# Patient Record
Sex: Male | Born: 1935
Health system: Southern US, Community
[De-identification: ages and names within clinical notes are randomized; demographics above are authoritative.]

## PROBLEM LIST (undated history)

## (undated) DIAGNOSIS — IMO0001 Reserved for inherently not codable concepts without codable children: Secondary | ICD-10-CM

## (undated) DIAGNOSIS — K219 Gastro-esophageal reflux disease without esophagitis: Secondary | ICD-10-CM

## (undated) DIAGNOSIS — K648 Other hemorrhoids: Secondary | ICD-10-CM

## (undated) DIAGNOSIS — R351 Nocturia: Secondary | ICD-10-CM

## (undated) DIAGNOSIS — H6120 Impacted cerumen, unspecified ear: Secondary | ICD-10-CM

## (undated) DIAGNOSIS — K120 Recurrent oral aphthae: Secondary | ICD-10-CM

## (undated) DIAGNOSIS — Z87898 Personal history of other specified conditions: Secondary | ICD-10-CM

## (undated) DIAGNOSIS — K130 Diseases of lips: Secondary | ICD-10-CM

## (undated) DIAGNOSIS — G4730 Sleep apnea, unspecified: Secondary | ICD-10-CM

## (undated) DIAGNOSIS — Z8619 Personal history of other infectious and parasitic diseases: Secondary | ICD-10-CM

## (undated) DIAGNOSIS — Z8719 Personal history of other diseases of the digestive system: Secondary | ICD-10-CM

## (undated) DIAGNOSIS — C801 Malignant (primary) neoplasm, unspecified: Secondary | ICD-10-CM

## (undated) DIAGNOSIS — M810 Age-related osteoporosis without current pathological fracture: Secondary | ICD-10-CM

## (undated) DIAGNOSIS — K5730 Diverticulosis of large intestine without perforation or abscess without bleeding: Secondary | ICD-10-CM

## (undated) DIAGNOSIS — Z86010 Personal history of colonic polyps: Secondary | ICD-10-CM

## (undated) DIAGNOSIS — E785 Hyperlipidemia, unspecified: Secondary | ICD-10-CM

## (undated) HISTORY — DX: Personal history of other specified conditions: Z87.898

## (undated) HISTORY — DX: Gastro-esophageal reflux disease without esophagitis: K21.9

## (undated) HISTORY — PX: SKIN CANCER EXCISION: SHX779

## (undated) HISTORY — DX: Sleep apnea, unspecified: G47.30

## (undated) HISTORY — DX: Personal history of colonic polyps: Z86.010

## (undated) HISTORY — DX: Other hemorrhoids: K64.8

## (undated) HISTORY — DX: Diseases of lips: K13.0

## (undated) HISTORY — PX: EYE SURGERY: SHX253

## (undated) HISTORY — PX: CATARACT EXTRACTION, BILATERAL: SHX1313

## (undated) HISTORY — DX: Hyperlipidemia, unspecified: E78.5

## (undated) HISTORY — DX: Age-related osteoporosis without current pathological fracture: M81.0

## (undated) HISTORY — DX: Personal history of other diseases of the digestive system: Z87.19

## (undated) HISTORY — DX: Diverticulosis of large intestine without perforation or abscess without bleeding: K57.30

## (undated) HISTORY — DX: Impacted cerumen, unspecified ear: H61.20

## (undated) HISTORY — DX: Recurrent oral aphthae: K12.0

## (undated) HISTORY — PX: COLONOSCOPY: SHX174

---

## 1992-05-25 ENCOUNTER — Encounter: Payer: Self-pay | Admitting: Gastroenterology

## 1999-03-26 HISTORY — PX: HEMORRHOID SURGERY: SHX153

## 2000-02-15 ENCOUNTER — Encounter: Payer: Self-pay | Admitting: General Surgery

## 2000-02-19 ENCOUNTER — Ambulatory Visit (HOSPITAL_COMMUNITY): Admission: RE | Admit: 2000-02-19 | Discharge: 2000-02-19 | Payer: Self-pay | Admitting: General Surgery

## 2000-02-19 ENCOUNTER — Encounter (INDEPENDENT_AMBULATORY_CARE_PROVIDER_SITE_OTHER): Payer: Self-pay

## 2002-04-12 ENCOUNTER — Encounter: Payer: Self-pay | Admitting: Otolaryngology

## 2002-04-12 ENCOUNTER — Ambulatory Visit (HOSPITAL_COMMUNITY): Admission: RE | Admit: 2002-04-12 | Discharge: 2002-04-12 | Payer: Self-pay | Admitting: Otolaryngology

## 2003-11-18 ENCOUNTER — Ambulatory Visit (HOSPITAL_BASED_OUTPATIENT_CLINIC_OR_DEPARTMENT_OTHER): Admission: RE | Admit: 2003-11-18 | Discharge: 2003-11-18 | Payer: Self-pay | Admitting: Family Medicine

## 2005-02-28 ENCOUNTER — Other Ambulatory Visit: Admission: RE | Admit: 2005-02-28 | Discharge: 2005-02-28 | Payer: Self-pay | Admitting: Dermatology

## 2006-08-25 ENCOUNTER — Ambulatory Visit: Payer: Self-pay | Admitting: Gastroenterology

## 2006-08-25 LAB — CONVERTED CEMR LAB
Ferritin: 130.3 ng/mL (ref 22.0–322.0)
Folate: >20 ng/mL
IgA: 99 mg/dL (ref 68–378)
Iron: 74 ug/dL (ref 42–165)
Saturation Ratios: 27.7 % (ref 20.0–50.0)
Tissue Transglutaminase Ab, IgA: <3 units (ref <5)
Transferrin: 190.7 mg/dL — ABNORMAL LOW (ref >=212.0)
Vitamin B-12: 592 pg/mL (ref 211–911)

## 2006-08-28 ENCOUNTER — Encounter: Payer: Self-pay | Admitting: Gastroenterology

## 2006-08-28 ENCOUNTER — Ambulatory Visit: Payer: Self-pay | Admitting: Gastroenterology

## 2006-08-28 DIAGNOSIS — K648 Other hemorrhoids: Secondary | ICD-10-CM | POA: Insufficient documentation

## 2006-08-28 DIAGNOSIS — K5730 Diverticulosis of large intestine without perforation or abscess without bleeding: Secondary | ICD-10-CM

## 2006-08-28 HISTORY — DX: Other hemorrhoids: K64.8

## 2006-08-28 HISTORY — DX: Diverticulosis of large intestine without perforation or abscess without bleeding: K57.30

## 2007-08-21 DIAGNOSIS — M810 Age-related osteoporosis without current pathological fracture: Secondary | ICD-10-CM

## 2007-08-21 DIAGNOSIS — Z8719 Personal history of other diseases of the digestive system: Secondary | ICD-10-CM

## 2007-08-21 DIAGNOSIS — G4730 Sleep apnea, unspecified: Secondary | ICD-10-CM

## 2007-08-21 DIAGNOSIS — Z87898 Personal history of other specified conditions: Secondary | ICD-10-CM

## 2007-08-21 DIAGNOSIS — N400 Enlarged prostate without lower urinary tract symptoms: Secondary | ICD-10-CM

## 2007-08-21 HISTORY — DX: Personal history of other specified conditions: Z87.898

## 2007-08-21 HISTORY — DX: Sleep apnea, unspecified: G47.30

## 2007-08-21 HISTORY — DX: Age-related osteoporosis without current pathological fracture: M81.0

## 2007-08-21 HISTORY — DX: Personal history of other diseases of the digestive system: Z87.19

## 2008-05-02 ENCOUNTER — Encounter: Admission: RE | Admit: 2008-05-02 | Discharge: 2008-05-02 | Payer: Self-pay | Admitting: Family Medicine

## 2008-05-04 ENCOUNTER — Encounter: Payer: Self-pay | Admitting: Family Medicine

## 2008-12-02 ENCOUNTER — Ambulatory Visit: Payer: Self-pay | Admitting: Family Medicine

## 2008-12-02 DIAGNOSIS — Z860100 Personal history of colon polyps, unspecified: Secondary | ICD-10-CM

## 2008-12-02 DIAGNOSIS — K219 Gastro-esophageal reflux disease without esophagitis: Secondary | ICD-10-CM

## 2008-12-02 HISTORY — DX: Personal history of colonic polyps: Z86.010

## 2008-12-02 HISTORY — DX: Gastro-esophageal reflux disease without esophagitis: K21.9

## 2008-12-02 HISTORY — DX: Personal history of colon polyps, unspecified: Z86.0100

## 2009-02-06 ENCOUNTER — Telehealth: Payer: Self-pay | Admitting: Family Medicine

## 2009-02-06 ENCOUNTER — Encounter (INDEPENDENT_AMBULATORY_CARE_PROVIDER_SITE_OTHER): Payer: Self-pay | Admitting: *Deleted

## 2009-04-11 ENCOUNTER — Ambulatory Visit: Payer: Self-pay | Admitting: Family Medicine

## 2009-04-11 DIAGNOSIS — K130 Diseases of lips: Secondary | ICD-10-CM | POA: Insufficient documentation

## 2009-04-11 DIAGNOSIS — H6120 Impacted cerumen, unspecified ear: Secondary | ICD-10-CM

## 2009-04-11 DIAGNOSIS — E785 Hyperlipidemia, unspecified: Secondary | ICD-10-CM | POA: Insufficient documentation

## 2009-04-11 HISTORY — DX: Hyperlipidemia, unspecified: E78.5

## 2009-04-11 HISTORY — DX: Diseases of lips: K13.0

## 2009-04-11 HISTORY — DX: Impacted cerumen, unspecified ear: H61.20

## 2009-04-12 LAB — CONVERTED CEMR LAB
ALT: 17 units/L (ref 0–53)
AST: 21 units/L (ref 0–37)
Albumin: 4 g/dL (ref 3.5–5.2)
Alkaline Phosphatase: 40 units/L (ref 39–117)
BUN: 18 mg/dL (ref 6–23)
Bilirubin, Direct: 0.2 mg/dL (ref 0.0–0.3)
CO2: 28 meq/L (ref 19–32)
Calcium: 9 mg/dL (ref 8.4–10.5)
Chloride: 109 meq/L (ref 96–112)
Cholesterol: 110 mg/dL (ref 0–200)
Creatinine, Ser: 1.1 mg/dL (ref 0.4–1.5)
GFR calc non Af Amer: 68.71 mL/min (ref >60)
Glucose, Bld: 78 mg/dL (ref 70–99)
HDL: 31.7 mg/dL — ABNORMAL LOW (ref >39.00)
LDL Cholesterol: 73 mg/dL (ref 0–99)
Potassium: 4 meq/L (ref 3.5–5.1)
Sodium: 142 meq/L (ref 135–145)
Total Bilirubin: 1.1 mg/dL (ref 0.3–1.2)
Total CHOL/HDL Ratio: 3
Total Protein: 6.5 g/dL (ref 6.0–8.3)
Triglycerides: 26 mg/dL (ref 0.0–149.0)
VLDL: 5.2 mg/dL (ref 0.0–40.0)

## 2009-07-24 ENCOUNTER — Telehealth: Payer: Self-pay | Admitting: Family Medicine

## 2009-08-28 ENCOUNTER — Encounter: Payer: Self-pay | Admitting: Family Medicine

## 2009-12-15 ENCOUNTER — Encounter: Payer: Self-pay | Admitting: Family Medicine

## 2009-12-19 ENCOUNTER — Telehealth: Payer: Self-pay | Admitting: Family Medicine

## 2009-12-19 ENCOUNTER — Ambulatory Visit: Payer: Self-pay | Admitting: Family Medicine

## 2009-12-19 DIAGNOSIS — K120 Recurrent oral aphthae: Secondary | ICD-10-CM

## 2009-12-19 HISTORY — DX: Recurrent oral aphthae: K12.0

## 2010-04-26 NOTE — Progress Notes (Signed)
Summary: Pt req script for Omeprazole 20mg  to [facility]  Phone Note Refill Request Call back at Home Phone [phone] Message from:  Patient on December 19, 2009 8:57 AM  Refills Requested: Medication #1:  OMEPRAZOLE 20 MG CPDR once daily   Dosage confirmed as above?Dosage Confirmed   Supply Requested: 3 months Pls call in to [facility] mail order.    Method Requested: Telephone to [facility] Initial call taken by: Lucy Antigua,  December 19, 2009 8:57 AM  Follow-up for Phone Call        Rx sent, pt informed Follow-up by: Sid Falcon LPN,  December 19, 2009 11:12 AM    Prescriptions: OMEPRAZOLE 20 MG CPDR (OMEPRAZOLE) once daily  #90 x 3   Entered by:   Sid Falcon LPN   Authorized by:   Evelena Peat MD   Signed by:   Sid Falcon LPN on 28/41/3244   Method used:   Faxed to ...       [facility] (mail-order)             , [place]         Ph: [phone]       Fax: [phone]   RxID:   [id number]

## 2010-04-26 NOTE — Assessment & Plan Note (Signed)
Summary: soreness in mouth/tender and roughness on lips and gums/cjr   Vital Signs:  Patient profile:   77 year old male Weight:      140 pounds Temp:     98.3 degrees F oral BP sitting:   120 / 60  (left arm) Cuff size:   regular  Vitals Entered By: Sid Falcon LPN (December 19, 2009 11:42 AM)  History of Present Illness: Patient seen with a small ulcers inner lower lip noted about 2 weeks ago. Had similar sores in the past. No history of tobacco use. No pain with swallowing. Symptoms are mild-to-moderate. Using antiseptic mouthwash without much improvement.  History of GERD controlled with omeprazole. Medication just refilled. History of BPH followed by urologist. No recent dysphagia.   Appetite and weight stable.  Patient has history of osteoporosis. Reportedly had repeat DEXA several months ago. We do not have any records of this time. Remains on alendronate, calcium, and vitamin D.  No hx of fall or fracture. Very steady on feet and low risk for fall.  Allergies (verified): No Known Drug Allergies  Past History:  Past Medical History: Last updated: 12/02/2008 Current Problems:  COLITIS, HX OF (ICD-V12.79) INTERNAL HEMORRHOIDS (ICD-455.0) DIVERTICULOSIS, COLON (ICD-562.10) OSTEOPOROSIS (ICD-733.00) BENIGN PROSTATIC HYPERTROPHY, HX OF (ICD-V13.8) SLEEP APNEA (ICD-780.57) Colonic polyps, hx of GERD Squamous Cell Skin Cancer left thigh.  Social History: Last updated: 12/02/2008 Retired Married Never Smoked Alcohol use-no Regular exercise-yes PMH reviewed for relevance, SH/Risk Factors reviewed for relevance  Review of Systems  The patient denies anorexia, fever, weight loss, chest pain, abdominal pain, melena, hematochezia, severe indigestion/heartburn, and muscle weakness.    Physical Exam  General:  Well-developed,well-nourished,in no acute distress; alert,appropriate and cooperative throughout examination Ears:  External ear exam shows no significant  lesions or deformities.  Otoscopic examination reveals clear canals, tympanic membranes are intact bilaterally without bulging, retraction, inflammation or discharge. Hearing is grossly normal bilaterally. Mouth:  small aphthous ulcer inner lower lip on the right side , oropharynx otherwise clear. Neck:  No deformities, masses, or tenderness noted. Lungs:  Normal respiratory effort, chest expands symmetrically. Lungs are clear to auscultation, no crackles or wheezes. Heart:  normal rate and regular rhythm.   Extremities:  No clubbing, cyanosis, edema, or deformity noted with normal full range of motion of all joints.   Cervical Nodes:  No lymphadenopathy noted   Impression & Recommendations:  Problem # 1:  APHTHOUS ULCERS (ICD-528.2) Assessment New salt water gargles.  Should resolve soon.  No concerning lesions such as leuko or erythroplakia.  Problem # 2:  GERD (ICD-530.81)  His updated medication list for this problem includes:    Omeprazole 20 Mg Cpdr (Omeprazole) ..... Once daily  Problem # 3:  OSTEOPOROSIS (ICD-733.00)  His updated medication list for this problem includes:    Alendronate Sodium 70 Mg Tabs (Alendronate sodium) ..... One tab weekly  Problem # 4:  BENIGN PROSTATIC HYPERTROPHY, HX OF (ICD-V13.8)  Problem # 5:  ROUTINE GENERAL MEDICAL EXAM@HEALTH  CARE FACL (ICD-V70.0) flu vaccine given.  Complete Medication List: 1)  Omeprazole 20 Mg Cpdr (Omeprazole) .... Once daily 2)  Alendronate Sodium 70 Mg Tabs (Alendronate sodium) .... One tab weekly 3)  Doxazosin Mesylate 8 Mg Tabs (Doxazosin mesylate) .... Once daily 4)  Avodart 0.5 Mg Caps (Dutasteride) .... Once daily 5)  Pro-biotic Blend Caps (Probiotic product) .... Once daily 6)  Lotrisone 1-0.05 % Crea (Clotrimazole-betamethasone) .... Apply to affected rash two times a day as needed  Other Orders: Flu  Vaccine 38yrs + MEDICARE PATIENTS (U2725) Administration Flu vaccine - MCR (D6644)  Patient  Instructions: 1)  You have aphthous ulcers which are thought to be caused by viruses. 2)  Use salt water gargles 3)  Please schedule a follow-up appointment in 6 months .     Flu Vaccine Consent Questions     Do you have a history of severe allergic reactions to this vaccine? no    Any prior history of allergic reactions to egg and/or gelatin? no    Do you have a sensitivity to the preservative Thimersol? no    Do you have a past history of Guillan-Barre Syndrome? no    Do you currently have an acute febrile illness? no    Have you ever had a severe reaction to latex? no    Vaccine information given and explained to patient? yes    Are you currently pregnant? no    Lot Number:[id number]   Exp Date:09/22/2010   Site Given  Left Deltoid IMdflu

## 2010-04-26 NOTE — Assessment & Plan Note (Signed)
Summary: James Hardy WILL COME IN FASTING // RS   Vital Signs:  Patient profile:   77 year old male Height:      64 inches Weight:      137 pounds Temp:     98.5 degrees F oral Pulse rate:   80 / minute Pulse rhythm:   regular Resp:     12 per minute BP sitting:   120 / 68  (left arm) Cuff size:   regular  Vitals Entered By: Sid Falcon LPN (April 11, 2009 9:22 AM) CC:  James Hardy fasting for labs   History of Present Illness: Patient here for evaluation of several issues.  History of GERD which is actually improved recently with some dietary changes. Had some mild weight loss due to his efforts. Has recently reduced omeprazole and is doing well.  History osteoporosis. On alendronate 70 mg weekly. Takes regularly calcium and vitamin D. No recent falls or fracture.  History of BPH. On Avodart and doxazosin and these have helped. Sees urologist yearly. Has decided against any further PSA.  History of mild hyperlipidemia. No history of CAD. Denies any dyspnea, dizziness, or chest pain with exercise.  Patient has rash left corner of mouth with small fissure which is sore has been present for a couple of months. No dentures.  Preventative issues addressed. Flu vaccine in October. Tetanus booster 2005. Prior Pneumovax within the past 5 years. Colonoscopy approximately one year ago  Allergies (verified): No Known Drug Allergies  Past History:  Past Medical History: Last updated: 12/02/2008 Current Problems:  COLITIS, HX OF (ICD-V12.79) INTERNAL HEMORRHOIDS (ICD-455.0) DIVERTICULOSIS, COLON (ICD-562.10) OSTEOPOROSIS (ICD-733.00) BENIGN PROSTATIC HYPERTROPHY, HX OF (ICD-V13.8) SLEEP APNEA (ICD-780.57) Colonic polyps, hx of GERD Squamous Cell Skin Cancer left thigh.  Past Surgical History: Last updated: 08/21/2007 Hemorrhoidectomy (2001)  Family History: Last updated: 12/02/2008 Family history heart disease  Social History: Last updated: 12/02/2008 Retired Married Never  Smoked Alcohol use-no Regular exercise-yes  Risk Factors: Exercise: yes (12/02/2008)  Risk Factors: Smoking Status: never (12/02/2008)  Review of Systems       The patient complains of decreased hearing.  The patient denies anorexia, fever, weight gain, vision loss, hoarseness, chest pain, syncope, dyspnea on exertion, peripheral edema, prolonged cough, headaches, hemoptysis, abdominal pain, melena, hematochezia, severe indigestion/heartburn, hematuria, incontinence, genital sores, muscle weakness, suspicious skin lesions, depression, enlarged lymph nodes, and testicular masses.    Physical Exam  General:  Well-developed,well-nourished,in no acute distress; alert,appropriate and cooperative throughout examination Eyes:  No corneal or conjunctival inflammation noted. EOMI. Perrla. Funduscopic exam benign, without hemorrhages, exudates or papilledema. Vision grossly normal. Ears:  bilateral cerumen impaction Nose:  External nasal examination shows no deformity or inflammation. Nasal mucosa are pink and moist without lesions or exudates. Mouth:  Oral mucosa and oropharynx without lesions or exudates.  Teeth in good repair.  Small fissure L corner of mouth. Neck:  No deformities, masses, or tenderness noted. Lungs:  Normal respiratory effort, chest expands symmetrically. Lungs are clear to auscultation, no crackles or wheezes. Heart:  Normal rate and regular rhythm. S1 and S2 normal without gallop, murmur, click, rub or other extra sounds. Abdomen:  Bowel sounds positive,abdomen soft and non-tender without masses, organomegaly or hernias noted. Genitalia:  per her urologist Prostate:  per urologist Msk:  No deformity or scoliosis noted of thoracic or lumbar spine.   Extremities:  No clubbing, cyanosis, edema, or deformity noted with normal full range of motion of all joints.   Neurologic:  No cranial nerve deficits  noted. Station and gait are normal. Plantar reflexes are down-going  bilaterally. DTRs are symmetrical throughout. Sensory, motor and coordinative functions appear intact. Skin:  Intact without suspicious lesions or rashes Cervical Nodes:  No lymphadenopathy noted Psych:  Oriented X3, normally interactive, good eye contact, not anxious appearing, and not depressed appearing.     Impression & Recommendations:  Problem # 1:  GERD (ICD-530.81) Assessment Improved Will try to start leaving off Omeprazole. His updated medication list for this problem includes:    Omeprazole 20 Mg Cpdr (Omeprazole) ..... Once daily  Problem # 2:  OSTEOPOROSIS (ICD-733.00)  His updated medication list for this problem includes:    Alendronate Sodium 70 Mg Tabs (Alendronate sodium) ..... One tab weekly  Problem # 3:  BENIGN PROSTATIC HYPERTROPHY, HX OF (ICD-V13.8) Cont Avodart.   Problem # 4:  ANGULAR CHEILITIS (ICD-528.5) Lotrisone cream to use two times a day.  Problem # 5:  CERUMEN IMPACTION (ICD-380.4) both ears irrigated.  Large plug removed L ear.  Some residual R ear.  Problem # 6:  HYPERLIPIDEMIA (ICD-272.4)  Orders: TLB-Lipid Panel (80061-LIPID) TLB-Hepatic/Liver Function Pnl (80076-HEPATIC) Venipuncture (16109)  Complete Medication List: 1)  Omeprazole 20 Mg Cpdr (Omeprazole) .... Once daily 2)  Alendronate Sodium 70 Mg Tabs (Alendronate sodium) .... One tab weekly 3)  Doxazosin Mesylate 8 Mg Tabs (Doxazosin mesylate) .... Once daily 4)  Avodart 0.5 Mg Caps (Dutasteride) .... Once daily 5)  Pro-biotic Blend Caps (Probiotic product) .... Once daily 6)  Lotrisone 1-0.05 % Crea (Clotrimazole-betamethasone) .... Apply to affected rash two times a day as needed  Other Orders: TLB-BMP (Basic Metabolic Panel-BMET) (80048-METABOL) Prescriptions: LOTRISONE 1-0.05 % CREA (CLOTRIMAZOLE-BETAMETHASONE) apply to affected rash two times a day as needed  #15 gm x 1   Entered and Authorized by:   Evelena Peat MD   Signed by:   Evelena Peat MD on 04/11/2009    Method used:   Electronically to        [name] [id number]* (retail)       [address]       [place], [place]  [address]       Ph: [phone] or [phone]       Fax: [phone]   RxID:   [id number]    Immunization History:  Influenza Immunization History:    Influenza:  historical (12/23/2008)

## 2010-04-26 NOTE — Progress Notes (Signed)
Summary: Pt req refill of Alendronate Sodium 90day supply to [facility]  Phone Note Call from Patient Call back at Home Phone [phone]   Caller: Patient Summary of Call: Pt called to get refill of Alendronate Sodium 90 day supply to [facility]  [phone]        Initial call taken by: Lucy Antigua,  Jul 24, 2009 9:34 AM  Follow-up for Phone Call        Rx sent to [facility], pt informed on home phone Follow-up by: Sid Falcon LPN,  Jul 24, 9560 4:13 PM    Prescriptions: ALENDRONATE SODIUM 70 MG TABS (ALENDRONATE SODIUM) one tab weekly  #12 x 3   Entered by:   Sid Falcon LPN   Authorized by:   Evelena Peat MD   Signed by:   Sid Falcon LPN on 13/10/6576   Method used:   Electronically to        [facility]* (mail-order)             ,          Ph: [phone]       Fax: [phone]   RxID:   [id number]

## 2010-04-26 NOTE — Medication Information (Signed)
Summary: Order for Medical Supplies  Order for Medical Supplies   Imported By: Maryln Gottron 12/19/2009 14:31:48  _____________________________________________________________________  External Attachment:    Type:   Image     Comment:   External Document

## 2010-08-07 NOTE — Assessment & Plan Note (Signed)
[facility]                         GASTROENTEROLOGY OFFICE NOTE   BARTH, TRELLA                        MRN:          [id number]  DATE:08/25/2006                            DOB:          18-May-1928    REFERRING PHYSICIAN:  Evelena Peat, M.D.   REASON FOR REFERRAL:  Anemia and intermittent diarrhea.   HISTORY OF PRESENT ILLNESS:  Mr. Bunn is a very nice 77 year old, white  male who I have evaluated in the past. Unfortunately his chart is not  available at the time of this dictation. He previously underwent  colonoscopy in 2000. He states he may have had some colon polyps. He has  had intermittent diarrhea over the years and these symptoms persist. He  has no clear triggers for his diarrhea. He denies lactose intolerance or  any other specific food intolerances. He has chronic GERD that is well  controlled on Nexium. He is status post hemorrhoidectomy by Dr. Kendrick Ranch in November 2001. There is no family history of colon cancer,  colon polyps or inflammatory bowel disease. He recently saw Dr.  Caryl Never and blood work was performed on May 29 that showed a  hemoglobin of 12.1 with an MCV of 98. The patient states that recent  stool Hemoccult tests were negative although I do not have a written  record of that report. He notes no change in bowel habits, melena,  hematochezia, change in stool caliber, abdominal pain or weight loss.   PAST MEDICAL HISTORY:  Osteoporosis, BPH, sleep apnea, status post  hemorrhoidectomy.   CURRENT MEDICATIONS:  Listed on the chart updated and reviewed.   MEDICATION ALLERGIES:  None known.   SOCIAL HISTORY:  Per the handwritten form.   REVIEW OF SYSTEMS:  Per the handwritten form.   PHYSICAL EXAMINATION:  GENERAL:  Well-developed, well-nourished, white  male in no acute distress.  VITAL SIGNS:  Height 5 feet 4.5 inches, weight 139 pounds, blood  pressure is 120/64, pulse 56 and regular.  HEENT:  Anicteric  sclera, oropharynx clear.  CHEST:  Clear to auscultation bilaterally.  CARDIAC:  Regular rate and rhythm without murmurs appreciated.  ABDOMEN:  Soft, nontender, nondistended. Normal active bowel sounds. No  palpable organomegaly, masses or hernias.  RECTAL:  Deferred to time of colonoscopy.  EXTREMITIES:  Without clubbing, cyanosis or edema.  NEUROLOGIC:  Alert and oriented x3. Grossly nonfocal.   ASSESSMENT/PLAN:  Normocytic anemia and chronic intermittent diarrhea.  Will obtain an iron, TIBC, ferritin, B12 and folate today. Obtain a  tissue transglutaminase and IgA today. Review the [facility]  Gastroenterology records when his chart arrives. Consider a trial of  antibiotics for possible bacterial overgrowth. Need to exclude  colorectal neoplasms, inflammatory bowel disease, and microscopic  colitis. The risks, benefits, and alternatives to colonoscopy with  possible biopsy and possible polypectomy discussed with the patient and  he consents to proceed. This will be scheduled electively.     Venita Lick. Russella Dar, MD, [facility]  Electronically Signed    MTS/MedQ  DD: 08/25/2006  DT: 08/25/2006  Job #: [id number]   cc:   Evelena Peat,  M.D. 

## 2010-08-10 NOTE — Op Note (Signed)
[facility]  Patient:    James Hardy, James Hardy                        MRN: [id number] Proc. Date: 02/19/00 Adm. Date:  [id number] Attending:  Carson Myrtle                           Operative Report  PREOPERATIVE DIAGNOSIS:  Internal and external hemorrhoids.  POSTOPERATIVE DIAGNOSIS:  Internal and external hemorrhoids.  PROCEDURE:  Complex hemorrhoidectomy.  SURGEON:  Timothy E. Earlene Plater, M.D.  ANESTHESIA:  General.  INDICATIONS:  The patient has had hemorrhoids for years and had gotten worse over time.  In spite of conservative management, he now has prolapse, soilage, bleeding, and discomfort.  He wishes to proceed with surgery and has been carefully discussed.  DESCRIPTION OF PROCEDURE:  The patient was brought to the operating room and spinal anesthesia provided.  Placed in lithotomy.  Lifted to lithotomy position.  Perianal area inspected, prepped and draped in the usual fashion. Hemorrhoids were prominent in the left lateral, right posterior, and posterior position.  Right anterior position was not effected.  The anus was injected around and about with 0.25% Marcaine with epinephrine and mixed 9:1 with Wydase and massaged in well.  The anus was again carefully inspected and each of the hemorrhoidal complexes were separately identified, and then excised as an ellipse with careful undermining of the edges to remove the superficial varicosities and to remove only the slightest necessary amount of anoderm. Each wound was closed with a running 2-0 chromic suture.  The posterior hemorrhoid was in a difficult location, but I was able to remove it as a skinny ellipse, undermining of the edges, and then careful closure. This was accomplished.  There was no bleeding or complication.  He tolerated it well.  A Gelfoam gauze with dry sterile dressing applied.  He tolerated it well and was removed to the recovery room in good condition. Written and verbal  instructions were given to him and his wife, and he will be seen and followed as an outpatient. DD:  02/19/00 TD:  02/19/00 Job: [id number]

## 2010-08-10 NOTE — Procedures (Signed)
NAME:  James Hardy, James Hardy               ACCOUNT NO.:  [id number]   MEDICAL RECORD NO.:  [id number]          PATIENT TYPE:  OUT   LOCATION:  SLEEP CENTER                 FACILITY:  [facility]   PHYSICIAN:  Clinton D. Maple Hudson, M.D. DATE OF BIRTH:  [birth year]   DATE OF ADMISSION:  11/18/2003  DATE OF DISCHARGE:  11/18/2003                              NOCTURNAL POLYSOMNOGRAM   REFERRING PHYSICIAN:  Evelena Peat, M.D.   INDICATION FOR STUDY:  Hypersomnia with sleep apnea.  Previously diagnosed  with obstructive sleep apnea and using CPAP, now needing re-evaluation.  Epworth sleepiness score 18/24.  BMI 22.4, weight 140 pounds.   MEDICATIONS:  1.  Fosamax 150 mg.  2.  Cardura 4 mg.  3.  Tylenol.   SLEEP ARCHITECTURE:  Total sleep time 276 minutes with sleep efficiency 78%.  Stage 1 was 13%, stage 2 55%, stages 3 and 4 16%.  REM was 16% of total  sleep time.  Latency to sleep onset two minutes.  Latency to REM 238  minutes.  Awake after sleep onset 18 minutes.  Arousal index 36.   RESPIRATORY DATA:  Split study protocol.  RDI 58.8 per hour before CPAP  consistent with severe obstructive sleep apnea/hypopnea syndrome.  This  included 76 obstructive apneas, one central apnea, and 44 hypopneas before  CPAP.  He slept only supine.  REM RDI was 2.7 per hour.  CPAP was titrated  to 8 CWP, RDI 1.3 per hour using a small comfort gel mask with heated  humidifier.   OXYGEN DATA:  Occasionally loud snoring.  Mild to moderate oxygen  desaturation up to 85% with events before CPAP.  After CPAP control, oxygen  saturation held 97-98%.   CARDIAC DATA:  Normal cardiac rhythm.   MOVEMENT/PARASOMNIA:  Bathroom x 3.  The patient states he usually goes  every hour.  Occasional limb jerks with insignificant effect on sleep.   IMPRESSION/RECOMMENDATION:  Severe obstructive sleep apnea/hypopnea  syndrome, respiratory disturbance index 58.8 per hour.  Good control with  continuous positive airway pressure with  at 8 centimeters of  water pressure, respiratory disturbance index 1.3 per hour, using a small  comfort gel mask with heated humidifier.  Mild oxygen desaturation corrected  by continuous positive airway pressure.  Normal cardiac rhythm.                                   ______________________________                                Rennis Chris. Maple Hudson, M.D.                                Diplomate, American Board of Sleep Medicine    CDY/MEDQ  D:  11/27/2003 09:53:31  T:  11/28/2003 13:02:12  Job:  [id number]

## 2010-12-07 ENCOUNTER — Other Ambulatory Visit: Payer: Self-pay | Admitting: Family Medicine

## 2011-06-11 ENCOUNTER — Ambulatory Visit (INDEPENDENT_AMBULATORY_CARE_PROVIDER_SITE_OTHER): Payer: Medicare Other | Admitting: Family Medicine

## 2011-06-11 ENCOUNTER — Encounter: Payer: Self-pay | Admitting: Family Medicine

## 2011-06-11 VITALS — BP 130/70 | HR 75 | Temp 97.8°F

## 2011-06-11 DIAGNOSIS — R490 Dysphonia: Secondary | ICD-10-CM

## 2011-06-11 DIAGNOSIS — H6120 Impacted cerumen, unspecified ear: Secondary | ICD-10-CM | POA: Diagnosis not present

## 2011-06-11 DIAGNOSIS — J329 Chronic sinusitis, unspecified: Secondary | ICD-10-CM

## 2011-06-11 DIAGNOSIS — J040 Acute laryngitis: Secondary | ICD-10-CM

## 2011-06-11 MED ORDER — AMOXICILLIN 875 MG PO TABS: 875.0000 mg | ORAL_TABLET | Freq: Two times a day (BID) | ORAL | Status: AC

## 2011-06-11 NOTE — Patient Instructions (Signed)
CAll me in 2 weeks if hoarseness not resolving following antibiotics.

## 2011-06-11 NOTE — Progress Notes (Signed)
  Subjective:    [name], male    DOB: Mar 09, 1929, 78 y.o.   MRN: [id number]  HPI  Patient seen with hoarseness for almost 4 weeks duration. Initially had cold-like symptoms but those symptoms have mostly cleared.   He does have some persistent yellowish nasal discharge bilaterally. No cough. Mild sore throat. He has a long history of GERD and is aware of occasional reflux but he takes Prilosec 20 mg daily regularly. Nonsmoker. No appetite or weight changes reported. No difficulty swallowing. No headaches.  Bilateral ear fullness. No vertigo. No ear drainage. Denies ear pain.   Review of Systems  Constitutional: Negative for fever and chills.  HENT: Positive for sore throat and voice change. Negative for ear pain, trouble swallowing and ear discharge.   Respiratory: Negative for cough, shortness of breath and wheezing.        Objective:   Physical Exam  Constitutional: He appears well-developed and well-nourished.  HENT:       Bilateral cerumen impactions. Oropharynx is clear  Neck: Neck supple. No thyromegaly present.  Cardiovascular: Normal rate and regular rhythm.   Pulmonary/Chest: Effort normal and breath sounds normal. No respiratory distress. He has no wheezes. He has no rales.  Musculoskeletal: He exhibits no edema.  Lymphadenopathy:    He has no cervical adenopathy.          Assessment & Plan:  #1 hoarseness. Differential is post viral, sinusitis, GERD related, versus other. He has persistent yellowish nasal discharge. Treat with amoxicillin 875 mg twice daily for 10 days. ENT referral if not fully resolving in 2 weeks. Continue Prilosec and elevate head of bed 6-8 inches #2 bilateral cerumen impaction. Irrigation of both ears

## 2011-06-14 ENCOUNTER — Encounter: Payer: Self-pay | Admitting: Family Medicine

## 2011-06-14 ENCOUNTER — Ambulatory Visit (INDEPENDENT_AMBULATORY_CARE_PROVIDER_SITE_OTHER): Payer: Medicare Other | Admitting: Family Medicine

## 2011-06-14 VITALS — BP 130/72 | Temp 97.6°F | Wt 142.0 lb

## 2011-06-14 DIAGNOSIS — T148XXA Other injury of unspecified body region, initial encounter: Secondary | ICD-10-CM

## 2011-06-14 DIAGNOSIS — W57XXXA Bitten or stung by nonvenomous insect and other nonvenomous arthropods, initial encounter: Secondary | ICD-10-CM

## 2011-06-14 NOTE — Progress Notes (Signed)
  Subjective:    [name], male    DOB: 1928-07-05, 78 y.o.   MRN: [id number]  HPI  Patient seen with small "bump" scrotal area.  He first noticed yesterday. He tried to pull at this but had some bleeding. He's not had any fever, chills, skin rash, headache. He's had several weeks of some hoarseness and is currently on antibiotic. He thinks his hoarseness may be slightly improved since starting amoxicillin. No active reflux symptoms   Review of Systems  Constitutional: Negative for fever and chills.  Musculoskeletal: Negative for arthralgias.  Skin: Negative for rash.  Neurological: Negative for headaches.  Hematological: Negative for adenopathy.       Objective:   Physical Exam  Constitutional: He appears well-developed and well-nourished.  Cardiovascular: Normal rate and regular rhythm.   Pulmonary/Chest: Effort normal and breath sounds normal. No respiratory distress. He has no wheezes. He has no rales.  Skin:       A magnification, patient has small deer tick embedded scrotum. After prepping with alcohol this was carefully removed intact. Minimal bleeding.          Assessment & Plan:  Tick bite scrotal area. Removed without difficulty. Reviewed signs and symptoms of lyme disease. Antibiotic applied to skin. Follow up promptly for any rash, fever, arthralgias, or any other concerning symptoms

## 2011-06-14 NOTE — Patient Instructions (Signed)
Wood Tick Bite Ticks are insects that attach themselves to the skin. Most tick bites are harmless, but sometimes ticks carry diseases that can make a person quite ill. The chance of getting ill depends on:  The kind of tick that bites you.   Time of year.   How long the tick is attached.   Geographic location.  Wood ticks are also called dog ticks. They are generally black. They can have white markings. They live in shrubs and grassy areas. They are larger than deer ticks. Wood ticks are about the size of a watermelon seed. They have a hard body. The most common places for ticks to attach themselves are the scalp, neck, armpits, waist, and groin. Wood ticks may stay attached for up to 2 weeks. TICKS MUST BE REMOVED AS SOON AS POSSIBLE TO HELP PREVENT DISEASES CAUSED BY TICK BITES.  TO REMOVE A TICK: 1. If available, put on latex gloves before trying to remove a tick.  2. Grasp the tick as close to the skin as possible, with curved forceps, fine tweezers or a special tick removal tool.  3. Pull gently with steady pressure until the tick lets go. Do not twist the tick or jerk it suddenly. This may break off the tick's head or mouth parts.  4. Do not crush the tick's body. This could force disease-carrying fluids from the tick into your body.  5. After the tick is removed, wash the bite area and your hands with soap and water or other disinfectant.  6. Apply a small amount of antiseptic cream or ointment to the bite site.  7. Wash and disinfect any instruments that were used.  8. Save the tick in a jar or plastic bag for later identification. Preserve the tick with a bit of alcohol or put it in the freezer.  9. Do not apply a hot match, petroleum jelly, or fingernail polish to the tick. This does not work and may increase the chances of disease from the tick bite.  YOU MAY NEED TO SEE YOUR CAREGIVER FOR A TETANUS SHOT NOW IF:  You have no idea when you had the last one.   You have never had a  tetanus shot before.  If you need a tetanus shot, and you decide not to get one, there is a rare chance of getting tetanus. Sickness from tetanus can be serious. If you get a tetanus shot, your arm may swell, get red and warm to the touch at the shot site. This is common and not a problem. PREVENTION  Wear protective clothing. Long sleeves and pants are best.   Wear white clothes to see ticks more easily   Tuck your pant legs into your socks.   If walking on trail, stay in the middle of the trail to avoid brushing against bushes.   Put insect repellent on all exposed skin and along boot tops, pant legs and sleeve cuffs   Check clothing, hair and skin repeatedly and before coming inside.   Brush off any ticks that are not attached.  SEEK MEDICAL CARE IF:   You cannot remove a tick or part of the tick that is left in the skin.   Unexplained fever.   Redness and swelling in the area of the tick bite.   Tender, swollen lymph glands.   Diarrhea.   Weight loss.   Cough.   Fatigue.   Muscle, joint or bone pain.   Belly pain.   Headache.   Rash.    SEEK IMMEDIATE MEDICAL CARE IF:   You develop an oral temperature above 102 F (38.9 C).   You are having trouble walking or moving your legs.   Numbness in the legs.   Shortness of breath.   Confusion.   Repeated vomiting.  Document Released: 03/08/2000 Document Revised: 02/28/2011 Document Reviewed: 02/15/2008 Exit[employer] Patient Information 2012 [employer]. 

## 2011-06-20 ENCOUNTER — Ambulatory Visit (INDEPENDENT_AMBULATORY_CARE_PROVIDER_SITE_OTHER): Payer: BC Managed Care – PPO | Admitting: Otolaryngology

## 2011-07-03 ENCOUNTER — Telehealth: Payer: Self-pay | Admitting: *Deleted

## 2011-07-03 ENCOUNTER — Ambulatory Visit (INDEPENDENT_AMBULATORY_CARE_PROVIDER_SITE_OTHER): Payer: Medicare Other | Admitting: Family

## 2011-07-03 ENCOUNTER — Encounter: Payer: Self-pay | Admitting: Family

## 2011-07-03 VITALS — BP 120/60 | Temp 98.7°F | Wt 138.0 lb

## 2011-07-03 DIAGNOSIS — A084 Viral intestinal infection, unspecified: Secondary | ICD-10-CM

## 2011-07-03 DIAGNOSIS — R112 Nausea with vomiting, unspecified: Secondary | ICD-10-CM

## 2011-07-03 DIAGNOSIS — A09 Infectious gastroenteritis and colitis, unspecified: Secondary | ICD-10-CM

## 2011-07-03 DIAGNOSIS — R197 Diarrhea, unspecified: Secondary | ICD-10-CM

## 2011-07-03 LAB — CBC WITH DIFFERENTIAL/PLATELET
Basophils Absolute: 0 10*3/uL (ref 0.0–0.1)
HCT: 34 % — ABNORMAL LOW (ref 39.0–52.0)
Hemoglobin: 11.2 g/dL — ABNORMAL LOW (ref 13.0–17.0)
Lymphs Abs: 1.1 10*3/uL (ref 0.7–4.0)
MCHC: 33 g/dL (ref 30.0–36.0)
MCV: 101.1 fl — ABNORMAL HIGH (ref 78.0–100.0)
Monocytes Absolute: 0.8 10*3/uL (ref 0.1–1.0)
Monocytes Relative: 16.8 % — ABNORMAL HIGH (ref 3.0–12.0)
Neutro Abs: 2.8 10*3/uL (ref 1.4–7.7)
RDW: 14.8 % — ABNORMAL HIGH (ref 11.5–14.6)

## 2011-07-03 LAB — BASIC METABOLIC PANEL
CO2: 24 mEq/L (ref 19–32)
Chloride: 106 mEq/L (ref 96–112)
GFR: 83.97 mL/min (ref >60.00)
Glucose, Bld: 90 mg/dL (ref 70–99)
Potassium: 4.6 mEq/L (ref 3.5–5.1)
Sodium: 138 mEq/L (ref 135–145)

## 2011-07-03 NOTE — Telephone Encounter (Signed)
Per Dr Burchette, please schedule with any available provider 

## 2011-07-03 NOTE — Patient Instructions (Signed)
Viral Gastroenteritis Viral gastroenteritis is also known as stomach flu. This condition affects the stomach and intestinal tract. It can cause sudden diarrhea and vomiting. The illness typically lasts 3 to 8 days. Most people develop an immune response that eventually gets rid of the virus. While this natural response develops, the virus can make you quite ill. CAUSES  Many different viruses can cause gastroenteritis, such as rotavirus or noroviruses. You can catch one of these viruses by consuming contaminated food or water. You may also catch a virus by sharing utensils or other personal items with an infected person or by touching a contaminated surface. SYMPTOMS  The most common symptoms are diarrhea and vomiting. These problems can cause a severe loss of body fluids (dehydration) and a body salt (electrolyte) imbalance. Other symptoms may include:  Fever.   Headache.   Fatigue.   Abdominal pain.  DIAGNOSIS  Your caregiver can usually diagnose viral gastroenteritis based on your symptoms and a physical exam. A stool sample may also be taken to test for the presence of viruses or other infections. TREATMENT  This illness typically goes away on its own. Treatments are aimed at rehydration. The most serious cases of viral gastroenteritis involve vomiting so severely that you are not able to keep fluids down. In these cases, fluids must be given through an intravenous line (IV). HOME CARE INSTRUCTIONS   Drink enough fluids to keep your urine clear or pale yellow. Drink small amounts of fluids frequently and increase the amounts as tolerated.   Ask your caregiver for specific rehydration instructions.   Avoid:   Foods high in sugar.   Alcohol.   Carbonated drinks.   Tobacco.   Juice.   Caffeine drinks.   Extremely hot or cold fluids.   Fatty, greasy foods.   Too much intake of anything at one time.   Dairy products until 24 to 48 hours after diarrhea stops.   You may  consume probiotics. Probiotics are active cultures of beneficial bacteria. They may lessen the amount and number of diarrheal stools in adults. Probiotics can be found in yogurt with active cultures and in supplements.   Wash your hands well to avoid spreading the virus.   Only take over-the-counter or prescription medicines for pain, discomfort, or fever as directed by your caregiver. Do not give aspirin to children. Antidiarrheal medicines are not recommended.   Ask your caregiver if you should continue to take your regular prescribed and over-the-counter medicines.   Keep all follow-up appointments as directed by your caregiver.  SEEK IMMEDIATE MEDICAL CARE IF:   You are unable to keep fluids down.   You do not urinate at least once every 6 to 8 hours.   You develop shortness of breath.   You notice blood in your stool or vomit. This may look like coffee grounds.   You have abdominal pain that increases or is concentrated in one small area (localized).   You have persistent vomiting or diarrhea.   You have a fever.   The patient is a child younger than 3 months, and he or she has a fever.   The patient is a child older than 3 months, and he or she has a fever and persistent symptoms.   The patient is a child older than 3 months, and he or she has a fever and symptoms suddenly get worse.   The patient is a baby, and he or she has no tears when crying.  MAKE SURE YOU:     Understand these instructions.   Will watch your condition.   Will get help right away if you are not doing well or get worse.  Document Released: 03/11/2005 Document Revised: 02/28/2011 Document Reviewed: 12/26/2010 [employer] Patient Information 2012 [employer]. 

## 2011-07-03 NOTE — Progress Notes (Signed)
Subjective:    [name], male    DOB: [birth year], 78 y.o.   MRN: [id number]  HPI 78 year old white male, nonsmoker, patient of Dr. Caryl Never is in with complaints of vomiting, diarrhea, bloating and it's been going on for 6 days. Today he is better. Reports a lot more indigestion and burping since this has been going on. He currently takes Prilosec 20 mg a day. His wife has been ill with similar symptoms. Patient denies lightheadedness, dizziness, chest pain, palpitations, shortness of breath or edema.  Review of Systems  Constitutional: Negative.   HENT: Negative.   Respiratory: Negative.   Cardiovascular: Negative.   Gastrointestinal: Positive for nausea, vomiting and diarrhea. Negative for abdominal distention and rectal pain.  Genitourinary: Negative.   Musculoskeletal: Negative.   Skin: Negative.   Hematological: Negative.   Psychiatric/Behavioral: Negative.    Past Medical History  Diagnosis Date  . HYPERLIPIDEMIA 04/11/2009    Qualifier: Diagnosis of  By: Rita Ohara    . CERUMEN IMPACTION 04/11/2009    Qualifier: Diagnosis of  By: Rita Ohara    . INTERNAL HEMORRHOIDS 08/28/2006    Qualifier: Diagnosis of  By: Thereasa Solo    . APHTHOUS ULCERS 12/19/2009    Qualifier: Diagnosis of  By: Caryl Never MD, Bruce    . ANGULAR CHEILITIS 04/11/2009    Qualifier: Diagnosis of  By: Rita Ohara    . GERD 12/02/2008    Qualifier: Diagnosis of  By: Caryl Never MD, Bruce    . DIVERTICULOSIS, COLON 08/28/2006    Qualifier: Diagnosis of  By: Thereasa Solo    . OSTEOPOROSIS 08/21/2007    Qualifier: Diagnosis of  By: Thereasa Solo    . SLEEP APNEA 08/21/2007    Qualifier: Diagnosis of  By: Thereasa Solo    . COLONIC POLYPS, HX OF 12/02/2008    Qualifier: Diagnosis of  By: Caryl Never MD, Bruce    . COLITIS, HX OF 08/21/2007    Qualifier: Diagnosis of  By: Thereasa Solo    . BENIGN PROSTATIC HYPERTROPHY, HX OF 08/21/2007    Qualifier:  Diagnosis of  By: Thereasa Solo      History   Social History  . Marital Status: Married    Spouse Name: N/A    Number of Children: N/A  . Years of Education: N/A   Occupational History  . Not on file.   Social History Main Topics  . Smoking status: Never Smoker   . Smokeless tobacco: Not on file  . Alcohol Use: Not on file  . Drug Use: Not on file  . Sexually Active: Not on file   Other Topics Concern  . Not on file   Social History Narrative  . No narrative on file    Past Surgical History  Procedure Date  . Hemorrhoid surgery 2001    Family History  Problem Relation Age of Onset  . Heart disease Other     No Known Allergies  Current Outpatient Prescriptions on File Prior to Visit  Medication Sig Dispense Refill  . alendronate (FOSAMAX) 70 MG tablet Take 70 mg by mouth every 7 (seven) days. Take with a full glass of water on an empty stomach.      . clotrimazole-betamethasone (LOTRISONE) cream Apply topically 2 (two) times daily as needed.      . doxazosin (CARDURA) 8 MG tablet Take 8 mg by mouth at bedtime.      . dutasteride (AVODART) 0.5 MG capsule Take 0.5 mg by mouth daily.      Marland Kitchen  omeprazole (PRILOSEC) 20 MG capsule TAKE 1 CAPSULE ONCE DAILY  90 capsule  3  . Probiotic Product (PRO-BIOTIC BLEND) CAPS Take by mouth daily.        BP 120/60  Temp(Src) 98.7 F (37.1 C) (Oral)  Wt 138 lb (62.596 kg)chart    Objective:   Physical Exam  Constitutional: He is oriented to person, place, and time. He appears well-developed and well-nourished.  Neck: Normal range of motion. Neck supple.  Cardiovascular: Normal rate, regular rhythm and normal heart sounds.   Pulmonary/Chest: Effort normal and breath sounds normal.  Abdominal: Soft. Bowel sounds are normal. There is no tenderness. There is no rebound and no guarding.  Neurological: He is alert and oriented to person, place, and time.  Skin: Skin is warm and dry.  Psychiatric: He has a normal mood and  affect.          Assessment & Plan:   Assessment: Viral gastroenteritis, nausea, vomiting, GERD  Plan: Labs and to include BMP and CBC when the patient and the results. Bland diet, advance as tolerated. Call the office if symptoms worsen or persist. Increase omeprazole temporarily to twice a day x1 week. Recheck as scheduled and when necessary.

## 2011-07-03 NOTE — Telephone Encounter (Signed)
Pt coming in to see James Hardy

## 2011-07-03 NOTE — Telephone Encounter (Signed)
Pt has had a GI Virus for one week, and is continuing with diarrhea today and has already had 6-8 BMs this am.  He also has an extreme amount of bloating and feels lethargic.  Asking for advice. No fever.

## 2011-07-19 ENCOUNTER — Ambulatory Visit (INDEPENDENT_AMBULATORY_CARE_PROVIDER_SITE_OTHER): Payer: Medicare Other | Admitting: Family Medicine

## 2011-07-19 VITALS — BP 120/60 | Temp 97.9°F | Wt 142.0 lb

## 2011-07-19 DIAGNOSIS — W57XXXA Bitten or stung by nonvenomous insect and other nonvenomous arthropods, initial encounter: Secondary | ICD-10-CM

## 2011-07-19 DIAGNOSIS — IMO0001 Reserved for inherently not codable concepts without codable children: Secondary | ICD-10-CM

## 2011-07-19 DIAGNOSIS — H10029 Other mucopurulent conjunctivitis, unspecified eye: Secondary | ICD-10-CM

## 2011-07-19 DIAGNOSIS — T148XXA Other injury of unspecified body region, initial encounter: Secondary | ICD-10-CM

## 2011-07-19 DIAGNOSIS — M791 Myalgia, unspecified site: Secondary | ICD-10-CM

## 2011-07-19 DIAGNOSIS — T148 Other injury of unspecified body region: Secondary | ICD-10-CM | POA: Diagnosis not present

## 2011-07-19 DIAGNOSIS — H109 Unspecified conjunctivitis: Secondary | ICD-10-CM

## 2011-07-19 MED ORDER — DOXYCYCLINE HYCLATE 100 MG PO TABS: 100.0000 mg | ORAL_TABLET | Freq: Two times a day (BID) | ORAL | Status: AC

## 2011-07-19 MED ORDER — POLYMYXIN B-TRIMETHOPRIM 10000-0.1 UNIT/ML-% OP SOLN: 2.0000 [drp] | OPHTHALMIC | Status: AC

## 2011-07-19 NOTE — Patient Instructions (Signed)
Deer Tick Bite Deer ticks are brown arachnids (spider family) that vary in size from as small as the head of a pin to 1/4 inch (1/2 cm) diameter. They thrive in wooded areas. Deer are the preferred host of adult deer ticks. Small rodents are the host of young ticks (nymphs). When a person walks in a field or wooded area, young and adult ticks in the surrounding grass and vegetation can attach themselves to the skin. They can suck blood for hours to days if unnoticed. Ticks are found all over the [place] Some ticks carry a specific bacteria (Borrelia burgdorferi) that causes an infection called Lyme disease. The bacteria is typically passed into a person during the blood sucking process. This happens after the tick has been attached for at least a number of hours. While ticks can be found all over the [place] those carrying the bacteria that causes Lyme disease are most common in [place]. Only a small proportion of ticks in these areas carry the Lyme disease bacteria and cause human infections. Ticks usually attach to warm spots on the body, such as the:  Head.   Back.   Neck.   Armpits.   Groin.  SYMPTOMS  Most of the time, a deer tick bite will not be felt. You may or may not see the attached tick. You may notice mild irritation or redness around the bite site. If the deer tick passes the Lyme disease bacteria to a person, a round, red rash may be noticed 2 to 3 days after the bite. The rash may be clear in the middle, like a bull's-eye or target. If not treated, other symptoms may develop several days to weeks after the onset of the rash. These symptoms may include:  New rash lesions.   Fatigue and weakness.   General ill feeling and achiness.   Chills.   Headache and neck pain.   Swollen lymph glands.   Sore muscles and joints.  5 to 15% of untreated people with Lyme disease may develop more severe illnesses after several weeks to months. This may include inflammation  of the brain lining (meningitis), nerve palsies, an abnormal heartbeat, or severe muscle and joint pain and inflammation (myositis or arthritis). DIAGNOSIS   Physical exam and medical history.   Viewing the tick if it was saved for confirmation.   Blood tests (to check or confirm the presence of Lyme disease).  TREATMENT  Most ticks do not carry disease. If found, an attached tick should be removed using tweezers. Tweezers should be placed under the body of the tick so it is removed by its attachment parts (pincers). If there are signs or symptoms of being sick, or Lyme disease is confirmed, medicines (antibiotics) that kill germs are usually prescribed. In more severe cases, antibiotics may be given through an intravenous (IV) access. HOME CARE INSTRUCTIONS   Always remove ticks with tweezers. Do not use petroleum jelly or other methods to kill or remove the tick. Slide the tweezers under the body and pull out as much as you can. If you are not sure what it is, save it in a jar and show your caregiver.   Once you remove the tick, the skin will heal on its own. Wash your hands and the affected area with water and soap. You may place a bandage on the affected area.   Take medicine as directed. You may be advised to take a full course of antibiotics.   Follow up  be advised to take a full course of antibiotics.   Follow up with your caregiver as recommended.  FINDING OUT THE RESULTS OF YOUR TEST  Not all test results are available during your visit. If your test results are not back during the visit, make an appointment with your caregiver to find out the results. Do not assume everything is normal if you have not heard from your caregiver or the medical facility. It is important for you to follow up on all of your test results.  PROGNOSIS   If Lyme disease is confirmed, early treatment with antibiotics is very effective. Following preventive guidelines is important since it is possible to get the disease more than once.  PREVENTION    Wear long sleeves and long pants in  wooded or grassy areas. Tuck your pants into your socks.   Use an insect repellent while hiking.   Check yourself, your children, and your pets regularly for ticks after playing outside.   Clear piles of leaves or brush from your yard. Ticks might live there.  SEEK MEDICAL CARE IF:    You or your child has an oral temperature above 102 F (38.9 C).   You develop a severe headache following the bite.   You feel generally ill.   You notice a rash.   You are having trouble removing the tick.   The bite area has red skin or yellow drainage.  SEEK IMMEDIATE MEDICAL CARE IF:    Your face is weak and droopy or you have other neurological symptoms.   You have severe joint pain or weakness.  MAKE SURE YOU:    Understand these instructions.   Will watch your condition.   Will get help right away if you are not doing well or get worse.  FOR MORE INFORMATION  Centers for Disease Control and Prevention: www.cdc.gov  American Academy of Family Physicians: www.aafp.org  Document Released: 06/05/2009 Document Revised: 02/28/2011 Document Reviewed: 06/05/2009  [employer] Patient Information 2012 [employer].

## 2011-07-19 NOTE — Progress Notes (Signed)
Subjective:    [name], male    DOB: [birth year], 78 y.o.   MRN: [id number]  HPI  Patient seen with nonspecific symptoms of malaise and myalgias. He had multiple tick bites over the past month,  presumably dear ticks. We had pulled one of these off a few weeks ago. He had questionable low-grade fever past 2 days but has had some nasal congestion and possible cold-like symptoms. Had intermittent mild headaches. No petechial rash and no rash consistent with erythema chronicum migrans. He had some localized rash around the site of tick bite probably due to the allergic response. He is also having some crusted drainage bilaterally eyes. Occasional dry cough. Thick yellow nasal discharge. Patient is concerned about possible tick-related illness.  Past Medical History  Diagnosis Date  . HYPERLIPIDEMIA 04/11/2009    Qualifier: Diagnosis of  By: Rita Ohara    . CERUMEN IMPACTION 04/11/2009    Qualifier: Diagnosis of  By: Rita Ohara    . INTERNAL HEMORRHOIDS 08/28/2006    Qualifier: Diagnosis of  By: Thereasa Solo    . APHTHOUS ULCERS 12/19/2009    Qualifier: Diagnosis of  By: Caryl Never MD, Verle Brillhart    . ANGULAR CHEILITIS 04/11/2009    Qualifier: Diagnosis of  By: Rita Ohara    . GERD 12/02/2008    Qualifier: Diagnosis of  By: Caryl Never MD, Makana Feigel    . DIVERTICULOSIS, COLON 08/28/2006    Qualifier: Diagnosis of  By: Thereasa Solo    . OSTEOPOROSIS 08/21/2007    Qualifier: Diagnosis of  By: Thereasa Solo    . SLEEP APNEA 08/21/2007    Qualifier: Diagnosis of  By: Thereasa Solo    . COLONIC POLYPS, HX OF 12/02/2008    Qualifier: Diagnosis of  By: Caryl Never MD, Nene Aranas    . COLITIS, HX OF 08/21/2007    Qualifier: Diagnosis of  By: Thereasa Solo    . BENIGN PROSTATIC HYPERTROPHY, HX OF 08/21/2007    Qualifier: Diagnosis of  By: Thereasa Solo     Past Surgical History  Procedure Date  . Hemorrhoid surgery 2001    reports that he has  never smoked. He does not have any smokeless tobacco history on file. His alcohol and drug histories not on file. family history includes Heart disease in his other. No Known Allergies   Review of Systems  Constitutional: Positive for fever and fatigue. Negative for chills and appetite change.  HENT: Positive for congestion. Negative for sore throat.   Respiratory: Positive for cough.   Gastrointestinal: Negative for abdominal pain.  Genitourinary: Negative for dysuria.  Skin: Negative for rash.  Neurological: Positive for headaches.       Objective:   Physical Exam  Constitutional: He appears well-developed and well-nourished.  HENT:  Right Ear: External ear normal.  Mouth/Throat: Oropharynx is clear and moist.        thick yellow mucus posterior pharynx otherwise clear  Eyes: Pupils are equal, round, and reactive to light.       Mild conjunctiva erythema bilaterally. He has slight yellow to green crusted drainage both lids  Neck: Neck supple.  Cardiovascular: Normal rate and regular rhythm.   Pulmonary/Chest: Effort normal and breath sounds normal. No respiratory distress. He has no wheezes. He has no rales.  Lymphadenopathy:    He has no cervical adenopathy.  Skin:       Couple small erythematous papules trunk from recent tick bites but no pustules.  Assessment & Plan:  #1 nonspecific symptoms of malaise and myalgias probably related to viral illness. #2 multiple recent tick bites from deer tick. Although no specific indicators such as erythema chronicum migrans he is aware of deficiencies of testing and also the fact that not all patients get this type of rash. Since not clear that his symptoms are related to viral illness we'll go ahead and cover with doxycycline 100 milligrams twice daily for 14 days #3 bilateral bacterial conjunctivitis. Polytrim ophthalmic drops 2 drops each four times daily

## 2011-07-22 ENCOUNTER — Encounter: Payer: Self-pay | Admitting: Family Medicine

## 2011-07-22 DIAGNOSIS — D235 Other benign neoplasm of skin of trunk: Secondary | ICD-10-CM | POA: Diagnosis not present

## 2011-07-22 DIAGNOSIS — Z85828 Personal history of other malignant neoplasm of skin: Secondary | ICD-10-CM | POA: Diagnosis not present

## 2011-07-22 DIAGNOSIS — L570 Actinic keratosis: Secondary | ICD-10-CM | POA: Diagnosis not present

## 2011-09-19 ENCOUNTER — Emergency Department (INDEPENDENT_AMBULATORY_CARE_PROVIDER_SITE_OTHER)
Admission: EM | Admit: 2011-09-19 | Discharge: 2011-09-19 | Disposition: A | Discharge status: Home / Self Care | Payer: Medicare Other | Source: Home / Self Care | Attending: Emergency Medicine | Admitting: Emergency Medicine

## 2011-09-19 ENCOUNTER — Encounter (HOSPITAL_COMMUNITY): Payer: Self-pay | Admitting: Emergency Medicine

## 2011-09-19 DIAGNOSIS — T148XXA Other injury of unspecified body region, initial encounter: Secondary | ICD-10-CM

## 2011-09-19 DIAGNOSIS — IMO0002 Reserved for concepts with insufficient information to code with codable children: Secondary | ICD-10-CM

## 2011-09-19 NOTE — Discharge Instructions (Signed)

## 2011-09-19 NOTE — ED Notes (Signed)
Dr Lorenz Coaster applied a dressing/pressure dressing after suturing wound

## 2011-09-19 NOTE — ED Notes (Signed)
Unsure of last tetanus

## 2011-09-19 NOTE — ED Provider Notes (Signed)
Chief Complaint  Patient presents with  . Laceration    History of Present Illness:  James Hardy is an 78 year old male who around 5:15 PM today lacerated his left lower leg with a chain saw. This was a superficial laceration and it bled freely. Bleeding is now controlled. His last tetanus vaccine was 4-5 years ago. He denies any numbness, tingling, or weakness. He is able to move his toes well and denies any pain lower down his leg.  Review of Systems:  Other than noted above, the patient denies any of the following symptoms: Systemic:  No fever or chills. Musculoskeletal:  No joint pain or decreased range of motion. Neuro:  No numbness, tingling, or weakness.  PMFSH:  Past medical history, family history, social history, meds, and allergies were reviewed.  Physical Exam:   Vital signs:  BP 150/51  Pulse 62  Temp 98.5 F (36.9 C) (Oral)  Resp 16  SpO2 100% Ext:  There was a 3.5 cm laceration to the left lower leg across the pretibial surface. This was fairly shallow. The edges were little bit ragged. There were no obvious foreign bodies.  All joints had a full ROM without pain.  Pulses were full.  Good capillary refill in all digits.  No edema. Neurological:  Alert and oriented.  No muscle weakness.  Sensation was intact to light touch.     Procedure: Verbal informed consent was obtained.  The patient was informed of the risks and benefits of the procedure and understands and accepts.  Identity of the patient was verified verbally and by wristband.   The laceration area described above was prepped with Betadine and irrigated copiously with normal saline  and anesthetized with 10 mL of 2% Xylocaine with epinephrine.  The wound was then closed as follows:  The edges were approximated with 6 4-0 nylon sutures.  There were no immediate complications, and the patient tolerated the procedure well. The laceration was then cleansed, Bacitracin ointment was applied and a clean, dry pressure  dressing was put on.   Assessment:  The encounter diagnosis was Laceration.  Plan:   1.  The following meds were prescribed:   New Prescriptions   No medications on file   2.  The patient was instructed in wound care and pain control, and handouts were given. 3.  The patient was told to return in 10 days for suture removal or wound recheck or sooner if any sign of infection.     Reuben Likes, MD 09/19/11 [id number]

## 2011-09-19 NOTE — ED Notes (Signed)
Injury to left lower leg with chainsaw.  Bleeding controlled.  One laceration, irregular borders to wound

## 2011-09-26 ENCOUNTER — Emergency Department (INDEPENDENT_AMBULATORY_CARE_PROVIDER_SITE_OTHER)
Admission: EM | Admit: 2011-09-26 | Discharge: 2011-09-26 | Disposition: A | Discharge status: Home / Self Care | Payer: Medicare Other | Source: Home / Self Care | Attending: Emergency Medicine | Admitting: Emergency Medicine

## 2011-09-26 ENCOUNTER — Encounter (HOSPITAL_COMMUNITY): Payer: Self-pay | Admitting: *Deleted

## 2011-09-26 DIAGNOSIS — T148XXA Other injury of unspecified body region, initial encounter: Secondary | ICD-10-CM

## 2011-09-26 DIAGNOSIS — IMO0002 Reserved for concepts with insufficient information to code with codable children: Secondary | ICD-10-CM

## 2011-09-26 MED ORDER — CEPHALEXIN 500 MG PO CAPS: 500.0000 mg | ORAL_CAPSULE | Freq: Three times a day (TID) | ORAL | Status: AC

## 2011-09-26 MED ORDER — MUPIROCIN 2 % EX OINT: TOPICAL_OINTMENT | Freq: Three times a day (TID) | CUTANEOUS | Status: AC

## 2011-09-26 NOTE — ED Provider Notes (Signed)
Chief Complaint  Patient presents with  . Wound Check    History of Present Illness:  The patient is an 78 year old male who returns for a recheck on a laceration to his left leg. He was here week ago. He did this with a chainsaw. It was sutured up with 6 sutures. Has been healing well up until yesterday when it was a little bit more sore and looked red. He denies any pus drainage or fever.  Review of Systems:  Other than noted above, the patient denies any of the following symptoms: Systemic:  No fever or chills. Musculoskeletal:  No joint pain or decreased range of motion. Neuro:  No numbness, tingling, or weakness.  PMFSH:  Past medical history, family history, social history, meds, and allergies were reviewed.  Physical Exam:   Vital signs:  BP 177/76  Pulse 56  Temp 98 F (36.7 C) (Oral)  Resp 16  SpO2 100% Ext:  There is a little erythema of the wound edges. No purulent drainage. There is mild tenderness to palpation.  All joints had a full ROM without pain.  Pulses were full.  Good capillary refill in all digits.  No edema. Neurological:  Alert and oriented.  No muscle weakness.  Sensation was intact to light touch.     Assessment:  The encounter diagnosis was Laceration. I think he has a little bit of mild localized infection. We'll go ahead and start off with cephalexin and mupirocin. I suggested that he come back in another week for suture removal.  Plan:   1.  The following meds were prescribed:   New Prescriptions   CEPHALEXIN (KEFLEX) 500 MG CAPSULE    Take 1 capsule (500 mg total) by mouth 3 (three) times daily.   MUPIROCIN OINTMENT (BACTROBAN) 2 %    Apply topically 3 (three) times daily.   2.  The patient was instructed in wound care and pain control, and handouts were given. 3.  The patient was told to return in 7 days for suture removal or wound recheck or sooner if any sign of infection.     Reuben Likes, MD 09/26/11 [phone]

## 2011-09-26 NOTE — ED Notes (Signed)
Pt with concern of left foot stiches look sort of red, pt think it might be infected. He came here last week and was told to come only if wound look infected. Pt states that he feels a little discomfort. He been cleaning it with oitnment like he was told. Denies fever or temperature.

## 2011-09-26 NOTE — ED Notes (Signed)
Pt  States  Last  Tetanus  Shot  Within  Last  5  Years

## 2011-09-26 NOTE — ED Notes (Signed)
Pt here  For  Wound  Check  Of  Laceration    Of  His  Left  Lower  Leg   -  Sutures  Are  In  Place       Slight  Redness  Is  Noted  He   Reports     That    It  Is  Slightly  Tender  To  The  Touch

## 2011-09-26 NOTE — Discharge Instructions (Signed)

## 2011-10-01 ENCOUNTER — Ambulatory Visit: Payer: BC Managed Care – PPO | Admitting: Family

## 2011-10-03 ENCOUNTER — Emergency Department (INDEPENDENT_AMBULATORY_CARE_PROVIDER_SITE_OTHER)
Admission: EM | Admit: 2011-10-03 | Discharge: 2011-10-03 | Disposition: A | Discharge status: Home / Self Care | Payer: Medicare Other | Source: Home / Self Care | Attending: Emergency Medicine | Admitting: Emergency Medicine

## 2011-10-03 ENCOUNTER — Encounter (HOSPITAL_COMMUNITY): Payer: Self-pay | Admitting: *Deleted

## 2011-10-03 DIAGNOSIS — Z4802 Encounter for removal of sutures: Secondary | ICD-10-CM

## 2011-10-03 DIAGNOSIS — IMO0002 Reserved for concepts with insufficient information to code with codable children: Secondary | ICD-10-CM

## 2011-10-03 NOTE — ED Notes (Signed)
Pt returns for suture removal of 09/18/2011 laceration left anterior lower leg.    Wound somewhat reddened and some edges not well approximated, but sutures intact

## 2011-10-03 NOTE — ED Provider Notes (Signed)
History     CSN: [id number]  Arrival date & time 10/03/11  1101   First MD Initiated Contact with Patient 10/03/11 1108      Chief Complaint  Patient presents with  . Suture / Staple Removal    (Consider location/radiation/quality/duration/timing/severity/associated sxs/prior treatment) HPI Comments: Sutures placed 6/26. Presented here on 76 for recheck was thought to have mild localized infection, sent home with Keflex and Bactroban. Pt currently has no c/o- states is getting better. States is taking keflex & using bactroban as written. No fevers, N/V, increased erythema, pain, purulent drainage.  Patient is a 78 y.o. male presenting with wound check. The history is provided by the patient. No language interpreter was used.  Wound Check  Previous treatment in the ED includes laceration repair and oral antibiotics. Treatments since wound repair include antibiotic ointment use and oral antibiotics. There has been no drainage from the wound. The redness has improved. The swelling has improved. The pain has improved. He has no difficulty moving the affected extremity or digit.    Past Medical History  Diagnosis Date  . HYPERLIPIDEMIA 04/11/2009    Qualifier: Diagnosis of  By: Rita Ohara    . CERUMEN IMPACTION 04/11/2009    Qualifier: Diagnosis of  By: Rita Ohara    . INTERNAL HEMORRHOIDS 08/28/2006    Qualifier: Diagnosis of  By: Thereasa Solo    . APHTHOUS ULCERS 12/19/2009    Qualifier: Diagnosis of  By: Caryl Never MD, Bruce    . ANGULAR CHEILITIS 04/11/2009    Qualifier: Diagnosis of  By: Rita Ohara    . GERD 12/02/2008    Qualifier: Diagnosis of  By: Caryl Never MD, Bruce    . DIVERTICULOSIS, COLON 08/28/2006    Qualifier: Diagnosis of  By: Thereasa Solo    . OSTEOPOROSIS 08/21/2007    Qualifier: Diagnosis of  By: Thereasa Solo    . SLEEP APNEA 08/21/2007    Qualifier: Diagnosis of  By: Thereasa Solo    . COLONIC POLYPS, HX OF 12/02/2008   Qualifier: Diagnosis of  By: Caryl Never MD, Bruce    . COLITIS, HX OF 08/21/2007    Qualifier: Diagnosis of  By: Thereasa Solo    . BENIGN PROSTATIC HYPERTROPHY, HX OF 08/21/2007    Qualifier: Diagnosis of  By: Thereasa Solo      Past Surgical History  Procedure Date  . Hemorrhoid surgery 2001    Family History  Problem Relation Age of Onset  . Heart disease Other   . Asthma Mother     History  Substance Use Topics  . Smoking status: Never Smoker   . Smokeless tobacco: Not on file  . Alcohol Use: No      Review of Systems  Allergies  Neosporin  Home Medications   Current Outpatient Rx  Name Route Sig Dispense Refill  . CEPHALEXIN 500 MG PO CAPS Oral Take 1 capsule (500 mg total) by mouth 3 (three) times daily. 30 capsule 0  . CLOTRIMAZOLE-BETAMETHASONE 1-0.05 % EX CREA Topical Apply topically 2 (two) times daily as needed.    Marland Kitchen DOXAZOSIN MESYLATE 8 MG PO TABS Oral Take 8 mg by mouth at bedtime.    . DUTASTERIDE 0.5 MG PO CAPS Oral Take 0.5 mg by mouth daily.    Marland Kitchen OMEPRAZOLE 20 MG PO CPDR  TAKE 1 CAPSULE ONCE DAILY 90 capsule 3  . PRO-BIOTIC BLEND PO CAPS Oral Take by mouth daily.    . ALENDRONATE SODIUM 70 MG PO TABS Oral Take 70  mg by mouth every 7 (seven) days. Take with a full glass of water on an empty stomach.    . MUPIROCIN 2 % EX OINT Topical Apply topically 3 (three) times daily. 22 g 0    BP 162/71  Pulse 61  Temp 98.5 F (36.9 C) (Oral)  Resp 18  SpO2 99%  Physical Exam  Nursing note and vitals reviewed. Constitutional: He is oriented to person, place, and time. He appears well-developed and well-nourished.  HENT:  Head: Normocephalic and atraumatic.  Eyes: Conjunctivae and EOM are normal.  Neck: Normal range of motion.  Cardiovascular: Normal rate.   Pulmonary/Chest: Effort normal. No respiratory distress.  Musculoskeletal: Normal range of motion.       Healing wound 6 sutures present. Mild surrounding erythema, minimal  tenderness. No expressible purulent drainage.  Neurological: He is alert and oriented to person, place, and time. Coordination normal.  Skin: Skin is warm and dry.  Psychiatric: He has a normal mood and affect. His behavior is normal. Judgment and thought content normal.    ED Course  SUTURE REMOVAL Date/Time: 10/03/2011 11:57 AM Performed by: Luiz Blare Authorized by: Luiz Blare Consent: Verbal consent obtained. Risks and benefits: risks, benefits and alternatives were discussed Consent given by: patient Patient understanding: patient states understanding of the procedure being performed Patient consent: the patient's understanding of the procedure matches consent given Required items: required blood products, implants, devices, and special equipment available Patient identity confirmed: verbally with patient Time out: Immediately prior to procedure a "time out" was called to verify the correct patient, procedure, equipment, support staff and site/side marked as required. Body area: lower extremity Location details: left lower leg Wound Appearance: red Sutures Removed: 6 Post-removal: Steri-Strips applied Patient tolerance: Patient tolerated the procedure well with no immediate complications. Comments: Placing several Steri-Strips at distal end of the wound, to facilitate healing, proximal part is well healed.   (including critical care time)  Labs Reviewed - No data to display No results found.   1. Laceration   2. Visit for suture removal       MDM  Previous records reviewed. Appears to be healing compared to previous picture, but think that the wound would benefit from some extra support distally. D/w pt sx/sn that should prompt return  ,   Luiz Blare, MD 10/03/11 1159

## 2011-12-02 DIAGNOSIS — L570 Actinic keratosis: Secondary | ICD-10-CM | POA: Diagnosis not present

## 2011-12-02 DIAGNOSIS — L738 Other specified follicular disorders: Secondary | ICD-10-CM | POA: Diagnosis not present

## 2011-12-09 ENCOUNTER — Telehealth: Payer: Self-pay | Admitting: Family Medicine

## 2011-12-09 MED ORDER — OMEPRAZOLE 20 MG PO CPDR: 20.0000 mg | DELAYED_RELEASE_CAPSULE | Freq: Every day | ORAL | Status: DC

## 2011-12-09 NOTE — Telephone Encounter (Signed)
Pt called req to get a refill of omeprazole (PRILOSEC) 20 MG capsule called in to [name] [place], [place]. Pt no longer uses [name].

## 2012-01-13 ENCOUNTER — Ambulatory Visit: Payer: Medicare Other | Admitting: Family Medicine

## 2012-03-24 DIAGNOSIS — H02409 Unspecified ptosis of unspecified eyelid: Secondary | ICD-10-CM | POA: Diagnosis not present

## 2012-03-24 DIAGNOSIS — H2510 Age-related nuclear cataract, unspecified eye: Secondary | ICD-10-CM | POA: Diagnosis not present

## 2012-03-24 DIAGNOSIS — H04129 Dry eye syndrome of unspecified lacrimal gland: Secondary | ICD-10-CM | POA: Diagnosis not present

## 2012-04-16 ENCOUNTER — Ambulatory Visit (INDEPENDENT_AMBULATORY_CARE_PROVIDER_SITE_OTHER): Payer: Medicare Other | Admitting: Otolaryngology

## 2012-04-16 DIAGNOSIS — H6120 Impacted cerumen, unspecified ear: Secondary | ICD-10-CM

## 2012-04-16 DIAGNOSIS — K1230 Oral mucositis (ulcerative), unspecified: Secondary | ICD-10-CM | POA: Diagnosis not present

## 2012-04-16 DIAGNOSIS — K121 Other forms of stomatitis: Secondary | ICD-10-CM

## 2012-04-22 ENCOUNTER — Encounter: Payer: Self-pay | Admitting: Family Medicine

## 2012-04-22 ENCOUNTER — Ambulatory Visit (INDEPENDENT_AMBULATORY_CARE_PROVIDER_SITE_OTHER): Payer: Medicare Other | Admitting: Family Medicine

## 2012-04-22 VITALS — BP 140/64 | Temp 98.3°F | Wt 141.0 lb

## 2012-04-22 DIAGNOSIS — R490 Dysphonia: Secondary | ICD-10-CM

## 2012-04-22 DIAGNOSIS — R42 Dizziness and giddiness: Secondary | ICD-10-CM | POA: Diagnosis not present

## 2012-04-22 DIAGNOSIS — K219 Gastro-esophageal reflux disease without esophagitis: Secondary | ICD-10-CM | POA: Diagnosis not present

## 2012-04-22 NOTE — Progress Notes (Signed)
Subjective:    [name], male    DOB: 03/21/1929, 79 y.o.   MRN: [id number]  HPI  Acute visit. Patient seen with one week history of hoarseness. He has a long history of chronic GERD. He drinks very little coffee but drinks about 2 glasses of tea per day. Also eating a lot of oranges recently. Sometimes has reflux symptoms at night. He takes Prilosec 20 mg regularly. No dysphagia. No appetite or weight changes. No history of smoking. Also has history of some postnasal drainage off and on past week. Wife notes that he is clearing his throat frequently. Does not elevate head of bed. Denies recent cough. No fever.  Recently had some intermittent vertigo past few weeks. Symptoms are very intermittent. Usually worse first thing in the morning and improves as the day goes on. No hearing changes. No speech changes. No swallowing difficulties. No focal weakness. No headaches. Symptoms are usually very brief and transient.  Past Medical History  Diagnosis Date  . HYPERLIPIDEMIA 04/11/2009    Qualifier: Diagnosis of  By: Rita Ohara    . CERUMEN IMPACTION 04/11/2009    Qualifier: Diagnosis of  By: Rita Ohara    . INTERNAL HEMORRHOIDS 08/28/2006    Qualifier: Diagnosis of  By: Thereasa Solo    . APHTHOUS ULCERS 12/19/2009    Qualifier: Diagnosis of  By: Caryl Never MD, Blakely Gluth    . ANGULAR CHEILITIS 04/11/2009    Qualifier: Diagnosis of  By: Rita Ohara    . GERD 12/02/2008    Qualifier: Diagnosis of  By: Caryl Never MD, Any Mcneice    . DIVERTICULOSIS, COLON 08/28/2006    Qualifier: Diagnosis of  By: Thereasa Solo    . OSTEOPOROSIS 08/21/2007    Qualifier: Diagnosis of  By: Thereasa Solo    . SLEEP APNEA 08/21/2007    Qualifier: Diagnosis of  By: Thereasa Solo    . COLONIC POLYPS, HX OF 12/02/2008    Qualifier: Diagnosis of  By: Caryl Never MD, Sigismund Cross    . COLITIS, HX OF 08/21/2007    Qualifier: Diagnosis of  By: Thereasa Solo    . BENIGN PROSTATIC  HYPERTROPHY, HX OF 08/21/2007    Qualifier: Diagnosis of  By: Thereasa Solo     Past Surgical History  Procedure Date  . Hemorrhoid surgery 2001    reports that he has never smoked. He does not have any smokeless tobacco history on file. He reports that he does not drink alcohol or use illicit drugs. family history includes Asthma in his mother and Heart disease in his other. Allergies  Allergen Reactions  . Neosporin (Neomycin-Bacitracin Zn-Polymyx) Rash      Review of Systems  Constitutional: Negative for fever, chills, appetite change and unexpected weight change.  HENT: Positive for voice change and postnasal drip. Negative for sore throat, trouble swallowing and sinus pressure.   Respiratory: Negative for cough.   Cardiovascular: Negative for chest pain.  Neurological: Negative for dizziness and headaches.  Hematological: Negative for adenopathy.       Objective:   Physical Exam  Constitutional: He is oriented to person, place, and time. He appears well-developed and well-nourished. No distress.  HENT:  Right Ear: External ear normal.  Left Ear: External ear normal.  Mouth/Throat: Oropharynx is clear and moist.  Neck: Neck supple. No thyromegaly present.  Cardiovascular: Normal rate and regular rhythm.   Pulmonary/Chest: Effort normal and breath sounds normal. No respiratory distress. He has no wheezes. He has no rales.  Lymphadenopathy:  He has no cervical adenopathy.  Neurological: He is alert and oriented to person, place, and time. No cranial nerve deficit.       No focal weakness. Gait normal. Finger to nose testing normal          Assessment & Plan:  #1 hoarseness. Only one week duration. Suspect related to postnasal drip and or GERD symptoms. Elevate head of bed 6-8 inches. Supplement Prilosec with Zantac or Pepcid. Reduce caffeine and citric acid consumption.  Touch base in 2 weeks if symptoms not improving #2 intermittent vertigo. Suspect benign  positional vertigo. Nonfocal neuro exam. Consider vestibular rehabilitation if symptoms persist. Followup immediately for any change in symptoms

## 2012-04-22 NOTE — Patient Instructions (Addendum)
Gastroesophageal Reflux Disease, Adult Gastroesophageal reflux disease (GERD) happens when acid from your stomach flows up into the esophagus. When acid comes in contact with the esophagus, the acid causes soreness (inflammation) in the esophagus. Over time, GERD may create small holes (ulcers) in the lining of the esophagus. CAUSES   Increased body weight. This puts pressure on the stomach, making acid rise from the stomach into the esophagus.  Smoking. This increases acid production in the stomach.  Drinking alcohol. This causes decreased pressure in the lower esophageal sphincter (valve or ring of muscle between the esophagus and stomach), allowing acid from the stomach into the esophagus.  Late evening meals and a full stomach. This increases pressure and acid production in the stomach.  A malformed lower esophageal sphincter. Sometimes, no cause is found. SYMPTOMS   Burning pain in the lower part of the mid-chest behind the breastbone and in the mid-stomach area. This may occur twice a week or more often.  Trouble swallowing.  Sore throat.  Dry cough.  Asthma-like symptoms including chest tightness, shortness of breath, or wheezing. DIAGNOSIS  Your caregiver may be able to diagnose GERD based on your symptoms. In some cases, X-rays and other tests may be done to check for complications or to check the condition of your stomach and esophagus. TREATMENT  Your caregiver may recommend over-the-counter or prescription medicines to help decrease acid production. Ask your caregiver before starting or adding any new medicines.  HOME CARE INSTRUCTIONS   Change the factors that you can control. Ask your caregiver for guidance concerning weight loss, quitting smoking, and alcohol consumption.  Avoid foods and drinks that make your symptoms worse, such as:  Caffeine or alcoholic drinks.  Chocolate.  Peppermint or mint flavorings.  Garlic and onions.  Spicy foods.  Citrus fruits,  such as oranges, lemons, or limes.  Tomato-based foods such as sauce, chili, salsa, and pizza.  Fried and fatty foods.  Avoid lying down for the 3 hours prior to your bedtime or prior to taking a nap.  Eat small, frequent meals instead of large meals.  Wear loose-fitting clothing. Do not wear anything tight around your waist that causes pressure on your stomach.  Raise the head of your bed 6 to 8 inches with wood blocks to help you sleep. Extra pillows will not help.  Only take over-the-counter or prescription medicines for pain, discomfort, or fever as directed by your caregiver.  Do not take aspirin, ibuprofen, or other nonsteroidal anti-inflammatory drugs (NSAIDs). SEEK IMMEDIATE MEDICAL CARE IF:   You have pain in your arms, neck, jaw, teeth, or back.  Your pain increases or changes in intensity or duration.  You develop nausea, vomiting, or sweating (diaphoresis).  You develop shortness of breath, or you faint.  Your vomit is green, yellow, black, or looks like coffee grounds or blood.  Your stool is red, bloody, or black. These symptoms could be signs of other problems, such as heart disease, gastric bleeding, or esophageal bleeding. MAKE SURE YOU:   Understand these instructions.  Will watch your condition.  Will get help right away if you are not doing well or get worse. Document Released: 12/19/2004 Document Revised: 06/03/2011 Document Reviewed: 09/28/2010 [facility] Patient Information 2013 [place], [place].  Try to reduce caffeine use (tea) Elevate head of bed 6-8 inches Consider supplement Prilosec with Zantac or Pepcid

## 2012-07-28 DIAGNOSIS — M722 Plantar fascial fibromatosis: Secondary | ICD-10-CM | POA: Diagnosis not present

## 2012-08-20 DIAGNOSIS — L570 Actinic keratosis: Secondary | ICD-10-CM | POA: Diagnosis not present

## 2012-08-20 DIAGNOSIS — D235 Other benign neoplasm of skin of trunk: Secondary | ICD-10-CM | POA: Diagnosis not present

## 2012-08-20 DIAGNOSIS — T1490XA Injury, unspecified, initial encounter: Secondary | ICD-10-CM | POA: Diagnosis not present

## 2012-12-10 DIAGNOSIS — L820 Inflamed seborrheic keratosis: Secondary | ICD-10-CM | POA: Diagnosis not present

## 2012-12-10 DIAGNOSIS — L570 Actinic keratosis: Secondary | ICD-10-CM | POA: Diagnosis not present

## 2012-12-10 DIAGNOSIS — D235 Other benign neoplasm of skin of trunk: Secondary | ICD-10-CM | POA: Diagnosis not present

## 2012-12-11 DIAGNOSIS — S61409A Unspecified open wound of unspecified hand, initial encounter: Secondary | ICD-10-CM | POA: Diagnosis not present

## 2012-12-20 ENCOUNTER — Other Ambulatory Visit: Payer: Self-pay | Admitting: Family Medicine

## 2013-01-29 ENCOUNTER — Encounter: Payer: Self-pay | Admitting: Family Medicine

## 2013-01-29 ENCOUNTER — Ambulatory Visit (INDEPENDENT_AMBULATORY_CARE_PROVIDER_SITE_OTHER): Payer: Medicare Other | Admitting: Family Medicine

## 2013-01-29 VITALS — BP 130/68 | HR 67 | Temp 97.9°F | Wt 143.0 lb

## 2013-01-29 DIAGNOSIS — R079 Chest pain, unspecified: Secondary | ICD-10-CM

## 2013-01-29 DIAGNOSIS — Z23 Encounter for immunization: Secondary | ICD-10-CM

## 2013-01-29 NOTE — Patient Instructions (Signed)
No heavy exertion until further evaluation. Start baby aspirin one daily.

## 2013-01-29 NOTE — Progress Notes (Addendum)
Subjective:    [name], male    DOB: 12/10/1928, 79 y.o.   MRN: [id number]  HPI Increasing shortness of breath recently with exertion. Duration couple of months.  Patient also relates occasional episodes of chest discomfort substernally with exertion only. He has some difficulty describing quality of symptoms. He does not have any radiation to neck or left upper extremity. No nausea or vomiting. Symptoms promptly improved with rest. He has never had any cardiac history. He has occasional dizziness early morning with first standing but no consistent orthostatic symptoms. Currently pain free. Symptoms been stable over the past couple of months.  Patient had brother with reported CAD symptoms but no family history of premature CAD. Patient nonsmoker. No history of diabetes. History of mild hyperlipidemia. Generally stays very active for his age.  Past Medical History  Diagnosis Date  . HYPERLIPIDEMIA 04/11/2009    Qualifier: Diagnosis of  By: Rita Ohara    . CERUMEN IMPACTION 04/11/2009    Qualifier: Diagnosis of  By: Rita Ohara    . INTERNAL HEMORRHOIDS 08/28/2006    Qualifier: Diagnosis of  By: Thereasa Solo    . APHTHOUS ULCERS 12/19/2009    Qualifier: Diagnosis of  By: Caryl Never MD, Clair Bardwell    . ANGULAR CHEILITIS 04/11/2009    Qualifier: Diagnosis of  By: Rita Ohara    . GERD 12/02/2008    Qualifier: Diagnosis of  By: Caryl Never MD, Julie Nay    . DIVERTICULOSIS, COLON 08/28/2006    Qualifier: Diagnosis of  By: Thereasa Solo    . OSTEOPOROSIS 08/21/2007    Qualifier: Diagnosis of  By: Thereasa Solo    . SLEEP APNEA 08/21/2007    Qualifier: Diagnosis of  By: Thereasa Solo    . COLONIC POLYPS, HX OF 12/02/2008    Qualifier: Diagnosis of  By: Caryl Never MD, Korban Shearer    . COLITIS, HX OF 08/21/2007    Qualifier: Diagnosis of  By: Thereasa Solo    . BENIGN PROSTATIC HYPERTROPHY, HX OF 08/21/2007    Qualifier: Diagnosis of  By:  Thereasa Solo     Past Surgical History  Procedure Laterality Date  . Hemorrhoid surgery  2001    reports that he has never smoked. He does not have any smokeless tobacco history on file. He reports that he does not drink alcohol or use illicit drugs. family history includes Asthma in his mother; Heart disease in his other. Allergies  Allergen Reactions  . Neosporin [Neomycin-Bacitracin Zn-Polymyx] Rash      Review of Systems  Constitutional: Negative for appetite change and unexpected weight change.  Respiratory: Positive for shortness of breath. Negative for cough and wheezing.   Cardiovascular: Positive for chest pain. Negative for palpitations and leg swelling.  Gastrointestinal: Negative for nausea, vomiting and abdominal pain.  Neurological: Positive for dizziness. Negative for syncope.       Objective:   Physical Exam  Constitutional: He appears well-developed and well-nourished.  Neck: Neck supple. No thyromegaly present.  Cardiovascular: Normal rate and regular rhythm.   Murmur heard. Patient has 2-3/6 systolic murmur heard best right upper sternal border.  Pulmonary/Chest: Effort normal and breath sounds normal. No respiratory distress. He has no wheezes. He has no rales.  Musculoskeletal: He exhibits no edema.  Neurological: He is alert.          Assessment & Plan:  Exertional dyspnea and chest pain. Symptoms are worrisome for exertional angina. He also has fairly prominent aortic murmur. Rule out aortic stenosis. Obtain  EKG. We'll set up nuclear stress test and echocardiogram. Low threshold for cardiology referral. Start baby aspirin. He has no contraindications. Blood pressure is currently well-controlled.  We discussed possible low dose beta blocker but his heart rate is currently low 60s.  EKG normal sinus rhythm with no acute changes.  Stress nuclear study normal.  Echo- normal LV function with moderate aortic stenosis.  Discussed with patient.   Will set up cardiology appointment.

## 2013-02-01 ENCOUNTER — Telehealth: Payer: Self-pay | Admitting: Family Medicine

## 2013-02-01 NOTE — Telephone Encounter (Signed)
recv call from [place] [facility]ology stating pt is sch for [facility] 02/15/13 @ 10:30 and Stress Test on 02/17/13 at 09:15.  The request for pt to be notified, pre-cert processed, and pt need instructions given.  If not, pt needs to be r/s

## 2013-02-09 ENCOUNTER — Telehealth: Payer: Self-pay | Admitting: Family Medicine

## 2013-02-09 DIAGNOSIS — G4730 Sleep apnea, unspecified: Secondary | ICD-10-CM

## 2013-02-09 NOTE — Telephone Encounter (Signed)
Pt needs new rxs for cpap mask and also for [facility] to check cpap machine. Please fax order to [phone]

## 2013-02-09 NOTE — Telephone Encounter (Signed)
OK to order 

## 2013-02-09 NOTE — Telephone Encounter (Signed)
Is it okay? 

## 2013-02-10 NOTE — Telephone Encounter (Signed)
Order is placed.

## 2013-02-15 ENCOUNTER — Ambulatory Visit (HOSPITAL_COMMUNITY): Payer: Medicare Other | Attending: Family Medicine | Admitting: Cardiology

## 2013-02-15 ENCOUNTER — Encounter: Payer: Self-pay | Admitting: Cardiovascular Disease

## 2013-02-15 DIAGNOSIS — E785 Hyperlipidemia, unspecified: Secondary | ICD-10-CM | POA: Diagnosis not present

## 2013-02-15 DIAGNOSIS — I079 Rheumatic tricuspid valve disease, unspecified: Secondary | ICD-10-CM | POA: Diagnosis not present

## 2013-02-15 DIAGNOSIS — R011 Cardiac murmur, unspecified: Secondary | ICD-10-CM | POA: Diagnosis not present

## 2013-02-15 DIAGNOSIS — I379 Nonrheumatic pulmonary valve disorder, unspecified: Secondary | ICD-10-CM | POA: Insufficient documentation

## 2013-02-15 DIAGNOSIS — R0609 Other forms of dyspnea: Secondary | ICD-10-CM | POA: Diagnosis not present

## 2013-02-15 DIAGNOSIS — R079 Chest pain, unspecified: Secondary | ICD-10-CM | POA: Insufficient documentation

## 2013-02-15 DIAGNOSIS — I359 Nonrheumatic aortic valve disorder, unspecified: Secondary | ICD-10-CM | POA: Diagnosis not present

## 2013-02-15 DIAGNOSIS — R0989 Other specified symptoms and signs involving the circulatory and respiratory systems: Secondary | ICD-10-CM | POA: Insufficient documentation

## 2013-02-15 NOTE — Progress Notes (Signed)
Echo performed. 

## 2013-02-17 ENCOUNTER — Ambulatory Visit (HOSPITAL_COMMUNITY): Payer: Medicare Other | Attending: Family Medicine | Admitting: Radiology

## 2013-02-17 ENCOUNTER — Encounter: Payer: Self-pay | Admitting: Cardiology

## 2013-02-17 VITALS — BP 131/69 | Ht 63.0 in | Wt 136.0 lb

## 2013-02-17 DIAGNOSIS — R0989 Other specified symptoms and signs involving the circulatory and respiratory systems: Secondary | ICD-10-CM | POA: Insufficient documentation

## 2013-02-17 DIAGNOSIS — Z8249 Family history of ischemic heart disease and other diseases of the circulatory system: Secondary | ICD-10-CM | POA: Insufficient documentation

## 2013-02-17 DIAGNOSIS — R0609 Other forms of dyspnea: Secondary | ICD-10-CM | POA: Diagnosis not present

## 2013-02-17 DIAGNOSIS — E785 Hyperlipidemia, unspecified: Secondary | ICD-10-CM | POA: Insufficient documentation

## 2013-02-17 DIAGNOSIS — R079 Chest pain, unspecified: Secondary | ICD-10-CM | POA: Diagnosis not present

## 2013-02-17 MED ORDER — TECHNETIUM TC 99M SESTAMIBI GENERIC - CARDIOLITE
11.0000 | Freq: Once | INTRAVENOUS | Status: AC | PRN
  Administered 2013-02-17: 11 via INTRAVENOUS

## 2013-02-17 MED ORDER — TECHNETIUM TC 99M SESTAMIBI GENERIC - CARDIOLITE
33.0000 | Freq: Once | INTRAVENOUS | Status: AC | PRN
  Administered 2013-02-17: 33 via INTRAVENOUS

## 2013-02-17 MED ORDER — REGADENOSON 0.4 MG/5ML IV SOLN
0.4000 mg | Freq: Once | INTRAVENOUS | Status: AC
  Administered 2013-02-17: 0.4 mg via INTRAVENOUS

## 2013-02-17 NOTE — Progress Notes (Signed)
[facility] SITE 3 NUCLEAR MED [address] [place], [place] [address] [phone]    Cardiology Nuclear Med Study  James Hardy is a 79 y.o. male     MRN : [id number]     DOB: 1928/09/22  Procedure Date: 02/17/2013  Nuclear Med Background Indication for Stress Test:  Evaluation for Ischemia History:  02/15/13 EF: 55-60%  AV thickening Cardiac Risk Factors: Family History - CAD and Lipids  Symptoms:  Chest Pain with Exertion and DOE   Nuclear Pre-Procedure Caffeine/Decaff Intake:  None > 12 hrs NPO After: 8:30pm   Lungs:  clear O2 Sat: 98/% on room air. IV 0.9% NS with Angio Cath:  22g  IV Site: R Antecubital x 1, tolerated well IV Started by:  Irean Hong, RN  Chest Size (in):  38 Cup Size: n/a  Height: 5\' 3"  (1.6 m)  Weight:  136 lb (61.689 kg)  BMI:  Body mass index is 24.1 kg/(m^2). Tech Comments:  N/A    Nuclear Med Study 1 or 2 day study: 1 day  Stress Test Type:  Lexiscan  Reading MD: Tobias Alexander, MD  Order Authorizing Provider:  Evelena Peat, MD  Resting Radionuclide: Technetium 55m Sestamibi  Resting Radionuclide Dose: 10.8 mCi   Stress Radionuclide:  Technetium 32m Sestamibi  Stress Radionuclide Dose: 32.8 mCi           Stress Protocol Rest HR: 59 Stress HR: 94  Rest BP: 131/69 Stress BP: 131/56  Exercise Time (min): n/a METS: n/a   Predicted Max HR: 138 bpm % Max HR: 68.12 bpm Rate Pressure Product: 46962   Dose of Adenosine (mg):  n/a Dose of Lexiscan: 0.4 mg  Dose of Atropine (mg): n/a Dose of Dobutamine: n/a mcg/kg/min (at max HR)  Stress Test Technologist: Milana Na, EMT-P  Nuclear Technologist:  Doyne Keel, CNMT     Rest Procedure:  Myocardial perfusion imaging was performed at rest 45 minutes following the intravenous administration of Technetium 21m Sestamibi. Rest ECG: SR, 1. AVB  Stress Procedure:  The patient received IV Lexiscan 0.4 mg over 15-seconds.  Technetium 22m Sestamibi injected at 30-seconds. This  patient had sob and chest tightness with the Lexiscan injection. Quantitative spect images were obtained after a 45 minute delay. Stress ECG: No significant change from baseline ECG  QPS Raw Data Images:  Normal; no motion artifact; normal heart/lung ratio. Diaphragmatic attenuation.  Stress Images:  Normal homogeneous uptake in all areas of the myocardium. Rest Images:  Normal homogeneous uptake in all areas of the myocardium. Subtraction (SDS):  No evidence of ischemia. Transient Ischemic Dilatation (Normal <1.22):  0.96 Lung/Heart Ratio (Normal <0.45):  0.31  Quantitative Gated Spect Images QGS EDV:  128 ml QGS ESV:  45 ml  Impression Exercise Capacity:  Fair exercise capacity. BP Response:  Normal blood pressure response. Clinical Symptoms:  There is dyspnea. ECG Impression:  No significant ST segment change suggestive of ischemia. Comparison with Prior Nuclear Study: No images to compare  Overall Impression:  Normal stress nuclear study.  LV Ejection Fraction: 65%.  LV Wall Motion:  NL LV Function; NL Wall Motion  Tobias Alexander, Rexene Edison 02/17/2013

## 2013-02-22 NOTE — Addendum Note (Signed)
Addended by: Kristian Covey on: 02/22/2013 08:27 AM   Modules accepted: Orders

## 2013-03-04 ENCOUNTER — Encounter: Payer: Self-pay | Admitting: Cardiology

## 2013-03-04 DIAGNOSIS — I359 Nonrheumatic aortic valve disorder, unspecified: Secondary | ICD-10-CM | POA: Diagnosis not present

## 2013-03-04 DIAGNOSIS — R0789 Other chest pain: Secondary | ICD-10-CM | POA: Diagnosis not present

## 2013-03-04 DIAGNOSIS — K219 Gastro-esophageal reflux disease without esophagitis: Secondary | ICD-10-CM | POA: Diagnosis not present

## 2013-03-04 DIAGNOSIS — E785 Hyperlipidemia, unspecified: Secondary | ICD-10-CM | POA: Diagnosis not present

## 2013-03-04 DIAGNOSIS — R0602 Shortness of breath: Secondary | ICD-10-CM | POA: Diagnosis not present

## 2013-03-04 DIAGNOSIS — G4733 Obstructive sleep apnea (adult) (pediatric): Secondary | ICD-10-CM | POA: Diagnosis not present

## 2013-03-15 ENCOUNTER — Other Ambulatory Visit: Payer: Self-pay | Admitting: Family Medicine

## 2013-03-23 ENCOUNTER — Encounter: Payer: Self-pay | Admitting: Cardiology

## 2013-03-23 DIAGNOSIS — R0602 Shortness of breath: Secondary | ICD-10-CM | POA: Diagnosis not present

## 2013-03-23 DIAGNOSIS — G4733 Obstructive sleep apnea (adult) (pediatric): Secondary | ICD-10-CM | POA: Diagnosis not present

## 2013-03-23 DIAGNOSIS — E785 Hyperlipidemia, unspecified: Secondary | ICD-10-CM | POA: Diagnosis not present

## 2013-03-23 DIAGNOSIS — I359 Nonrheumatic aortic valve disorder, unspecified: Secondary | ICD-10-CM | POA: Diagnosis not present

## 2013-03-23 DIAGNOSIS — R0789 Other chest pain: Secondary | ICD-10-CM | POA: Diagnosis not present

## 2013-03-23 DIAGNOSIS — K219 Gastro-esophageal reflux disease without esophagitis: Secondary | ICD-10-CM | POA: Diagnosis not present

## 2013-03-23 NOTE — Progress Notes (Signed)
Patient ID: QUAME SPRATLIN, male   DOB: Sep 06, 1928, 79 y.o.   MRN: [id number]   Joann, Kulpa  Date of visit:  03/23/2013 DOB:  October 08, 1928    Age:  79 yrs. Medical record number:  [id number] Account number:  [id number] Primary Care Provider: Evelena Peat ____________________________ CURRENT DIAGNOSES  1. Dyspnea  2. Chest Pain  3. Aortic Valve-Stenosis  4. CAD  5. Hyperlipidemia  6. Sleep Apnea ____________________________ ALLERGIES  Neosporin, Rash ____________________________ MEDICATIONS  1. omeprazole 20 mg capsule,delayed release, 1 p.o. daily  2. finasteride 5 mg tablet, 1 p.o. daily  3. doxazosin 8 mg tablet, 1 p.o. daily  4. aspirin 81 mg chewable tablet, 1 p.o. daily  5. metoprolol succinate ER 25 mg tablet,extended release 24 hr, 1 p.o. daily ____________________________ CHIEF COMPLAINTS  Followup of Aortic Valve-Stenosis  Followup of Dyspnea ____________________________ HISTORY OF PRESENT ILLNESS  Patient returns for cardiac followup. I reviewed his symptoms carefully with him and his family today. He complains of some dyspnea and mild tightness on walking up a hill but states that since starting the metoprolol that the tightness has improved. He is able to do floor exercises, yard work, and other activities around his house without limitation. He has a previous negative myocardial perfusion scan. I reviewed the previous echocardiogram and he appears to have only moderate aortic stenosis. His wife reports that he had one episode of dizziness when he was up on a stool changing a fluorescent light bulb but for the most part has had no syncope or other symptoms. He denies PND, orthopnea or claudication ____________________________ PAST HISTORY  Past Medical Illnesses:  hyperlipidemia, GERD, sleep apnea, BPH;  Cardiovascular Illnesses:  aortic stenosis, CAD;  Surgical Procedures:  hemmoroidectomy;  Cardiology Procedures-Invasive:  no history of prior cardiac procedures;   Cardiology Procedures-Noninvasive:  lexiscan cardiolite November 2014, echocardiogram November 2014;  LVEF of 60% documented via echocardiogram on 02/17/2013,   ____________________________ CARDIO-PULMONARY TEST DATES EKG Date:  03/04/2013;  Nuclear Study Date:  02/17/2013;  Echocardiography Date: 02/15/2013;   ____________________________ FAMILY HISTORY Brother -- Brother dead, Unknown Disease Brother -- Brother dead, Myocardial infarction Father -- Father dead, Chronic obstructive lung disease, Influenza/Pneumonia Mother -- Mother dead, Coronary Artery Disease, Asthma Sister -- Sister alive with problem, Alzheimer's disease Sister -- Sister alive and well Sister -- Sister alive and well ____________________________ SOCIAL HISTORY Alcohol Use:  no alcohol use;  Smoking:  never smoked;  Diet:  regular diet;  Lifestyle:  married;  Exercise:  walking 3 days per week and exercise strength for back;  Occupation:  retired Camera operator;  Residence:  lives with wife;   ____________________________ PHYSICAL EXAMINATION VITAL SIGNS  Blood Pressure:  104/60 Sitting, Left arm, regular cuff  , 100/60 Standing, Left arm and regular cuff   Pulse:  64/min. Weight:  140.00 lbs. Height:  63"BMI: 25  Constitutional:  pleasant white male in no acute distress Skin:  scattered ecchymosis present Head:  normocephalic, normal hair pattern, no masses or tenderness ENT:  ears, nose and throat reveal no gross abnormalities.  Dentition good. Neck:  supple, without massess. No JVD, thyromegaly or carotid bruits. Carotid upstroke normal. Chest:  normal symmetry, clear to auscultation Cardiac:  regular rhythm, normal S1 and S2, no S3 or S4, grade 1/6 systolic murmur at aortic area radiating to neck Peripheral Pulses:  the femoral,dorsalis pedis, and posterior tibial pulses are full and equal bilaterally with no bruits auscultated. Extremities & Back:  1+ edema, bilateral venous insufficiency changes  present Neurological:  no gross motor or sensory deficits noted, affect appropriate, oriented x3. ____________________________ MOST RECENT LIPID PANEL 1. Moderate aortic stenosis 2. Coronary artery disease as manifested by coronary calcification on CT scan 3. Hyperlipidemia 4. Sleep apnea  Recommendations:  At this point in time I would favor continued observation since his largely asymptomatic except with what I would consider stable anginal walking up hills. His aortic stenosis is only moderate. He was nonischemic on recent myocardial perfusion scan and at this point he is happy with his quality of life and would prefer to be managed medically. I will see him in followup in 3 months. ____________________________ IMPRESSIONS/PLAN   (Enter Doctor Dictated Impressions Here) ____________________________ TODAYS ORDERS  1. Return Visit: 3 months                       ____________________________ Cardiology Physician:  Darden Palmer MD [facility]

## 2013-03-23 NOTE — Progress Notes (Signed)
[name], male   DOB: 1928/11/21, 79 y.o.   MRN: [id number]   James Hardy, James Hardy  Date of visit:  03/04/2013 DOB:  04-11-1928    Age:  79 yrs. Medical record number:  [id number]     Account number:  [id number] Primary Care Provider: Evelena Peat ____________________________ CURRENT DIAGNOSES  1. Dyspnea  2. Chest Pain  3. Aortic Valve-Stenosis  4. GERD  5. Hyperlipidemia  6. Sleep Apnea ____________________________ ALLERGIES  Neosporin, Rash ____________________________ MEDICATIONS  1. omeprazole 20 mg capsule,delayed release, 1 p.o. daily  2. finasteride 5 mg tablet, 1 p.o. daily  3. doxazosin 8 mg tablet, 1 p.o. daily  4. aspirin 81 mg chewable tablet, 1 p.o. daily  5. metoprolol succinate ER 25 mg tablet,extended release 24 hr, 1 p.o. daily ____________________________ CHIEF COMPLAINTS  Began 3+ months ago  dyspnea and chest pain with exertion ____________________________ HISTORY OF PRESENT ILLNESS Patient seen for cardiac consultation. The patient has previously been in good health but was seen by his primary physician recently with a history of exertional shortness of breath and chest tightness. He is able to do floor exercises well without symptoms but as noted a several month history of exertional shortness of breath when walking up slight hills accompanied with midsternal chest tightness. He typically will be relieved when he rests. He had an echocardiogram that showed moderate aortic stenosis with a peak aortic valve gradient of 46 mm and a mean gradient in the 30s. He had a previous CT scan done several years ago that showed some coronary calcification. It predominantly involved the LAD. He had a Lexiscan myocardial perfusion study that showed no evidence of myocardial ischemia. I was asked to further assess him. He has not had any significant syncope but does have occasional orthostatic lightheadedness. He has a long-standing history of sleep apnea and wears CPAP. He  denies PND, orthopnea or edema. He is able to walk up stairs in his house without significant symptoms. ____________________________ PAST HISTORY  Past Medical Illnesses:  hyperlipidemia, GERD, sleep apnea, BPH;  Cardiovascular Illnesses:  aortic stenosis;  Surgical Procedures:  hemmoroidectomy;  Cardiology Procedures-Invasive:  no history of prior cardiac procedures;  Cardiology Procedures-Noninvasive:  lexiscan cardiolite November 2014, echocardiogram November 2014;  LVEF not documented,   ____________________________ Jacqulyn Bath TEST DATES EKG Date:  03/04/2013;  Nuclear Study Date:  02/17/2013;  Echocardiography Date: 02/15/2013;   ____________________________ FAMILY HISTORY Brother -- Brother dead, Unknown Disease Brother -- Brother dead, Myocardial infarction Father -- Father dead, Chronic obstructive lung disease, Influenza/Pneumonia Mother -- Mother dead, Coronary Artery Disease, Asthma Sister -- Sister alive with problem, Alzheimer's disease Sister -- Sister alive and well Sister -- Sister alive and well ____________________________ SOCIAL HISTORY Alcohol Use:  no alcohol use;  Smoking:  never smoked;  Diet:  regular diet;  Lifestyle:  married;  Exercise:  walking 3 days per week and exercise strength for back;  Occupation:  retired Camera operator;  Residence:  lives with wife;   ____________________________ REVIEW OF SYSTEMS General:  denies recent weight change, fatique or change in exercise tolerance.  Integumentary:no rashes or new skin lesions. Eyes: cataracts Ears, Nose, Throat, Mouth:  denies any hearing loss, epistaxis, hoarseness or difficulty speaking. Respiratory: dyspnea with exertion Cardiovascular:  please review HPI Abdominal: history of GERD and dyspepsia Genitourinary-Male: nocturia  Musculoskeletal:  chronic back pain Neurological:  denies headaches, stroke, or TIA Psychiatric:  denies depession or anxiety. Endocrine: denies any history of weight change, heat/cold  intolerance, polydipsia, or  polyuria Hematological/Immunologic:  denies any food allergies, bleeding disorders. ____________________________ PHYSICAL EXAMINATION VITAL SIGNS  Blood Pressure:  100/60 Sitting, Left arm, regular cuff  , 104/60 Standing, Left arm and regular cuff   Pulse:  64/min. Weight:  141.00 lbs. Height:  63"BMI: 25  Constitutional:  pleasant white male in no acute distress Skin:  scattered ecchymosis present Head:  normocephalic, normal hair pattern, no masses or tenderness Eyes:  EOMS Intact, PERRLA, C and S clear, Funduscopic exam not done. ENT:  ears, nose and throat reveal no gross abnormalities.  Dentition good. Neck:  supple, without massess. No JVD, thyromegaly or carotid bruits. Carotid upstroke normal. Chest:  normal symmetry, clear to auscultation Cardiac:  regular rhythm, normal S1 and S2, no S3 or S4, grade 1/6 systolic murmur at aortic area radiating to neck Abdomen:  abdomen soft,non-tender, no masses, no hepatospenomegaly, or aneurysm noted Peripheral Pulses:  the femoral,dorsalis pedis, and posterior tibial pulses are full and equal bilaterally with no bruits auscultated. Extremities & Back:  1+ edema, bilateral venous insufficiency changes present Neurological:  no gross motor or sensory deficits noted, affect appropriate, oriented x3. ____________________________ IMPRESSIONS/PLAN  1. Exertional dyspnea and chest tightness in the presence of aortic stenosis as well as a prior history of coronary disease but with a negative LexiScan myocardial perfusion study 2. Hyperlipidemia 3. History of reflux 4. Moderate to severe aortic stenosis  Recommendations:  The patient appears to have at least moderate aortic stenosis and I would like to review the actual echocardiogram to determine if it potentially could be more severe. He describes his symptoms as being present over the past couple of months. He appears to be active and appears younger than his stated  age. I would recommend that he start a low-dose of beta blocker and I will review the echocardiogram and see him back in 2 weeks. If he continues to have symptoms it may be best to proceed with catheterization to be sure he does not have more severe aortic stenosis in light of his symptoms. EKG shows an IV conduction delay ____________________________ TODAYS ORDERS  1. Return Visit: 2 weeks  2. 12 Lead EKG: Today                       ____________________________ Cardiology Physician:  Darden Palmer MD [facility]

## 2013-03-30 DIAGNOSIS — H2510 Age-related nuclear cataract, unspecified eye: Secondary | ICD-10-CM | POA: Diagnosis not present

## 2013-03-30 DIAGNOSIS — H04129 Dry eye syndrome of unspecified lacrimal gland: Secondary | ICD-10-CM | POA: Diagnosis not present

## 2013-03-30 DIAGNOSIS — H02409 Unspecified ptosis of unspecified eyelid: Secondary | ICD-10-CM | POA: Diagnosis not present

## 2013-04-02 DIAGNOSIS — N401 Enlarged prostate with lower urinary tract symptoms: Secondary | ICD-10-CM | POA: Diagnosis not present

## 2013-04-02 DIAGNOSIS — R339 Retention of urine, unspecified: Secondary | ICD-10-CM | POA: Diagnosis not present

## 2013-04-02 DIAGNOSIS — N139 Obstructive and reflux uropathy, unspecified: Secondary | ICD-10-CM | POA: Diagnosis not present

## 2013-05-18 ENCOUNTER — Ambulatory Visit: Payer: Medicare Other | Admitting: Family Medicine

## 2013-05-21 ENCOUNTER — Ambulatory Visit: Payer: Medicare Other | Admitting: Family Medicine

## 2013-05-25 ENCOUNTER — Encounter: Payer: Self-pay | Admitting: Family Medicine

## 2013-05-25 ENCOUNTER — Ambulatory Visit (INDEPENDENT_AMBULATORY_CARE_PROVIDER_SITE_OTHER): Payer: Medicare Other | Admitting: Family Medicine

## 2013-05-25 ENCOUNTER — Ambulatory Visit (INDEPENDENT_AMBULATORY_CARE_PROVIDER_SITE_OTHER)
Admission: RE | Admit: 2013-05-25 | Discharge: 2013-05-25 | Disposition: A | Discharge status: Home / Self Care | Payer: Medicare Other | Source: Ambulatory Visit | Attending: Family Medicine | Admitting: Family Medicine

## 2013-05-25 VITALS — BP 128/72 | HR 60 | Wt 144.0 lb

## 2013-05-25 DIAGNOSIS — IMO0002 Reserved for concepts with insufficient information to code with codable children: Secondary | ICD-10-CM

## 2013-05-25 DIAGNOSIS — M5416 Radiculopathy, lumbar region: Secondary | ICD-10-CM

## 2013-05-25 DIAGNOSIS — M5137 Other intervertebral disc degeneration, lumbosacral region: Secondary | ICD-10-CM | POA: Diagnosis not present

## 2013-05-25 MED ORDER — PREDNISONE 10 MG PO TABS: ORAL_TABLET | ORAL | Status: DC

## 2013-05-25 NOTE — Patient Instructions (Signed)

## 2013-05-25 NOTE — Progress Notes (Signed)
Subjective:    [name], male    DOB: [birth year], 80 y.o.   MRN: [id number]  Back Pain Associated symptoms include numbness. Pertinent negatives include no abdominal pain, chest pain, dysuria, fever or weakness.   Patient seen with left lumbar radiculopathy pain. Onset about 5-6 weeks ago. No specific injury but started after doing some lifting of wood. He is generally very active. He has pain which comes and goes and is described as sometimes sharp and worse severity 8/10. Pain is left lower lumbar region with some left radiculopathy symptoms down to the calf. Has occasional numbness in the left lateral thigh region. No weakness. No loss of bladder or bowel control. Ibuprofen with no relief. Worse with standing. Denies any recent fevers or chills. No appetite or weight changes.  Past Medical History  Diagnosis Date  . HYPERLIPIDEMIA 04/11/2009    Qualifier: Diagnosis of  By: Joyce Gross    . CERUMEN IMPACTION 04/11/2009    Qualifier: Diagnosis of  By: Joyce Gross    . INTERNAL HEMORRHOIDS 08/28/2006    Qualifier: Diagnosis of  By: Jerral Ralph    . APHTHOUS ULCERS 12/19/2009    Qualifier: Diagnosis of  By: [name] MD, Johm Pfannenstiel    . ANGULAR CHEILITIS 04/11/2009    Qualifier: Diagnosis of  By: Joyce Gross    . GERD 12/02/2008    Qualifier: Diagnosis of  By: [name] MD, Mio Schellinger    . DIVERTICULOSIS, COLON 08/28/2006    Qualifier: Diagnosis of  By: Jerral Ralph    . OSTEOPOROSIS 08/21/2007    Qualifier: Diagnosis of  By: Jerral Ralph    . SLEEP APNEA 08/21/2007    Qualifier: Diagnosis of  By: Jerral Ralph    . COLONIC POLYPS, HX OF 12/02/2008    Qualifier: Diagnosis of  By: [name] MD, Hoyte Ziebell    . COLITIS, HX OF 08/21/2007    Qualifier: Diagnosis of  By: Jerral Ralph    . BENIGN PROSTATIC HYPERTROPHY, HX OF 08/21/2007    Qualifier: Diagnosis of  By: Jerral Ralph     Past Surgical History  Procedure Laterality Date    . Hemorrhoid surgery  2001    reports that he has never smoked. He does not have any smokeless tobacco history on file. He reports that he does not drink alcohol or use illicit drugs. family history includes Asthma in his mother; Heart disease in his other. Allergies  Allergen Reactions  . Neosporin [Neomycin-Bacitracin Zn-Polymyx] Rash      Review of Systems  Constitutional: Negative for fever, activity change and appetite change.  Respiratory: Negative for cough and shortness of breath.   Cardiovascular: Negative for chest pain and leg swelling.  Gastrointestinal: Negative for vomiting and abdominal pain.  Genitourinary: Negative for dysuria, hematuria and flank pain.  Musculoskeletal: Positive for back pain. Negative for joint swelling.  Neurological: Positive for numbness. Negative for weakness.       Objective:   Physical Exam  Constitutional: He appears well-developed and well-nourished.  Cardiovascular: Normal rate.   Pulmonary/Chest: Effort normal and breath sounds normal. No respiratory distress. He has no wheezes. He has no rales.  Musculoskeletal: He exhibits no edema.  Straight leg raise are negative.  Neurological:  Deep tendon reflexes possibly slightly diminished left knee compared to right. Trace Achilles bilaterally. No strength deficits.          Assessment & Plan:  Left lumbar radiculopathy symptoms. Possible slight diminished left knee reflex otherwise nonfocal neuro exam. Cautious  prednisone taper with possible side effects reviewed. Set up x-rays given duration of symptoms. Consider physical therapy trial. May need MRI to further assess

## 2013-05-25 NOTE — Progress Notes (Signed)
Pre visit review using our clinic review tool, if applicable. No additional management support is needed unless otherwise documented below in the visit note. 

## 2013-05-27 ENCOUNTER — Other Ambulatory Visit: Payer: Self-pay | Admitting: Family Medicine

## 2013-05-27 DIAGNOSIS — M5416 Radiculopathy, lumbar region: Secondary | ICD-10-CM

## 2013-06-04 DIAGNOSIS — M545 Low back pain, unspecified: Secondary | ICD-10-CM | POA: Diagnosis not present

## 2013-06-04 DIAGNOSIS — IMO0002 Reserved for concepts with insufficient information to code with codable children: Secondary | ICD-10-CM | POA: Diagnosis not present

## 2013-06-08 DIAGNOSIS — IMO0002 Reserved for concepts with insufficient information to code with codable children: Secondary | ICD-10-CM | POA: Diagnosis not present

## 2013-06-08 DIAGNOSIS — M545 Low back pain, unspecified: Secondary | ICD-10-CM | POA: Diagnosis not present

## 2013-06-11 DIAGNOSIS — M545 Low back pain, unspecified: Secondary | ICD-10-CM | POA: Diagnosis not present

## 2013-06-11 DIAGNOSIS — IMO0002 Reserved for concepts with insufficient information to code with codable children: Secondary | ICD-10-CM | POA: Diagnosis not present

## 2013-06-15 DIAGNOSIS — M545 Low back pain, unspecified: Secondary | ICD-10-CM | POA: Diagnosis not present

## 2013-06-15 DIAGNOSIS — IMO0002 Reserved for concepts with insufficient information to code with codable children: Secondary | ICD-10-CM | POA: Diagnosis not present

## 2013-06-17 DIAGNOSIS — M545 Low back pain, unspecified: Secondary | ICD-10-CM | POA: Diagnosis not present

## 2013-06-17 DIAGNOSIS — IMO0002 Reserved for concepts with insufficient information to code with codable children: Secondary | ICD-10-CM | POA: Diagnosis not present

## 2013-06-18 DIAGNOSIS — K219 Gastro-esophageal reflux disease without esophagitis: Secondary | ICD-10-CM | POA: Diagnosis not present

## 2013-06-18 DIAGNOSIS — R0602 Shortness of breath: Secondary | ICD-10-CM | POA: Diagnosis not present

## 2013-06-18 DIAGNOSIS — G4733 Obstructive sleep apnea (adult) (pediatric): Secondary | ICD-10-CM | POA: Diagnosis not present

## 2013-06-18 DIAGNOSIS — E785 Hyperlipidemia, unspecified: Secondary | ICD-10-CM | POA: Diagnosis not present

## 2013-06-18 DIAGNOSIS — R0789 Other chest pain: Secondary | ICD-10-CM | POA: Diagnosis not present

## 2013-06-18 DIAGNOSIS — I359 Nonrheumatic aortic valve disorder, unspecified: Secondary | ICD-10-CM | POA: Diagnosis not present

## 2013-06-22 DIAGNOSIS — IMO0002 Reserved for concepts with insufficient information to code with codable children: Secondary | ICD-10-CM | POA: Diagnosis not present

## 2013-06-22 DIAGNOSIS — M545 Low back pain, unspecified: Secondary | ICD-10-CM | POA: Diagnosis not present

## 2013-06-24 DIAGNOSIS — M545 Low back pain, unspecified: Secondary | ICD-10-CM | POA: Diagnosis not present

## 2013-06-24 DIAGNOSIS — IMO0002 Reserved for concepts with insufficient information to code with codable children: Secondary | ICD-10-CM | POA: Diagnosis not present

## 2013-06-29 DIAGNOSIS — M545 Low back pain, unspecified: Secondary | ICD-10-CM | POA: Diagnosis not present

## 2013-06-29 DIAGNOSIS — IMO0002 Reserved for concepts with insufficient information to code with codable children: Secondary | ICD-10-CM | POA: Diagnosis not present

## 2013-07-02 DIAGNOSIS — IMO0002 Reserved for concepts with insufficient information to code with codable children: Secondary | ICD-10-CM | POA: Diagnosis not present

## 2013-07-02 DIAGNOSIS — M545 Low back pain, unspecified: Secondary | ICD-10-CM | POA: Diagnosis not present

## 2013-09-21 DIAGNOSIS — M545 Low back pain, unspecified: Secondary | ICD-10-CM | POA: Diagnosis not present

## 2013-09-21 DIAGNOSIS — IMO0002 Reserved for concepts with insufficient information to code with codable children: Secondary | ICD-10-CM | POA: Diagnosis not present

## 2013-11-18 DIAGNOSIS — Z85828 Personal history of other malignant neoplasm of skin: Secondary | ICD-10-CM | POA: Diagnosis not present

## 2013-11-18 DIAGNOSIS — L570 Actinic keratosis: Secondary | ICD-10-CM | POA: Diagnosis not present

## 2013-11-18 DIAGNOSIS — D235 Other benign neoplasm of skin of trunk: Secondary | ICD-10-CM | POA: Diagnosis not present

## 2013-11-18 DIAGNOSIS — L820 Inflamed seborrheic keratosis: Secondary | ICD-10-CM | POA: Diagnosis not present

## 2013-11-25 ENCOUNTER — Ambulatory Visit (INDEPENDENT_AMBULATORY_CARE_PROVIDER_SITE_OTHER): Payer: Medicare Other | Admitting: Otolaryngology

## 2013-11-25 DIAGNOSIS — H6120 Impacted cerumen, unspecified ear: Secondary | ICD-10-CM

## 2013-12-28 ENCOUNTER — Ambulatory Visit (INDEPENDENT_AMBULATORY_CARE_PROVIDER_SITE_OTHER): Payer: Medicare Other | Admitting: Family Medicine

## 2013-12-28 DIAGNOSIS — K219 Gastro-esophageal reflux disease without esophagitis: Secondary | ICD-10-CM | POA: Diagnosis not present

## 2013-12-28 DIAGNOSIS — G4733 Obstructive sleep apnea (adult) (pediatric): Secondary | ICD-10-CM | POA: Diagnosis not present

## 2013-12-28 DIAGNOSIS — Z23 Encounter for immunization: Secondary | ICD-10-CM

## 2013-12-28 DIAGNOSIS — I350 Nonrheumatic aortic (valve) stenosis: Secondary | ICD-10-CM | POA: Diagnosis not present

## 2013-12-28 DIAGNOSIS — R0602 Shortness of breath: Secondary | ICD-10-CM | POA: Diagnosis not present

## 2013-12-28 DIAGNOSIS — I209 Angina pectoris, unspecified: Secondary | ICD-10-CM | POA: Diagnosis not present

## 2013-12-28 DIAGNOSIS — E785 Hyperlipidemia, unspecified: Secondary | ICD-10-CM | POA: Diagnosis not present

## 2014-03-11 ENCOUNTER — Other Ambulatory Visit: Payer: Self-pay | Admitting: Family Medicine

## 2014-04-04 DIAGNOSIS — R339 Retention of urine, unspecified: Secondary | ICD-10-CM | POA: Diagnosis not present

## 2014-04-04 DIAGNOSIS — R351 Nocturia: Secondary | ICD-10-CM | POA: Diagnosis not present

## 2014-04-04 DIAGNOSIS — N401 Enlarged prostate with lower urinary tract symptoms: Secondary | ICD-10-CM | POA: Diagnosis not present

## 2014-04-11 DIAGNOSIS — I209 Angina pectoris, unspecified: Secondary | ICD-10-CM | POA: Diagnosis not present

## 2014-04-20 DIAGNOSIS — H02403 Unspecified ptosis of bilateral eyelids: Secondary | ICD-10-CM | POA: Diagnosis not present

## 2014-04-20 DIAGNOSIS — H04123 Dry eye syndrome of bilateral lacrimal glands: Secondary | ICD-10-CM | POA: Diagnosis not present

## 2014-04-20 DIAGNOSIS — H2513 Age-related nuclear cataract, bilateral: Secondary | ICD-10-CM | POA: Diagnosis not present

## 2014-05-06 ENCOUNTER — Encounter: Payer: Self-pay | Admitting: Family Medicine

## 2014-06-03 ENCOUNTER — Other Ambulatory Visit: Payer: Self-pay | Admitting: Family Medicine

## 2014-06-21 ENCOUNTER — Encounter: Payer: Self-pay | Admitting: Family Medicine

## 2014-06-21 ENCOUNTER — Ambulatory Visit (INDEPENDENT_AMBULATORY_CARE_PROVIDER_SITE_OTHER): Payer: Medicare Other | Admitting: Family Medicine

## 2014-06-21 VITALS — BP 130/74 | HR 58 | Temp 97.4°F | Wt 142.0 lb

## 2014-06-21 DIAGNOSIS — R5383 Other fatigue: Secondary | ICD-10-CM | POA: Diagnosis not present

## 2014-06-21 DIAGNOSIS — R682 Dry mouth, unspecified: Secondary | ICD-10-CM | POA: Diagnosis not present

## 2014-06-21 DIAGNOSIS — G4710 Hypersomnia, unspecified: Secondary | ICD-10-CM

## 2014-06-21 DIAGNOSIS — R400 Somnolence: Secondary | ICD-10-CM

## 2014-06-21 LAB — CBC WITH DIFFERENTIAL/PLATELET
Basophils Absolute: 0 10*3/uL (ref 0.0–0.1)
Basophils Relative: 0.4 % (ref 0.0–3.0)
EOS ABS: 0.1 10*3/uL (ref 0.0–0.7)
Eosinophils Relative: 1.2 % (ref 0.0–5.0)
HCT: 32.4 % — ABNORMAL LOW (ref 39.0–52.0)
Hemoglobin: 11.1 g/dL — ABNORMAL LOW (ref 13.0–17.0)
LYMPHS PCT: 19.3 % (ref 12.0–46.0)
Lymphs Abs: 1.4 10*3/uL (ref 0.7–4.0)
MCHC: 34.2 g/dL (ref 30.0–36.0)
MCV: 96.7 fl (ref 78.0–100.0)
MONO ABS: 1.6 10*3/uL — AB (ref 0.1–1.0)
MONOS PCT: 21.8 % — AB (ref 3.0–12.0)
NEUTROS ABS: 4.1 10*3/uL (ref 1.4–7.7)
NEUTROS PCT: 57.3 % (ref 43.0–77.0)
PLATELETS: 157 10*3/uL (ref 150.0–400.0)
RBC: 3.35 Mil/uL — AB (ref 4.22–5.81)
RDW: 14.6 % (ref 11.5–15.5)
WBC: 7.2 10*3/uL (ref 4.0–10.5)

## 2014-06-21 LAB — HEPATIC FUNCTION PANEL
ALT: 14 U/L (ref 0–53)
AST: 22 U/L (ref 0–37)
Albumin: 3.6 g/dL (ref 3.5–5.2)
Alkaline Phosphatase: 37 U/L — ABNORMAL LOW (ref 39–117)
BILIRUBIN TOTAL: 0.6 mg/dL (ref 0.2–1.2)
Bilirubin, Direct: 0.2 mg/dL (ref 0.0–0.3)
Total Protein: 6.2 g/dL (ref 6.0–8.3)

## 2014-06-21 LAB — BASIC METABOLIC PANEL
BUN: 23 mg/dL (ref 6–23)
CALCIUM: 8.8 mg/dL (ref 8.4–10.5)
CO2: 25 meq/L (ref 19–32)
Chloride: 110 mEq/L (ref 96–112)
Creatinine, Ser: 1.1 mg/dL (ref 0.40–1.50)
GFR: 67.83 mL/min (ref >60.00)
GLUCOSE: 77 mg/dL (ref 70–99)
Potassium: 4.9 mEq/L (ref 3.5–5.1)
Sodium: 138 mEq/L (ref 135–145)

## 2014-06-21 LAB — TSH: TSH: 3.57 u[IU]/mL (ref 0.35–4.50)

## 2014-06-21 MED ORDER — OMEPRAZOLE 20 MG PO CPDR: DELAYED_RELEASE_CAPSULE | ORAL | Status: DC

## 2014-06-21 NOTE — Patient Instructions (Signed)
Sleep Apnea  Sleep apnea is a sleep disorder characterized by abnormal pauses in breathing while you sleep. When your breathing pauses, the level of oxygen in your blood decreases. This causes you to move out of deep sleep and into light sleep. As a result, your quality of sleep is poor, and the system that carries your blood throughout your body (cardiovascular system) experiences stress. If sleep apnea remains untreated, the following conditions can develop:  High blood pressure (hypertension).  Coronary artery disease.  Inability to achieve or maintain an erection (impotence).  Impairment of your thought process (cognitive dysfunction). There are three types of sleep apnea: 1. Obstructive sleep apnea--Pauses in breathing during sleep because of a blocked airway. 2. Central sleep apnea--Pauses in breathing during sleep because the area of the brain that controls your breathing does not send the correct signals to the muscles that control breathing. 3. Mixed sleep apnea--A combination of both obstructive and central sleep apnea. RISK FACTORS The following risk factors can increase your risk of developing sleep apnea:  Being overweight.  Smoking.  Having narrow passages in your nose and throat.  Being of older age.  Being male.  Alcohol use.  Sedative and tranquilizer use.  Ethnicity. Among individuals younger than 35 years, African Americans are at increased risk of sleep apnea. SYMPTOMS   Difficulty staying asleep.  Daytime sleepiness and fatigue.  Loss of energy.  Irritability.  Loud, heavy snoring.  Morning headaches.  Trouble concentrating.  Forgetfulness.  Decreased interest in sex. DIAGNOSIS  In order to diagnose sleep apnea, your caregiver will perform a physical examination. Your caregiver may suggest that you take a home sleep test. Your caregiver may also recommend that you spend the night in a sleep lab. In the sleep lab, several monitors record  information about your heart, lungs, and brain while you sleep. Your leg and arm movements and blood oxygen level are also recorded. TREATMENT The following actions may help to resolve mild sleep apnea:  Sleeping on your side.   Using a decongestant if you have nasal congestion.   Avoiding the use of depressants, including alcohol, sedatives, and narcotics.   Losing weight and modifying your diet if you are overweight. There also are devices and treatments to help open your airway:  Oral appliances. These are custom-made mouthpieces that shift your lower jaw forward and slightly open your bite. This opens your airway.  Devices that create positive airway pressure. This positive pressure "splints" your airway open to help you breathe better during sleep. The following devices create positive airway pressure:  Continuous positive airway pressure (CPAP) device. The CPAP device creates a continuous level of air pressure with an air pump. The air is delivered to your airway through a mask while you sleep. This continuous pressure keeps your airway open.  Nasal expiratory positive airway pressure (EPAP) device. The EPAP device creates positive air pressure as you exhale. The device consists of single-use valves, which are inserted into each nostril and held in place by adhesive. The valves create very little resistance when you inhale but create much more resistance when you exhale. That increased resistance creates the positive airway pressure. This positive pressure while you exhale keeps your airway open, making it easier to breath when you inhale again.  Bilevel positive airway pressure (BPAP) device. The BPAP device is used mainly in patients with central sleep apnea. This device is similar to the CPAP device because it also uses an air pump to deliver continuous air pressure   through a mask. However, with the BPAP machine, the pressure is set at two different levels. The pressure when you  exhale is lower than the pressure when you inhale.  Surgery. Typically, surgery is only done if you cannot comply with less invasive treatments or if the less invasive treatments do not improve your condition. Surgery involves removing excess tissue in your airway to create a wider passage way. Document Released: 03/01/2002 Document Revised: 07/06/2012 Document Reviewed: 07/18/2011 [employer] Patient Information 2015 [employer]. This information is not intended to replace advice given to you by your health care provider. Make sure you discuss any questions you have with your health care provider.  

## 2014-06-21 NOTE — Progress Notes (Signed)
Pre visit review using our clinic review tool, if applicable. No additional management support is needed unless otherwise documented below in the visit note. 

## 2014-06-21 NOTE — Progress Notes (Signed)
Subjective:    [name], male    DOB: June 24, 1928, 81 y.o.   MRN: [id number]  HPI Patient has chronic problems including history of obstructive sleep apnea, hyperlipidemia, GERD. He seen today with several month history of increased daytime somnolence. He has CPAP and apparently this has not been studied or reassessed in several years. He's recently had some issues with driving where he became very sleepy or any activity where he is not staying actively engaged in physical activity such as reading. He is also complaining of frequent dry mouth. No urine frequency. No appetite or weight changes. Denies any dry eye symptoms. His current medications include Cardura, finasteride, metoprolol, and omeprazole.  Past Medical History  Diagnosis Date  . HYPERLIPIDEMIA 04/11/2009    Qualifier: Diagnosis of  By: Joyce Gross    . CERUMEN IMPACTION 04/11/2009    Qualifier: Diagnosis of  By: Joyce Gross    . INTERNAL HEMORRHOIDS 08/28/2006    Qualifier: Diagnosis of  By: Jerral Ralph    . APHTHOUS ULCERS 12/19/2009    Qualifier: Diagnosis of  By: [name] MD, Makisha Marrin    . ANGULAR CHEILITIS 04/11/2009    Qualifier: Diagnosis of  By: Joyce Gross    . GERD 12/02/2008    Qualifier: Diagnosis of  By: [name] MD, Arelly Whittenberg    . DIVERTICULOSIS, COLON 08/28/2006    Qualifier: Diagnosis of  By: Jerral Ralph    . OSTEOPOROSIS 08/21/2007    Qualifier: Diagnosis of  By: Jerral Ralph    . SLEEP APNEA 08/21/2007    Qualifier: Diagnosis of  By: Jerral Ralph    . COLONIC POLYPS, HX OF 12/02/2008    Qualifier: Diagnosis of  By: [name] MD, Olen Eaves    . COLITIS, HX OF 08/21/2007    Qualifier: Diagnosis of  By: Jerral Ralph    . BENIGN PROSTATIC HYPERTROPHY, HX OF 08/21/2007    Qualifier: Diagnosis of  By: Jerral Ralph     Past Surgical History  Procedure Laterality Date  . Hemorrhoid surgery  2001    reports that he has never smoked. He does not  have any smokeless tobacco history on file. He reports that he does not drink alcohol or use illicit drugs. family history includes Asthma in his mother; Heart disease in his other. Allergies  Allergen Reactions  . Neosporin [Neomycin-Bacitracin Zn-Polymyx] Rash      Review of Systems  Constitutional: Positive for fatigue.  Eyes: Negative for visual disturbance.  Respiratory: Negative for cough, chest tightness and shortness of breath.   Cardiovascular: Negative for chest pain, palpitations and leg swelling.  Endocrine: Negative for polyuria.  Genitourinary: Negative for frequency.  Neurological: Negative for dizziness, syncope, weakness, light-headedness and headaches.       Objective:   Physical Exam  Constitutional: He is oriented to person, place, and time. He appears well-developed and well-nourished.  HENT:  Mouth/Throat: Oropharynx is clear and moist.  Neck: Neck supple. No thyromegaly present.  Cardiovascular: Normal rate and regular rhythm.   Murmur heard. Pulmonary/Chest: Effort normal and breath sounds normal. No respiratory distress. He has no wheezes. He has no rales.  Musculoskeletal: He exhibits no edema.  Neurological: He is alert and oriented to person, place, and time. No cranial nerve deficit.  Psychiatric: He has a normal mood and affect. His behavior is normal.          Assessment & Plan:  #1 daytime somnolence and fatigue. Suspect related to obstructive apnea. Is compliant with CPAP  but not assessed in years.  Check comprehensive metabolic panel, CBC, TSH. Set up pulmonary referral to reassess his sleep apnea which has not been assessed in several years #2 dry mouth. He does not take any anticholinergic medications. Denies any urine frequency or other suggestion of diabetes. Check labs as above

## 2014-06-28 ENCOUNTER — Encounter: Payer: Self-pay | Admitting: Cardiology

## 2014-06-28 DIAGNOSIS — N400 Enlarged prostate without lower urinary tract symptoms: Secondary | ICD-10-CM

## 2014-06-28 DIAGNOSIS — I350 Nonrheumatic aortic (valve) stenosis: Secondary | ICD-10-CM | POA: Diagnosis not present

## 2014-06-28 DIAGNOSIS — I209 Angina pectoris, unspecified: Secondary | ICD-10-CM | POA: Insufficient documentation

## 2014-06-28 DIAGNOSIS — R0602 Shortness of breath: Secondary | ICD-10-CM | POA: Diagnosis not present

## 2014-06-28 DIAGNOSIS — K219 Gastro-esophageal reflux disease without esophagitis: Secondary | ICD-10-CM

## 2014-06-28 DIAGNOSIS — G4730 Sleep apnea, unspecified: Secondary | ICD-10-CM

## 2014-06-28 DIAGNOSIS — G4733 Obstructive sleep apnea (adult) (pediatric): Secondary | ICD-10-CM | POA: Diagnosis not present

## 2014-06-28 DIAGNOSIS — E785 Hyperlipidemia, unspecified: Secondary | ICD-10-CM

## 2014-06-28 DIAGNOSIS — R0600 Dyspnea, unspecified: Secondary | ICD-10-CM | POA: Diagnosis not present

## 2014-06-28 DIAGNOSIS — I359 Nonrheumatic aortic valve disorder, unspecified: Secondary | ICD-10-CM | POA: Diagnosis not present

## 2014-06-28 NOTE — Progress Notes (Signed)
[name], male   DOB: Jan 11, 1929, 81 y.o.   MRN: [id number]   James, Hardy  Date of visit:  06/28/2014 DOB:  1928-05-11    Age:  81 yrs. Medical record number:  [id number]     Account number:  [id number] Primary Care Provider: Carolann Littler ____________________________ CURRENT DIAGNOSES  1. Angina pectoris, unspecified  2. Nonrheumatic aortic (valve) stenosis  3. Gastro-esophageal reflux disease without esophagitis  4. Hyperlipidemia, unspecified  5. Sleep apnea ____________________________ ALLERGIES  Neosporin, Rash ____________________________ MEDICATIONS  1. omeprazole 20 mg capsule,delayed release, 1 p.o. daily  2. finasteride 5 mg tablet, 1 p.o. daily  3. doxazosin 8 mg tablet, 1 p.o. daily  4. aspirin 81 mg chewable tablet, 1 p.o. daily  5. metoprolol succinate ER 25 mg tablet,extended release 24 hr, 1 p.o. daily ____________________________ CHIEF COMPLAINTS  Followup of Angina pectoris, unspecified  Followup of Nonrheumatic aortic (valve) stenosis ____________________________ HISTORY OF PRESENT ILLNESS Patient seen for cardiac followup. He remains physically active and is able to chop wood and a fairly vigorous exertion involved with the including carrying what as well as splitting it. He does still have some dyspnea when he walks up hills fast but is not limiting. He denies PND, orthopnea, syncope, palpitations, or claudication. His echocardiogram showed continued moderate aortic stenosis done in January. He does have sleep apnea and is having evidently symptoms of daytime sleepiness. His wife was quite concerned about this. Continues with a moderate amount of low back pain as well as BPH-type symptoms. ____________________________ PAST HISTORY  Past Medical Illnesses:  hyperlipidemia, GERD, sleep apnea, BPH, lumbar disc disease;  Cardiovascular Illnesses:  aortic stenosis, CAD;  Surgical Procedures:  hemmoroidectomy;  NYHA Classification:  II;  Canadian Angina  Classification:  Class 2: Angina with moderate exertion;  Cardiology Procedures-Invasive:  no history of prior cardiac procedures;  Cardiology Procedures-Noninvasive:  lexiscan cardiolite November 2014, echocardiogram January 2016;  LVEF of 60% documented via echocardiogram on 04/11/2014,   ____________________________ CARDIO-PULMONARY TEST DATES EKG Date:  06/28/2014;  Nuclear Study Date:  02/17/2013;  Echocardiography Date: 04/11/2014;   ____________________________ FAMILY HISTORY Brother -- Brother dead, Unknown Disease Brother -- Brother dead, Myocardial infarction Father -- Father dead, Chronic obstructive lung disease, Influenza/Pneumonia Mother -- Mother dead, Coronary Artery Disease, Asthma Sister -- Sister alive with problem, Alzheimer's disease Sister -- Sister alive and well Sister -- Sister alive and well ____________________________ SOCIAL HISTORY Alcohol Use:  no alcohol use;  Smoking:  never smoked;  Diet:  regular diet;  Lifestyle:  married;  Exercise:  walking 3 days per week and exercise strength for back;  Occupation:  retired Film/video editor;  Residence:  lives with wife;   ____________________________ REVIEW OF SYSTEMS General:  denies recent weight change, fatique or change in exercise tolerance. Eyes: cataracts Respiratory: mild dyspnea with exertion Cardiovascular:  please review HPI Abdominal: history of GERD and dyspepsia Genitourinary-Male: nocturia  Musculoskeletal:  chronic back pain Neurological:  denies headaches, stroke, or TIA  ____________________________ PHYSICAL EXAMINATION VITAL SIGNS  Blood Pressure:  114/60 Sitting, Right arm, regular cuff  , 110/60 Standing, Right arm and regular cuff   Pulse:  70/min. Weight:  138.00 lbs. Height:  63"BMI: 24  Constitutional:  pleasant white male in no acute distress Skin:  scattered ecchymosis present Head:  normocephalic, normal hair pattern, no masses or tenderness ENT:  ears, nose and throat reveal no gross  abnormalities.  Dentition good. Neck:  supple, without massess. No JVD, thyromegaly or carotid bruits. Carotid  upstroke normal. Chest:  normal symmetry, clear to auscultation Cardiac:  regular rhythm, normal S1 and S2, no S3 or S4, grade 2/6 systolic murmur at aortic area radiating to neck Peripheral Pulses:  the femoral,dorsalis pedis, and posterior tibial pulses are full and equal bilaterally with no bruits auscultated. Extremities & Back:  1+ edema, bilateral venous insufficiency changes present Neurological:  no gross motor or sensory deficits noted, affect appropriate, oriented x3. ____________________________ IMPRESSIONS/PLAN  1. Angina pectoris with moderate to significant exertion that is stable and unchanged 2. Moderate aortic stenosis-continued observation 3. Sleep apnea-due to followup with the pulmonary physician as he is having some daytime somnolence issues.  Recommendations:  Followup in 6 months but he is to contact us if he develops worsening shortness of breath or angina. The symptoms appear stable and see no reason to change recommendations at this time. Call if symptoms worsen. EKG shows IVCD and nonspecific ST and T wave changes.  ____________________________ TODAYS ORDERS  1. 12 Lead EKG: Today  2. Followup in 6 months                       ____________________________ Cardiology Physician:  Kerry Hough MD [facility]

## 2014-08-09 ENCOUNTER — Ambulatory Visit (INDEPENDENT_AMBULATORY_CARE_PROVIDER_SITE_OTHER): Payer: Medicare Other | Admitting: Pulmonary Disease

## 2014-08-09 ENCOUNTER — Encounter: Payer: Self-pay | Admitting: Pulmonary Disease

## 2014-08-09 VITALS — BP 118/62 | HR 53 | Ht 63.0 in | Wt 141.0 lb

## 2014-08-09 DIAGNOSIS — G4733 Obstructive sleep apnea (adult) (pediatric): Secondary | ICD-10-CM

## 2014-08-09 DIAGNOSIS — I209 Angina pectoris, unspecified: Secondary | ICD-10-CM

## 2014-08-09 DIAGNOSIS — Z9989 Dependence on other enabling machines and devices: Principal | ICD-10-CM

## 2014-08-09 NOTE — Patient Instructions (Signed)
Will get CPAP report Will try to arrange for new CPAP machine Follow up in 2 months

## 2014-08-09 NOTE — Progress Notes (Signed)
   Subjective:    [name], male    DOB: 07/25/1928, 81 y.o.   MRN: [id number]  HPI    Review of Systems  Constitutional: Negative for fever and unexpected weight change.  HENT: Positive for postnasal drip and sneezing. Negative for congestion, dental problem, ear pain, mouth sores, nosebleeds, rhinorrhea, sinus pressure, sore throat and trouble swallowing.   Eyes: Negative for redness and itching.  Respiratory: Positive for shortness of breath. Negative for cough, chest tightness and wheezing.   Cardiovascular: Negative for palpitations and leg swelling.  Gastrointestinal: Negative for nausea and vomiting.  Genitourinary: Negative for dysuria.  Musculoskeletal: Negative for joint swelling.  Skin: Negative for rash.  Neurological: Negative for headaches.  Hematological: Bruises/bleeds easily.  Psychiatric/Behavioral: Negative for dysphoric mood. The patient is not nervous/anxious.        Objective:   Physical Exam        Assessment & Plan:

## 2014-08-09 NOTE — Progress Notes (Signed)
Chief Complaint  Patient presents with  . Sleep Consult    Referred by Dr. [name] for difficulty staying awake. Pt has CPAP and uses nightly.     History of Present Illness: James Hardy is a 81 y.o. male for evaluation of sleep problems.  He was diagnosed with sleep apnea about 20 years ago, and has been on CPAP.  He last got a new machine about 10 years ago.  He has full face mask and this fits well.  He gets new mask from [facility] about every 6 months.  He has notice more trouble staying awake.  He will take power naps for 15 to 20 minutes and this helps some.  He does not sleep longer than this.  He does not use his CPAP when he naps during the day.  He has not had any recent changes to his medications.  He goes to sleep at 11 pm.  He falls asleep after 5 minutes.  He wakes up 3 or 4 times to use the bathroom.  He gets out of bed at 7 am.  He feels okay in the morning.  He denies morning headache.  He does not use anything to help him fall sleep or stay awake.  He denies sleep walking, sleep talking, bruxism, or nightmares.  There is no history of restless legs.  He denies sleep hallucinations, sleep paralysis, or cataplexy.  The Epworth score is 18 out of 24.  James Hardy  has a past medical history of HYPERLIPIDEMIA (04/11/2009); CERUMEN IMPACTION (04/11/2009); INTERNAL HEMORRHOIDS (08/28/2006); APHTHOUS ULCERS (12/19/2009); ANGULAR CHEILITIS (04/11/2009); GERD (12/02/2008); DIVERTICULOSIS, COLON (08/28/2006); OSTEOPOROSIS (08/21/2007); SLEEP APNEA (08/21/2007); COLONIC POLYPS, HX OF (12/02/2008); COLITIS, HX OF (08/21/2007); and BENIGN PROSTATIC HYPERTROPHY, HX OF (08/21/2007).  James Hardy  has past surgical history that includes Hemorrhoid surgery (2001).  Prior to Admission medications   Medication Sig Start Date End Date Taking? Authorizing Provider  aspirin 81 MG tablet Take 81 mg by mouth daily.   Yes Historical Provider, MD  doxazosin (CARDURA) 8 MG tablet Take 8 mg by mouth at bedtime.    Yes Historical Provider, MD  finasteride (PROSCAR) 5 MG tablet Take 5 mg by mouth daily.   Yes Historical Provider, MD  metoprolol succinate (TOPROL-XL) 25 MG 24 hr tablet Take 25 mg by mouth daily.  04/29/13  Yes Historical Provider, MD  omeprazole (PRILOSEC) 20 MG capsule TAKE 1 CAPSULE (20 MG TOTAL) BY MOUTH DAILY. 06/21/14  Yes [name], MD  Probiotic Product (PRO-BIOTIC BLEND) CAPS Take by mouth daily.   Yes Historical Provider, MD    Allergies  Allergen Reactions  . Neosporin [Neomycin-Bacitracin Zn-Polymyx] Rash    His family history includes Asthma in his mother; Heart disease in his other.  He  reports that he has never smoked. He does not have any smokeless tobacco history on file. He reports that he does not drink alcohol or use illicit drugs.   Physical Exam: Blood pressure 118/62, pulse 53, height 5\' 3"  (1.6 m), weight 141 lb (63.957 kg), SpO2 98 %. Body mass index is 24.98 kg/(m^2).  General - No distress ENT - No sinus tenderness, no oral exudate, no LAN, no thyromegaly, TM clear, pupils equal/reactive, MP 2, triangular uvula, decreased AP diameter Cardiac - s1s2 regular, no murmur, pulses symmetric Chest - No wheeze/rales/dullness, good air entry, normal respiratory excursion Back - No focal tenderness Abd - Soft, non-tender, no organomegaly, + bowel sounds Ext - No edema Neuro - Normal  strength, cranial nerves intact Skin - No rashes Psych - Normal mood, and behavior  Discussion: He has hx of sleep apnea and reports compliance with CPAP.  He has progressive daytime sleepiness.  His CPAP machine is quite old, and I am concerned it might not be delivering adequate pressure.  Assessment/plan:  Obstructive sleep apnea. Plan: - will get copy of his current download - will arrange for new auto CPAP with range 5 to 15 cm H2O - reviewed proper mask fit  Hypersomnia. Plan: - defer further assessment until certain his sleep apnea is adequately  controlled   Chesley Mires, M.D. Pager [phone]

## 2014-09-09 ENCOUNTER — Ambulatory Visit (INDEPENDENT_AMBULATORY_CARE_PROVIDER_SITE_OTHER): Payer: Medicare Other | Admitting: Family Medicine

## 2014-09-09 ENCOUNTER — Encounter: Payer: Self-pay | Admitting: Family Medicine

## 2014-09-09 VITALS — BP 130/68 | HR 62 | Temp 97.7°F | Wt 138.0 lb

## 2014-09-09 DIAGNOSIS — I209 Angina pectoris, unspecified: Secondary | ICD-10-CM | POA: Diagnosis not present

## 2014-09-09 DIAGNOSIS — R05 Cough: Secondary | ICD-10-CM

## 2014-09-09 DIAGNOSIS — Z0000 Encounter for general adult medical examination without abnormal findings: Secondary | ICD-10-CM | POA: Diagnosis not present

## 2014-09-09 DIAGNOSIS — R059 Cough, unspecified: Secondary | ICD-10-CM

## 2014-09-09 MED ORDER — LEVOFLOXACIN 500 MG PO TABS: 500.0000 mg | ORAL_TABLET | Freq: Every day | ORAL | Status: DC

## 2014-09-09 NOTE — Progress Notes (Signed)
Pre visit review using our clinic review tool, if applicable. No additional management support is needed unless otherwise documented below in the visit note. 

## 2014-09-09 NOTE — Progress Notes (Signed)
   Subjective:    [name], male    DOB: 13-Dec-1928, 81 y.o.   MRN: [id number]  HPI Patient seen today with one-week history of progressive symptoms. He's had nasal congestion which started about a week ago. Since has had progressive cough which is mostly nonproductive. Has mild headaches and body aches. Earlier today had one episode of nausea and vomiting. Mild loose stool this morning. No dyspnea. No chest pain. No dysuria.  He's been dealing with some fatigue issues recently.  New CPAP equipment and this has not helped his fatigue so far.  Past Medical History  Diagnosis Date  . HYPERLIPIDEMIA 04/11/2009    Qualifier: Diagnosis of  By: Joyce Gross    . CERUMEN IMPACTION 04/11/2009    Qualifier: Diagnosis of  By: Joyce Gross    . INTERNAL HEMORRHOIDS 08/28/2006    Qualifier: Diagnosis of  By: Jerral Ralph    . APHTHOUS ULCERS 12/19/2009    Qualifier: Diagnosis of  By: [name] MD, Kurt Azimi    . ANGULAR CHEILITIS 04/11/2009    Qualifier: Diagnosis of  By: Joyce Gross    . GERD 12/02/2008    Qualifier: Diagnosis of  By: [name] MD, Sabin Gibeault    . DIVERTICULOSIS, COLON 08/28/2006    Qualifier: Diagnosis of  By: Jerral Ralph    . OSTEOPOROSIS 08/21/2007    Qualifier: Diagnosis of  By: Jerral Ralph    . SLEEP APNEA 08/21/2007    Qualifier: Diagnosis of  By: Jerral Ralph    . COLONIC POLYPS, HX OF 12/02/2008    Qualifier: Diagnosis of  By: [name] MD, Rilya Longo    . COLITIS, HX OF 08/21/2007    Qualifier: Diagnosis of  By: Jerral Ralph    . BENIGN PROSTATIC HYPERTROPHY, HX OF 08/21/2007    Qualifier: Diagnosis of  By: Jerral Ralph     Past Surgical History  Procedure Laterality Date  . Hemorrhoid surgery  2001    reports that he has never smoked. He does not have any smokeless tobacco history on file. He reports that he does not drink alcohol or use illicit drugs. family history includes Asthma in his mother; Heart  disease in his other. Allergies  Allergen Reactions  . Neosporin [Neomycin-Bacitracin Zn-Polymyx] Rash      Review of Systems  Constitutional: Positive for fatigue. Negative for fever and chills.  HENT: Positive for congestion.   Respiratory: Positive for cough.   Genitourinary: Negative for dysuria.  Neurological: Positive for headaches.       Objective:   Physical Exam  Constitutional: He appears well-developed and well-nourished.  HENT:  Right Ear: External ear normal.  Left Ear: External ear normal.  Mouth/Throat: Oropharynx is clear and moist.  Neck: Neck supple.  Cardiovascular: Normal rate and regular rhythm.   Pulmonary/Chest: Effort normal.  He has some faint crackles left base right is clear. Normal respiratory rate. No retractions. No wheezes.  Musculoskeletal: He exhibits no edema.  Lymphadenopathy:    He has no cervical adenopathy.  Neurological: He is alert.  Skin: No rash noted.          Assessment & Plan:  Cough. Concern for possible early left lower lobe pneumonia clinically. Pulse oxygen 97%. No respiratory distress. Appears well-hydrated. Blood pressure and other vitals are stable. Levaquin 500 milligrams once daily. Stay well-hydrated. Reassess early next week on Monday and sooner as needed

## 2014-09-09 NOTE — Patient Instructions (Signed)
Stay well hydrated James Hardy diet  Start the antibiotic Follow up promptly for any shortness of breath, recurrent vomiting, or confusion.

## 2014-09-12 ENCOUNTER — Encounter: Payer: Self-pay | Admitting: Family Medicine

## 2014-09-12 ENCOUNTER — Ambulatory Visit (INDEPENDENT_AMBULATORY_CARE_PROVIDER_SITE_OTHER): Payer: Medicare Other | Admitting: Family Medicine

## 2014-09-12 VITALS — BP 120/78 | HR 60 | Temp 97.7°F | Wt 140.0 lb

## 2014-09-12 DIAGNOSIS — I209 Angina pectoris, unspecified: Secondary | ICD-10-CM | POA: Diagnosis not present

## 2014-09-12 DIAGNOSIS — R05 Cough: Secondary | ICD-10-CM | POA: Diagnosis not present

## 2014-09-12 DIAGNOSIS — R059 Cough, unspecified: Secondary | ICD-10-CM

## 2014-09-12 NOTE — Patient Instructions (Signed)
Follow up for any fever or increased shortness of breath. 

## 2014-09-12 NOTE — Progress Notes (Signed)
   Subjective:    [name], male    DOB: Feb 22, 1929, 81 y.o.   MRN: [id number]  HPI Here for follow-up recent respiratory illness. He presented here Friday with cough and did have a few crackles in his left base. We were concerned about possible early community acquired pneumonia. We started Levaquin 500 mg once daily. He had one episode of vomiting that night but none since then. Overall, he is feeling much better at this time. Still has some cough but has not had any fever. No dyspnea. Improving appetite. We did not obtain chest x-ray Friday as he was here late in the day close to 5 PM. He did not appear toxic in anyway did not have any predictors or bad outcome such as dehydration or increased respiratory or hypoxemia  Past Medical History  Diagnosis Date  . HYPERLIPIDEMIA 04/11/2009    Qualifier: Diagnosis of  By: Joyce Gross    . CERUMEN IMPACTION 04/11/2009    Qualifier: Diagnosis of  By: Joyce Gross    . INTERNAL HEMORRHOIDS 08/28/2006    Qualifier: Diagnosis of  By: Jerral Ralph    . APHTHOUS ULCERS 12/19/2009    Qualifier: Diagnosis of  By: [name] MD, Konnor Jorden    . ANGULAR CHEILITIS 04/11/2009    Qualifier: Diagnosis of  By: Joyce Gross    . GERD 12/02/2008    Qualifier: Diagnosis of  By: [name] MD, Suzanna Zahn    . DIVERTICULOSIS, COLON 08/28/2006    Qualifier: Diagnosis of  By: Jerral Ralph    . OSTEOPOROSIS 08/21/2007    Qualifier: Diagnosis of  By: Jerral Ralph    . SLEEP APNEA 08/21/2007    Qualifier: Diagnosis of  By: Jerral Ralph    . COLONIC POLYPS, HX OF 12/02/2008    Qualifier: Diagnosis of  By: [name] MD, Bodhi Stenglein    . COLITIS, HX OF 08/21/2007    Qualifier: Diagnosis of  By: Jerral Ralph    . BENIGN PROSTATIC HYPERTROPHY, HX OF 08/21/2007    Qualifier: Diagnosis of  By: Jerral Ralph     Past Surgical History  Procedure Laterality Date  . Hemorrhoid surgery  2001    reports that he has never  smoked. He does not have any smokeless tobacco history on file. He reports that he does not drink alcohol or use illicit drugs. family history includes Asthma in his mother; Heart disease in his other. Allergies  Allergen Reactions  . Neosporin [Neomycin-Bacitracin Zn-Polymyx] Rash      Review of Systems  Constitutional: Negative for fever, chills and appetite change.  Respiratory: Positive for cough. Negative for shortness of breath and wheezing.   Cardiovascular: Negative for chest pain.  Neurological: Negative for dizziness.       Objective:   Physical Exam  Constitutional: He appears well-developed and well-nourished.  HENT:  Mouth/Throat: Oropharynx is clear and moist.  Neck: Neck supple. No thyromegaly present.  Cardiovascular: Normal rate.   Pulmonary/Chest: Effort normal and breath sounds normal. No respiratory distress. He has no wheezes. He has no rales.  Musculoskeletal: He exhibits no edema.  Lymphadenopathy:    He has no cervical adenopathy.          Assessment & Plan:  Cough. Overall improved. He does not have any fever or increased shortness of breath. Finish out Levaquin. Follow-up promptly for any change of symptoms or recurrent fever

## 2014-09-12 NOTE — Progress Notes (Signed)
Pre visit review using our clinic review tool, if applicable. No additional management support is needed unless otherwise documented below in the visit note. 

## 2014-10-16 ENCOUNTER — Telehealth: Payer: Self-pay | Admitting: Pulmonary Disease

## 2014-10-16 DIAGNOSIS — G4733 Obstructive sleep apnea (adult) (pediatric): Secondary | ICD-10-CM

## 2014-10-16 NOTE — Telephone Encounter (Signed)
Auto CPAP 08/23/14 to 09/21/14 >> used on 30 of 30 nights with average 5 hrs and 41 min.  Average AHI is 25 with median CPAP 8 cm H2O and 95 th percentile CPAP 12 cm H20.  Significant air leak.   Will have my nurse inform pt that CPAP report shows significant air leak from mask.  This could be contributing to inadequate control of his sleep apnea, and causing daytime sleepiness.    Please send order to have his DME adjust his mask fit.

## 2014-10-18 NOTE — Telephone Encounter (Signed)
ATC, WCB  Unable to leave voicemail

## 2014-10-26 NOTE — Telephone Encounter (Signed)
LMTCB x 1 

## 2014-10-26 NOTE — Telephone Encounter (Signed)
Spoke with pt and informed him of VS recs. Sending order to [facility] to adjust mask to fit. Nothing further needed.

## 2014-10-26 NOTE — Telephone Encounter (Signed)
[phone] returning call

## 2014-11-03 ENCOUNTER — Ambulatory Visit (INDEPENDENT_AMBULATORY_CARE_PROVIDER_SITE_OTHER): Payer: Medicare Other | Admitting: Otolaryngology

## 2014-11-03 DIAGNOSIS — K1230 Oral mucositis (ulcerative), unspecified: Secondary | ICD-10-CM | POA: Diagnosis not present

## 2014-11-03 DIAGNOSIS — H6123 Impacted cerumen, bilateral: Secondary | ICD-10-CM | POA: Diagnosis not present

## 2014-12-06 ENCOUNTER — Encounter: Payer: Self-pay | Admitting: Pulmonary Disease

## 2014-12-06 ENCOUNTER — Ambulatory Visit (INDEPENDENT_AMBULATORY_CARE_PROVIDER_SITE_OTHER): Payer: Medicare Other | Admitting: Pulmonary Disease

## 2014-12-06 VITALS — BP 126/70 | HR 62 | Ht 62.0 in | Wt 144.0 lb

## 2014-12-06 DIAGNOSIS — G4733 Obstructive sleep apnea (adult) (pediatric): Secondary | ICD-10-CM

## 2014-12-06 DIAGNOSIS — I209 Angina pectoris, unspecified: Secondary | ICD-10-CM | POA: Diagnosis not present

## 2014-12-06 DIAGNOSIS — Z23 Encounter for immunization: Secondary | ICD-10-CM

## 2014-12-06 DIAGNOSIS — Z9989 Dependence on other enabling machines and devices: Principal | ICD-10-CM

## 2014-12-06 NOTE — Progress Notes (Signed)
Chief Complaint  Patient presents with  . Follow-up    Pt states wearing CPAP every night for at least 6 hours. Pt states no problems wiith mask or machine. Pt states receiving errors on machine stating air leak.     History of Present Illness: James Hardy is a 81 y.o. male with OSA.  He uses his CPAP for about 5 to 6 hours per night.  He likes how quiet his new machine is.  He is not aware of having mask leak, but has notice his machine is reading significant leak.  He has full face mask.  He has to make sure it is tight to get a good fit.  His wife is concerned that he can fall asleep during the day >> at church, watching TV, while eating.  He will nap for 1 to 2 hours at times, and does not use his CPAP when napping.   TESTS: Auto CPAP 11/05/14 to 12/04/14 >> used on 30 of 230nights with average 5 hrs and 52 min.  Average AHI is 27.7 with median CPAP 8 cm H2O and 95 th percentile CPAP 12.4 cm H20.   PMhx >> HLD, GERD, Diverticulosis, BPH  Past surgical hx, Medications, Allergies, Family hx, Social hx all reviewed.   Physical Exam: BP 126/70 mmHg  Pulse 62  Ht 5\' 2"  (1.575 m)  Wt 144 lb (65.318 kg)  BMI 26.33 kg/m2  SpO2 99%  General - No distress ENT - No sinus tenderness, no oral exudate, no LAN Cardiac - s1s2 regular, no murmur Chest - No wheeze/rales/dullness Back - No focal tenderness Abd - Soft, non-tender Ext - No edema Neuro - Normal strength Skin - No rashes Psych - normal mood, and behavior   Assessment/Plan:  Obstructive sleep apnea. His download shows his AHI still elevated and has significant air leak. Plan: - will have him get mask fit check at sleep lab - continue auto CPAP   Chesley Mires, MD [facility] Pager:  [phone]

## 2014-12-06 NOTE — Patient Instructions (Signed)
Flu shot today  Will arrange for appointment at sleep lab to get CPAP mask refitted   Follow up in 6 months

## 2014-12-13 ENCOUNTER — Other Ambulatory Visit: Payer: Self-pay | Admitting: Family Medicine

## 2014-12-13 ENCOUNTER — Ambulatory Visit (HOSPITAL_BASED_OUTPATIENT_CLINIC_OR_DEPARTMENT_OTHER): Payer: Medicare Other | Attending: Pulmonary Disease | Admitting: Radiology

## 2014-12-13 ENCOUNTER — Other Ambulatory Visit (HOSPITAL_BASED_OUTPATIENT_CLINIC_OR_DEPARTMENT_OTHER): Payer: Medicare Other

## 2014-12-13 DIAGNOSIS — Z9989 Dependence on other enabling machines and devices: Principal | ICD-10-CM

## 2014-12-13 DIAGNOSIS — G4733 Obstructive sleep apnea (adult) (pediatric): Secondary | ICD-10-CM

## 2014-12-15 ENCOUNTER — Ambulatory Visit (INDEPENDENT_AMBULATORY_CARE_PROVIDER_SITE_OTHER): Payer: Medicare Other | Admitting: Otolaryngology

## 2014-12-20 ENCOUNTER — Encounter: Payer: Self-pay | Admitting: Cardiology

## 2014-12-20 DIAGNOSIS — E785 Hyperlipidemia, unspecified: Secondary | ICD-10-CM | POA: Diagnosis not present

## 2014-12-20 DIAGNOSIS — G4733 Obstructive sleep apnea (adult) (pediatric): Secondary | ICD-10-CM | POA: Diagnosis not present

## 2014-12-20 DIAGNOSIS — I209 Angina pectoris, unspecified: Secondary | ICD-10-CM | POA: Diagnosis not present

## 2014-12-20 DIAGNOSIS — I350 Nonrheumatic aortic (valve) stenosis: Secondary | ICD-10-CM | POA: Diagnosis not present

## 2014-12-20 DIAGNOSIS — K219 Gastro-esophageal reflux disease without esophagitis: Secondary | ICD-10-CM | POA: Diagnosis not present

## 2014-12-22 ENCOUNTER — Encounter: Payer: Self-pay | Admitting: Family Medicine

## 2014-12-22 ENCOUNTER — Ambulatory Visit (INDEPENDENT_AMBULATORY_CARE_PROVIDER_SITE_OTHER): Payer: Medicare Other | Admitting: Family Medicine

## 2014-12-22 VITALS — BP 114/70 | HR 69 | Temp 98.0°F | Wt 142.3 lb

## 2014-12-22 DIAGNOSIS — I209 Angina pectoris, unspecified: Secondary | ICD-10-CM

## 2014-12-22 DIAGNOSIS — J209 Acute bronchitis, unspecified: Secondary | ICD-10-CM | POA: Diagnosis not present

## 2014-12-22 MED ORDER — CEFUROXIME AXETIL 250 MG PO TABS: 250.0000 mg | ORAL_TABLET | Freq: Two times a day (BID) | ORAL | Status: DC

## 2014-12-22 NOTE — Progress Notes (Signed)
   Subjective:    [name], male    DOB: 11-12-1928, 81 y.o.   MRN: [id number]  HPI Acute visit. 81 year old nonsmoker seen with one-week history of occasionally productive cough. Increase hoarseness. Denies any dyspnea. No fever. No appetite or weight changes. Had similar bronchial illness back in the spring but this eventually clear. No wheezing. No sick contacts. Denies sore throat.  Past Medical History  Diagnosis Date  . HYPERLIPIDEMIA 04/11/2009    Qualifier: Diagnosis of  By: Joyce Gross    . CERUMEN IMPACTION 04/11/2009    Qualifier: Diagnosis of  By: Joyce Gross    . INTERNAL HEMORRHOIDS 08/28/2006    Qualifier: Diagnosis of  By: Jerral Ralph    . APHTHOUS ULCERS 12/19/2009    Qualifier: Diagnosis of  By: [name] MD, Bruce    . ANGULAR CHEILITIS 04/11/2009    Qualifier: Diagnosis of  By: Joyce Gross    . GERD 12/02/2008    Qualifier: Diagnosis of  By: [name] MD, Bruce    . DIVERTICULOSIS, COLON 08/28/2006    Qualifier: Diagnosis of  By: Jerral Ralph    . OSTEOPOROSIS 08/21/2007    Qualifier: Diagnosis of  By: Jerral Ralph    . SLEEP APNEA 08/21/2007    Qualifier: Diagnosis of  By: Jerral Ralph    . COLONIC POLYPS, HX OF 12/02/2008    Qualifier: Diagnosis of  By: [name] MD, Bruce    . COLITIS, HX OF 08/21/2007    Qualifier: Diagnosis of  By: Jerral Ralph    . BENIGN PROSTATIC HYPERTROPHY, HX OF 08/21/2007    Qualifier: Diagnosis of  By: Jerral Ralph     Past Surgical History  Procedure Laterality Date  . Hemorrhoid surgery  2001    reports that he has never smoked. He does not have any smokeless tobacco history on file. He reports that he does not drink alcohol or use illicit drugs. family history includes Asthma in his mother; Heart disease in his other. Allergies  Allergen Reactions  . Neosporin [Neomycin-Bacitracin Zn-Polymyx] Rash      Review of Systems  Constitutional: Negative for  fever and chills.  HENT: Positive for congestion. Negative for sore throat.   Respiratory: Positive for cough. Negative for shortness of breath and wheezing.        Objective:   Physical Exam  Constitutional: He appears well-developed and well-nourished.  HENT:  Right Ear: External ear normal.  Left Ear: External ear normal.  Mouth/Throat: Oropharynx is clear and moist.  Neck: Neck supple.  Cardiovascular: Normal rate and regular rhythm.   Pulmonary/Chest: Effort normal and breath sounds normal. No respiratory distress. He has no wheezes. He has no rales.  Lymphadenopathy:    He has no cervical adenopathy.          Assessment & Plan:  Cough. Suspect acute viral bronchitis. Nonfocal exam. Pulse oximetry 97%. We've recommended Mucinex, plenty fluids, observation for now. No antibiotics at this point but if he develops fever or worsening symptoms start Ceftin 250 mg twice daily for 10 days

## 2014-12-22 NOTE — Patient Instructions (Signed)

## 2014-12-22 NOTE — Progress Notes (Signed)
Pre visit review using our clinic review tool, if applicable. No additional management support is needed unless otherwise documented below in the visit note. 

## 2015-01-19 NOTE — Progress Notes (Signed)
[name], male   DOB: 1928/06/02, 81 y.o.   MRN: [id number]   James Hardy, James Hardy  Date of visit:  12/20/2014 DOB:  [birth year]    Age:  81 yrs. Medical record number:  [id number]     Account number:  [id number] Primary Care Provider: Carolann Littler ____________________________ CURRENT DIAGNOSES  1. Angina pectoris, unspecified  2. Nonrheumatic aortic (valve) stenosis  3. Gastro-esophageal reflux disease without esophagitis  4. Hyperlipidemia, unspecified  5. Sleep apnea ____________________________ ALLERGIES  Neosporin, Rash ____________________________ MEDICATIONS  1. omeprazole 20 mg capsule,delayed release, 1 p.o. daily  2. finasteride 5 mg tablet, 1 p.o. daily  3. doxazosin 8 mg tablet, 1 p.o. daily  4. aspirin 81 mg chewable tablet, 1 p.o. daily  5. metoprolol succinate ER 25 mg tablet,extended release 24 hr, 1 p.o. daily ____________________________ CHIEF COMPLAINTS  Followup of Nonrheumatic aortic (valve) stenosis ____________________________ HISTORY OF PRESENT ILLNESS Patient seen for cardiac followup. He continues to be able to do most of his work around the house and still cuts wood. He still has mild angina when he walks up a slight hill at home but is relatively asymptomatic at 81. He has moderately severe aortic stenosis. He denies angina and has no PND, orthopnea, syncope, palpitations, or claudication. He remains physically active. Wife continues to note some daytime sleepiness. ____________________________ PAST HISTORY  Past Medical Illnesses:  hyperlipidemia, GERD, sleep apnea, BPH, lumbar disc disease;  Cardiovascular Illnesses:  aortic stenosis, CAD;  Surgical Procedures:  hemmoroidectomy;  NYHA Classification:  II;  Canadian Angina Classification:  Class 2: Angina with moderate exertion;  Cardiology Procedures-Invasive:  no history of prior cardiac procedures;  Cardiology Procedures-Noninvasive:  lexiscan cardiolite November 2014, echocardiogram January 2016;   LVEF of 60% documented via echocardiogram on 04/11/2014,   ____________________________ CARDIO-PULMONARY TEST DATES EKG Date:  06/28/2014;  Nuclear Study Date:  02/17/2013;  Echocardiography Date: 04/11/2014;   ____________________________ FAMILY HISTORY Brother -- Brother dead, Unknown Disease Brother -- Brother dead, Myocardial infarction Father -- Father dead, Chronic obstructive lung disease, Influenza/Pneumonia Mother -- Mother dead, Coronary Artery Disease, Asthma Sister -- Sister alive with problem, Alzheimer's disease Sister -- Sister alive and well Sister -- Sister alive and well ____________________________ SOCIAL HISTORY Alcohol Use:  no alcohol use;  Smoking:  never smoked;  Diet:  regular diet;  Lifestyle:  married;  Exercise:  walking 3 days per week and exercise strength for back;  Occupation:  retired Film/video editor;  Residence:  lives with wife;   ____________________________ REVIEW OF SYSTEMS General:  denies recent weight change, fatique or change in exercise tolerance. Eyes: cataracts Respiratory: mild dyspnea with exertion Cardiovascular:  please review HPI Abdominal: history of GERD and dyspepsia Genitourinary-Male: nocturia  Musculoskeletal:  chronic back pain Neurological:  denies headaches, stroke, or TIA  ____________________________ PHYSICAL EXAMINATION VITAL SIGNS  Blood Pressure:  126/68 Sitting, Right arm, regular cuff  , 108/60 Standing, Right arm and regular cuff   Pulse:  60/min. Weight:  140.00 lbs. Height:  63"BMI: 25  Constitutional:  pleasant white male in no acute distress Skin:  scattered ecchymosis present Head:  normocephalic, normal hair pattern, no masses or tenderness ENT:  ears, nose and throat reveal no gross abnormalities.  Dentition good. Neck:  supple, without massess. No JVD, thyromegaly or carotid bruits. Carotid upstroke normal. Chest:  normal symmetry, clear to auscultation Cardiac:  regular rhythm, normal S1 and S2, no S3 or S4, grade  2/6 systolic murmur at aortic area radiating to neck Peripheral Pulses:  the femoral,dorsalis pedis, and posterior tibial pulses are full and equal bilaterally with no bruits auscultated. Extremities & Back:  1+ edema, bilateral venous insufficiency changes present Neurological:  no gross motor or sensory deficits noted, affect appropriate, oriented x3. ___________________________ IMPRESSIONS/PLAN  1. Moderate aortic stenosis 2. Angina which is stable 3. Hyperlipidemia 4. Sleep apnea  Recommendations:  Followup in 4 months with a repeat echocardiogram to assess aortic stenosis. His symptoms sounded fairly stable to me at this time and I don't think aortic stenosis is severe enough for intervention yet. Reported to call if worsening symptoms. ____________________________ TODAYS ORDERS  1. Return Visit: 4 months  2. 12 Lead EKG: 4 months  3. 2D, color flow, doppler: 4 months  4. Lipid Panel: Today                       ____________________________ Cardiology Physician:  Kerry Hough MD [facility]

## 2015-02-01 ENCOUNTER — Telehealth: Payer: Self-pay | Admitting: Pulmonary Disease

## 2015-02-01 DIAGNOSIS — Z9989 Dependence on other enabling machines and devices: Principal | ICD-10-CM

## 2015-02-01 DIAGNOSIS — G4733 Obstructive sleep apnea (adult) (pediatric): Secondary | ICD-10-CM

## 2015-02-01 NOTE — Telephone Encounter (Signed)
Patient requesting new hose for CPAP machine.   Patient says that he uses DME: [facility] in [place] entered  Patient notified Nothing further needed. Closing encounter

## 2015-02-01 NOTE — Telephone Encounter (Signed)
Pt give wrong DME should be [facility].James Hardy

## 2015-02-01 NOTE — Telephone Encounter (Signed)
Pt returned call

## 2015-02-01 NOTE — Telephone Encounter (Signed)
Left message to call back  

## 2015-02-02 DIAGNOSIS — N401 Enlarged prostate with lower urinary tract symptoms: Secondary | ICD-10-CM | POA: Diagnosis not present

## 2015-02-02 DIAGNOSIS — R350 Frequency of micturition: Secondary | ICD-10-CM | POA: Diagnosis not present

## 2015-02-09 DIAGNOSIS — D225 Melanocytic nevi of trunk: Secondary | ICD-10-CM | POA: Diagnosis not present

## 2015-02-09 DIAGNOSIS — X32XXXD Exposure to sunlight, subsequent encounter: Secondary | ICD-10-CM | POA: Diagnosis not present

## 2015-02-09 DIAGNOSIS — L570 Actinic keratosis: Secondary | ICD-10-CM | POA: Diagnosis not present

## 2015-02-09 DIAGNOSIS — D485 Neoplasm of uncertain behavior of skin: Secondary | ICD-10-CM | POA: Diagnosis not present

## 2015-04-11 ENCOUNTER — Ambulatory Visit (INDEPENDENT_AMBULATORY_CARE_PROVIDER_SITE_OTHER): Payer: Medicare Other | Admitting: Family Medicine

## 2015-04-11 ENCOUNTER — Encounter: Payer: Self-pay | Admitting: Family Medicine

## 2015-04-11 VITALS — BP 114/64 | HR 61 | Temp 97.5°F | Ht 62.0 in | Wt 139.1 lb

## 2015-04-11 DIAGNOSIS — Z23 Encounter for immunization: Secondary | ICD-10-CM | POA: Diagnosis not present

## 2015-04-11 DIAGNOSIS — E785 Hyperlipidemia, unspecified: Secondary | ICD-10-CM | POA: Diagnosis not present

## 2015-04-11 DIAGNOSIS — K219 Gastro-esophageal reflux disease without esophagitis: Secondary | ICD-10-CM

## 2015-04-11 DIAGNOSIS — I350 Nonrheumatic aortic (valve) stenosis: Secondary | ICD-10-CM | POA: Diagnosis not present

## 2015-04-11 DIAGNOSIS — S20311A Abrasion of right front wall of thorax, initial encounter: Secondary | ICD-10-CM | POA: Diagnosis not present

## 2015-04-11 DIAGNOSIS — N400 Enlarged prostate without lower urinary tract symptoms: Secondary | ICD-10-CM | POA: Diagnosis not present

## 2015-04-11 DIAGNOSIS — Z0000 Encounter for general adult medical examination without abnormal findings: Secondary | ICD-10-CM | POA: Diagnosis not present

## 2015-04-11 NOTE — Progress Notes (Signed)
Subjective:    [name], male    DOB: 09-06-1928, 82 y.o.   MRN: [id number]  HPI Patient here for Medicare wellness exam and medical follow-up.  No history of shingles vaccine. No documented Prevnar 13. Last tetanus over 10 years ago. He's had previous Pneumovax. Flu vaccine already given.  Chronic problems include aortic stenosis, BPH, GERD, hyperlipidemia, osteoporosis, obstructive sleep apnea. He recently got new equipment regarding his sleep apnea and still has excessive daytime sleepiness and fatigue. BPH followed by urology. Currently takes Flomax, finasteride, and Cardura and is still getting up about every hour. Has follow-up scheduled with urology in about one month. Very disrupted sleep schedule  He relates some slow mild progression of dyspnea and fatigue with activity. No recent chest pains. Aortic stenosis followed by cardiology. No recent syncope  Past Medical History  Diagnosis Date  . HYPERLIPIDEMIA 04/11/2009    Qualifier: Diagnosis of  By: Joyce Gross    . CERUMEN IMPACTION 04/11/2009    Qualifier: Diagnosis of  By: Joyce Gross    . INTERNAL HEMORRHOIDS 08/28/2006    Qualifier: Diagnosis of  By: Jerral Ralph    . APHTHOUS ULCERS 12/19/2009    Qualifier: Diagnosis of  By: [name] MD, Liyat Faulkenberry    . ANGULAR CHEILITIS 04/11/2009    Qualifier: Diagnosis of  By: Joyce Gross    . GERD 12/02/2008    Qualifier: Diagnosis of  By: [name] MD, Reinaldo Helt    . DIVERTICULOSIS, COLON 08/28/2006    Qualifier: Diagnosis of  By: Jerral Ralph    . OSTEOPOROSIS 08/21/2007    Qualifier: Diagnosis of  By: Jerral Ralph    . SLEEP APNEA 08/21/2007    Qualifier: Diagnosis of  By: Jerral Ralph    . COLONIC POLYPS, HX OF 12/02/2008    Qualifier: Diagnosis of  By: [name] MD, Porschia Willbanks    . COLITIS, HX OF 08/21/2007    Qualifier: Diagnosis of  By: Jerral Ralph    . BENIGN PROSTATIC HYPERTROPHY, HX OF 08/21/2007    Qualifier: Diagnosis  of  By: Jerral Ralph     Past Surgical History  Procedure Laterality Date  . Hemorrhoid surgery  2001    reports that he has never smoked. He does not have any smokeless tobacco history on file. He reports that he does not drink alcohol or use illicit drugs. family history includes Asthma in his mother; Heart disease in his other. Allergies  Allergen Reactions  . Neosporin [Neomycin-Bacitracin Zn-Polymyx] Rash   1.  Risk factors based on Past Medical , Social, and Family history reviewed and as indicated above with no changes 2.  Limitations in physical activities None.  No recent falls. 3.  Depression/mood No active depression or anxiety issues 4.  Hearing mild deficits 5.  ADLs independent in all. 6.  Cognitive function (orientation to time and place, language, writing, speech,memory) no short or long term memory issues.  Language and judgement intact. 7.  Home Safety no issues 8.  Height, weight, and visual acuity.all stable. 9.  Counseling discussed patient questions regarding abdominal aortic aneurysm screening. Given his age and especially his history of aortic stenosis not sure he a very good candidate for aggressive intervention even if he had aneurysm. He is asymptomatic. We will discuss with cardiology.  Pt is not interested in screening at this time. 10. Recommendation of preventive services. dT and Prevnar 13. 11. Labs based on risk factors- none 12. Care Plan as above 13. Other Providers Dr  Grapey-Urology   Dr Tilley-Cardiology Dr Sood-Pulmonary/sleep medicine. 14. Written schedule of screening/prevention services given to patient.    Review of Systems  Constitutional: Negative for fever, activity change, appetite change and fatigue.  HENT: Negative for congestion, ear pain and trouble swallowing.   Eyes: Negative for pain and visual disturbance.  Respiratory: Positive for shortness of breath. Negative for cough, chest tightness and wheezing.   Cardiovascular:  Negative for chest pain and palpitations.  Gastrointestinal: Negative for nausea, vomiting, abdominal pain, diarrhea, constipation, blood in stool, abdominal distention and rectal pain.  Genitourinary: Positive for decreased urine volume. Negative for hematuria, flank pain and testicular pain.  Musculoskeletal: Negative for joint swelling and arthralgias.  Skin: Negative for rash.  Neurological: Negative for dizziness, syncope and headaches.  Hematological: Negative for adenopathy.  Psychiatric/Behavioral: Negative for confusion and dysphoric mood.       Objective:   Physical Exam  Constitutional: He is oriented to person, place, and time. He appears well-developed and well-nourished. No distress.  HENT:  Right Ear: External ear normal.  Left Ear: External ear normal.  Mouth/Throat: Oropharynx is clear and moist.  Neck: Neck supple. No thyromegaly present.  Cardiovascular: Normal rate and regular rhythm.   Murmur heard. Pulmonary/Chest: Effort normal and breath sounds normal. No respiratory distress. He has no wheezes. He has no rales.  Abdominal: Soft. Bowel sounds are normal. He exhibits no distension and no mass. There is no tenderness. There is no rebound and no guarding.  Musculoskeletal: He exhibits no edema.  Neurological: He is alert and oriented to person, place, and time.  Skin:  Right upper chest wall small 1 cm abraded area -?traumatized seborrheic keratosis.  No active bleeding.  Psychiatric: He has a normal mood and affect. His behavior is normal.          Assessment & Plan:  #1 Medicare wellness exam. Prevnar 13 given. Continue yearly flu vaccine. He had questions regarding shingles vaccine and he will check on coverage.  Would not recommend further colonoscopy screening at his age.  #2 abrasion right chest wall. Tetanus booster given. This appears to be most likely seborrheic keratosis that was accidentally scraped off. Follow-up for signs of secondary  infection  #3 aortic stenosis. Patient gradually more symptomatic with dyspnea. Follow-up with cardiology as scheduled  #4 daytime somnolence. Suspect largely related to disrupted sleep which is mostly related to his BPH  #5 BPH. He's had progressive symptoms unfortunately even on maximum medication. Has follow up scheduled with Urology.

## 2015-04-11 NOTE — Progress Notes (Signed)
Pre visit review using our clinic review tool, if applicable. No additional management support is needed unless otherwise documented below in the visit note. 

## 2015-04-11 NOTE — Patient Instructions (Signed)
Continue with yearly flu vaccine Check on coverage for shingles vaccine.  Health Maintenance  Topic Date Due  . ZOSTAVAX  09/28/1990  . PNA vac Low Risk Adult (1 of 2 - PCV13) 09/28/1995  . TETANUS/TDAP  03/25/2013  . INFLUENZA VACCINE  10/24/2015

## 2015-04-14 DIAGNOSIS — N138 Other obstructive and reflux uropathy: Secondary | ICD-10-CM | POA: Diagnosis not present

## 2015-04-14 DIAGNOSIS — R351 Nocturia: Secondary | ICD-10-CM | POA: Diagnosis not present

## 2015-04-14 DIAGNOSIS — N401 Enlarged prostate with lower urinary tract symptoms: Secondary | ICD-10-CM | POA: Diagnosis not present

## 2015-04-14 DIAGNOSIS — R350 Frequency of micturition: Secondary | ICD-10-CM | POA: Diagnosis not present

## 2015-04-14 DIAGNOSIS — Z0000 Encounter for general adult medical examination without abnormal findings: Secondary | ICD-10-CM | POA: Diagnosis not present

## 2015-04-24 DIAGNOSIS — I209 Angina pectoris, unspecified: Secondary | ICD-10-CM | POA: Diagnosis not present

## 2015-04-24 DIAGNOSIS — G4733 Obstructive sleep apnea (adult) (pediatric): Secondary | ICD-10-CM | POA: Diagnosis not present

## 2015-04-24 DIAGNOSIS — K219 Gastro-esophageal reflux disease without esophagitis: Secondary | ICD-10-CM | POA: Diagnosis not present

## 2015-04-24 DIAGNOSIS — E785 Hyperlipidemia, unspecified: Secondary | ICD-10-CM | POA: Diagnosis not present

## 2015-04-24 DIAGNOSIS — I350 Nonrheumatic aortic (valve) stenosis: Secondary | ICD-10-CM | POA: Diagnosis not present

## 2015-04-26 DIAGNOSIS — H04123 Dry eye syndrome of bilateral lacrimal glands: Secondary | ICD-10-CM | POA: Diagnosis not present

## 2015-04-26 DIAGNOSIS — H2513 Age-related nuclear cataract, bilateral: Secondary | ICD-10-CM | POA: Diagnosis not present

## 2015-06-06 ENCOUNTER — Ambulatory Visit (INDEPENDENT_AMBULATORY_CARE_PROVIDER_SITE_OTHER): Payer: Medicare Other | Admitting: Pulmonary Disease

## 2015-06-06 ENCOUNTER — Encounter: Payer: Self-pay | Admitting: Pulmonary Disease

## 2015-06-06 ENCOUNTER — Other Ambulatory Visit: Payer: Self-pay | Admitting: Family Medicine

## 2015-06-06 VITALS — BP 116/72 | HR 56 | Ht 62.0 in | Wt 136.8 lb

## 2015-06-06 DIAGNOSIS — G4733 Obstructive sleep apnea (adult) (pediatric): Secondary | ICD-10-CM

## 2015-06-06 DIAGNOSIS — Z9989 Dependence on other enabling machines and devices: Principal | ICD-10-CM

## 2015-06-06 NOTE — Progress Notes (Signed)
Current Outpatient Prescriptions on File Prior to Visit  Medication Sig  . aspirin 81 MG tablet Take 81 mg by mouth daily.  Marland Kitchen doxazosin (CARDURA) 8 MG tablet Take 8 mg by mouth at bedtime.  . finasteride (PROSCAR) 5 MG tablet Take 5 mg by mouth daily.  . metoprolol succinate (TOPROL-XL) 25 MG 24 hr tablet Take 25 mg by mouth daily.   . Probiotic Product (PRO-BIOTIC BLEND) CAPS Take by mouth daily.  . tamsulosin (FLOMAX) 0.4 MG CAPS capsule    No current facility-administered medications on file prior to visit.    Chief Complaint  Patient presents with  . Follow-up    pt. states he wears CPAP 6-7hr. everynight. feels pressure is good. no supplies needed. DME: [facility]    Tests Auto CPAP 05/07/15 to 06/05/15 >> used on 30 of 30nights with average 5 hrs and 58 min.  Average AHI is 20.8 with median CPAP 6 cm H2O and 95 th percentile CPAP 10 cm H20.   Past medical history HLD, GERD, Diverticulosis, BPH  Past surgical hx, Medications, Allergies, Family hx, Social hx all reviewed.  Vital signs BP 116/72 mmHg  Pulse 56  Ht 5\' 2"  (1.575 m)  Wt 136 lb 12.8 oz (62.052 kg)  BMI 25.01 kg/m2  SpO2 100%   History of Present Illness: James Hardy is a 82 y.o. male with OSA.  He uses CPAP about 6 hours per night.  He has full face mask.  He is not aware of mask leak, but his wife notices this.  She also says he stills stop breathing sometimes at night.  Physical Exam:  General - No distress ENT - No sinus tenderness, no oral exudate, no LAN Cardiac - s1s2 regular, no murmur Chest - No wheeze/rales/dullness Back - No focal tenderness Abd - Soft, non-tender Ext - No edema Neuro - Normal strength Skin - No rashes Psych - normal mood, and behavior   Assessment/Plan:  Obstructive sleep apnea. His download shows his AHI still elevated and has significant air leak. Plan: - will have him get mask refitted - continue auto CPAP   Patient Instructions  Will arrange for CPAP mask  refit  Follow up in 6 months    Chesley Mires, MD [facility] Pager:  [phone]

## 2015-06-06 NOTE — Patient Instructions (Signed)
Will arrange for CPAP mask refit  Follow up in 6 months

## 2015-06-12 ENCOUNTER — Ambulatory Visit (INDEPENDENT_AMBULATORY_CARE_PROVIDER_SITE_OTHER): Payer: Medicare Other | Admitting: Family Medicine

## 2015-06-12 ENCOUNTER — Encounter: Payer: Self-pay | Admitting: Family Medicine

## 2015-06-12 VITALS — BP 122/70 | HR 62 | Temp 98.2°F | Ht 62.0 in | Wt 144.8 lb

## 2015-06-12 DIAGNOSIS — H6123 Impacted cerumen, bilateral: Secondary | ICD-10-CM | POA: Diagnosis not present

## 2015-06-12 DIAGNOSIS — B9789 Other viral agents as the cause of diseases classified elsewhere: Principal | ICD-10-CM

## 2015-06-12 DIAGNOSIS — J069 Acute upper respiratory infection, unspecified: Secondary | ICD-10-CM | POA: Diagnosis not present

## 2015-06-12 DIAGNOSIS — K219 Gastro-esophageal reflux disease without esophagitis: Secondary | ICD-10-CM | POA: Diagnosis not present

## 2015-06-12 NOTE — Progress Notes (Signed)
Subjective:    [name], male    DOB: [birth year], 82 y.o.   MRN: [id number]  HPI Acute Visit Patient seen today with a compliant of cough and worsening acid reflux times one day. Patient's wife currently recovering from upper respiratory infection. He began to develop a non-productive cough with post nasal drainage during the night.  He denies, fever, headache, fatigue, or shortness of breath. He hasn't taken any medication for the cough.  Patient reports feeling an increase in his acid reflux since developing the cough and postnasal drip.  Currently taking Omeprazole 20 mg daily for acid reflux.  no dysphagia.  Past Medical History  Diagnosis Date  . HYPERLIPIDEMIA 04/11/2009    Qualifier: Diagnosis of  By: Joyce Gross    . CERUMEN IMPACTION 04/11/2009    Qualifier: Diagnosis of  By: Joyce Gross    . INTERNAL HEMORRHOIDS 08/28/2006    Qualifier: Diagnosis of  By: Jerral Ralph    . APHTHOUS ULCERS 12/19/2009    Qualifier: Diagnosis of  By: [name] MD, Bruce    . ANGULAR CHEILITIS 04/11/2009    Qualifier: Diagnosis of  By: Joyce Gross    . GERD 12/02/2008    Qualifier: Diagnosis of  By: [name] MD, Bruce    . DIVERTICULOSIS, COLON 08/28/2006    Qualifier: Diagnosis of  By: Jerral Ralph    . OSTEOPOROSIS 08/21/2007    Qualifier: Diagnosis of  By: Jerral Ralph    . SLEEP APNEA 08/21/2007    Qualifier: Diagnosis of  By: Jerral Ralph    . COLONIC POLYPS, HX OF 12/02/2008    Qualifier: Diagnosis of  By: [name] MD, Bruce    . COLITIS, HX OF 08/21/2007    Qualifier: Diagnosis of  By: Jerral Ralph    . BENIGN PROSTATIC HYPERTROPHY, HX OF 08/21/2007    Qualifier: Diagnosis of  By: Jerral Ralph     Past Surgical History  Procedure Laterality Date  . Hemorrhoid surgery  2001    reports that he has never smoked. He does not have any smokeless tobacco history on file. He reports that he does not drink alcohol or  use illicit drugs. family history includes Asthma in his mother; Heart disease in his other. Allergies  Allergen Reactions  . Neosporin [Neomycin-Bacitracin Zn-Polymyx] Rash       Review of Systems  Constitutional: Negative for fever, fatigue and unexpected weight change.  HENT: Positive for congestion, postnasal drip and rhinorrhea.   Eyes: Negative.   Respiratory: Positive for cough.   Cardiovascular: Negative.   Gastrointestinal: Negative.   Endocrine: Negative.   Genitourinary: Negative.   Musculoskeletal: Negative.   Skin: Negative.   Allergic/Immunologic: Negative.   Neurological: Negative.   Hematological: Negative.   Psychiatric/Behavioral: Negative.        Objective:   Physical Exam  Constitutional: He is oriented to person, place, and time. He appears well-developed and well-nourished.  HENT:  Head: Normocephalic and atraumatic.  Does have some cerumen in both canals obscuring eardrums  Eyes: Conjunctivae and EOM are normal. Pupils are equal, round, and reactive to light.  Neck: Normal range of motion.  Cardiovascular: Normal rate, regular rhythm and normal heart sounds.   Pulmonary/Chest: Effort normal and breath sounds normal.  Musculoskeletal: Normal range of motion.  Neurological: He is alert and oriented to person, place, and time.  Skin: Skin is warm and dry.  Psychiatric: He has a normal mood and affect. His behavior is normal. Judgment and thought  content normal.          Assessment & Plan:  1. Acute viral upper respiratory Infection.  Plan: Take over the counter Mucinex as needed for cough. Increase fluid intake.  2. Acid Reflux likely flaring due to post nasal drip and cough.  Plan: Continue Omeprazole 20 daily and may consider adding over the counter Zantac or Pepcid.    #3 cerumen both canals. Both ear canals irrigated with removal

## 2015-06-12 NOTE — Progress Notes (Signed)
Pre visit review using our clinic review tool, if applicable. No additional management support is needed unless otherwise documented below in the visit note. 

## 2015-06-12 NOTE — Patient Instructions (Signed)
Take over the counter Mucinex for cough. Add Zantac or Pepcid at bedtime for acid reflux related burning.   Upper Respiratory Infection, Adult Most upper respiratory infections (URIs) are a viral infection of the air passages leading to the lungs. A URI affects the nose, throat, and upper air passages. The most common type of URI is nasopharyngitis and is typically referred to as "the common cold." URIs run their course and usually go away on their own. Most of the time, a URI does not require medical attention, but sometimes a bacterial infection in the upper airways can follow a viral infection. This is called a secondary infection. Sinus and middle ear infections are common types of secondary upper respiratory infections. Bacterial pneumonia can also complicate a URI. A URI can worsen asthma and chronic obstructive pulmonary disease (COPD). Sometimes, these complications can require emergency medical care and may be life threatening.  CAUSES Almost all URIs are caused by viruses. A virus is a type of germ and can spread from one person to another.  RISKS FACTORS You may be at risk for a URI if:   You smoke.   You have chronic heart or lung disease.  You have a weakened defense (immune) system.   You are very young or very old.   You have nasal allergies or asthma.  You work in crowded or poorly ventilated areas.  You work in health care facilities or schools. SIGNS AND SYMPTOMS  Symptoms typically develop 2-3 days after you come in contact with a cold virus. Most viral URIs last 7-10 days. However, viral URIs from the influenza virus (flu virus) can last 14-18 days and are typically more severe. Symptoms may include:   Runny or stuffy (congested) nose.   Sneezing.   Cough.   Sore throat.   Headache.   Fatigue.   Fever.   Loss of appetite.   Pain in your forehead, behind your eyes, and over your cheekbones (sinus pain).  Muscle aches.  DIAGNOSIS  Your  health care provider may diagnose a URI by:  Physical exam.  Tests to check that your symptoms are not due to another condition such as:  Strep throat.  Sinusitis.  Pneumonia.  Asthma. TREATMENT  A URI goes away on its own with time. It cannot be cured with medicines, but medicines may be prescribed or recommended to relieve symptoms. Medicines may help:  Reduce your fever.  Reduce your cough.  Relieve nasal congestion. HOME CARE INSTRUCTIONS   Take medicines only as directed by your health care provider.   Gargle warm saltwater or take cough drops to comfort your throat as directed by your health care provider.  Use a warm mist humidifier or inhale steam from a shower to increase air moisture. This may make it easier to breathe.  Drink enough fluid to keep your urine clear or pale yellow.   Eat soups and other clear broths and maintain good nutrition.   Rest as needed.   Return to work when your temperature has returned to normal or as your health care provider advises. You may need to stay home longer to avoid infecting others. You can also use a face mask and careful hand washing to prevent spread of the virus.  Increase the usage of your inhaler if you have asthma.   Do not use any tobacco products, including cigarettes, chewing tobacco, or electronic cigarettes. If you need help quitting, ask your health care provider. PREVENTION  The best way to protect yourself  from getting a cold is to practice good hygiene.   Avoid oral or hand contact with people with cold symptoms.   Wash your hands often if contact occurs.  There is no clear evidence that vitamin C, vitamin E, echinacea, or exercise reduces the chance of developing a cold. However, it is always recommended to get plenty of rest, exercise, and practice good nutrition.  SEEK MEDICAL CARE IF:   You are getting worse rather than better.   Your symptoms are not controlled by medicine.   You have  chills.  You have worsening shortness of breath.  You have brown or red mucus.  You have yellow or brown nasal discharge.  You have pain in your face, especially when you bend forward.  You have a fever.  You have swollen neck glands.  You have pain while swallowing.  You have white areas in the back of your throat. SEEK IMMEDIATE MEDICAL CARE IF:   You have severe or persistent:  Headache.  Ear pain.  Sinus pain.  Chest pain.  You have chronic lung disease and any of the following:  Wheezing.  Prolonged cough.  Coughing up blood.  A change in your usual mucus.  You have a stiff neck.  You have changes in your:  Vision.  Hearing.  Thinking.  Mood. MAKE SURE YOU:   Understand these instructions.  Will watch your condition.  Will get help right away if you are not doing well or get worse.   This information is not intended to replace advice given to you by your health care provider. Make sure you discuss any questions you have with your health care provider.   Document Released: 09/04/2000 Document Revised: 07/26/2014 Document Reviewed: 06/16/2013 Elsevier Interactive Patient Education [employer].

## 2015-06-20 ENCOUNTER — Ambulatory Visit (INDEPENDENT_AMBULATORY_CARE_PROVIDER_SITE_OTHER): Payer: Medicare Other | Admitting: Family Medicine

## 2015-06-20 ENCOUNTER — Telehealth: Payer: Self-pay | Admitting: Family Medicine

## 2015-06-20 ENCOUNTER — Encounter: Payer: Self-pay | Admitting: Family Medicine

## 2015-06-20 VITALS — BP 120/54 | HR 63 | Temp 98.9°F | Wt 145.0 lb

## 2015-06-20 DIAGNOSIS — A499 Bacterial infection, unspecified: Secondary | ICD-10-CM | POA: Diagnosis not present

## 2015-06-20 DIAGNOSIS — R05 Cough: Secondary | ICD-10-CM

## 2015-06-20 DIAGNOSIS — B9689 Other specified bacterial agents as the cause of diseases classified elsewhere: Secondary | ICD-10-CM

## 2015-06-20 DIAGNOSIS — J329 Chronic sinusitis, unspecified: Secondary | ICD-10-CM

## 2015-06-20 DIAGNOSIS — R509 Fever, unspecified: Secondary | ICD-10-CM | POA: Diagnosis not present

## 2015-06-20 DIAGNOSIS — R059 Cough, unspecified: Secondary | ICD-10-CM

## 2015-06-20 LAB — POCT INFLUENZA A: Rapid Influenza A Ag: NEGATIVE

## 2015-06-20 MED ORDER — AMOXICILLIN-POT CLAVULANATE 875-125 MG PO TABS: 1.0000 | ORAL_TABLET | Freq: Two times a day (BID) | ORAL | Status: DC

## 2015-06-20 NOTE — Telephone Encounter (Signed)
Yes this is fine Juliann Pulse but he may have to wait until Dr. Yong Channel sees his last patient of the moring but you can put him on the schedule for this morning.

## 2015-06-20 NOTE — Progress Notes (Signed)
PCP: [name], MD  Subjective:  James Hardy is a 82 y.o. year old very pleasant male patient who presents with sinusitis symptoms including nasal congestion, sinus tenderness.   Started Sunday of last week. Saw Dr. [name] and diagnosed with URI. Had been slowly improving. Feels like deep in his throat when coughs. Sinus congestion bothersome with yellow drainage- worsened since visit with Dr. [name]. Pressure in maxillary area- he asks if he can use tylenol for this. Last night he had Temperature 100.6 and felt worse yesterday after initially seeing improvement. has not had any fever other days and has not had this morning- does feel better this morning. Has been taking mucinex- dm helps some.   -sick contacts/travel/risks: denies flu exposure.   ROS-denies SOB, NVD, tooth pain  Pertinent Past Medical History-  Patient Active Problem List   Diagnosis Date Noted  . Aortic stenosis   . Angina pectoris ([place])   . Hyperlipidemia 04/11/2009  . GERD 12/02/2008  . Sleep apnea 08/21/2007  . BPH (benign prostatic hypertrophy) 08/21/2007    Medications- reviewed  Current Outpatient Prescriptions  Medication Sig Dispense Refill  . aspirin 81 MG tablet Take 81 mg by mouth daily.    Marland Kitchen doxazosin (CARDURA) 8 MG tablet Take 8 mg by mouth at bedtime.    . finasteride (PROSCAR) 5 MG tablet Take 5 mg by mouth daily.    . metoprolol succinate (TOPROL-XL) 25 MG 24 hr tablet Take 25 mg by mouth daily.     Marland Kitchen omeprazole (PRILOSEC) 20 MG capsule TAKE 1 CAPSULE (20 MG TOTAL) BY MOUTH DAILY. 90 capsule 1  . Probiotic Product (PRO-BIOTIC BLEND) CAPS Take by mouth daily.    . tamsulosin (FLOMAX) 0.4 MG CAPS capsule     . amoxicillin-clavulanate (AUGMENTIN) 875-125 MG tablet Take 1 tablet by mouth 2 (two) times daily. 14 tablet 0   No current facility-administered medications for this visit.    Objective: BP 120/54 mmHg  Pulse 63  Temp(Src) 98.9 F (37.2 C)  Wt 145 lb (65.772 kg)  SpO2  95% Gen: NAD, resting comfortably HEENT: Turbinates erythematous with yellow drainage, TM normal, pharynx mildly erythematous with no tonsilar exudate or edema- i do note some yellow drainage in throat, maxillary sinus tenderness CV: RRR. 4/6 SEM. no rubs or gallops Lungs: CTAB no crackles, wheeze, rhonchi Abdomen: soft/nontender/nondistended/normal bowel sounds.  Ext: no edema Skin: warm, dry, no rash  Results for orders placed or performed in visit on 06/20/15 (from the past 24 hour(s))  POCT Influenza A     Status: None   Collection Time: 06/20/15 12:12 PM  Result Value Ref Range   Rapid Influenza A Ag negative    Assessment/Plan:  Sinsusitis Bacterial based on double sickening (you were improving then got worse yesterday with fever Tested for flu given fever and negative  Treatment: -other symptomatic care with mucinex is reasonable -Antibiotic indicated: yes  Finally, we reviewed reasons to return to care including if symptoms worsen or persist or new concerns arise.  Meds ordered this encounter  Medications  . amoxicillin-clavulanate (AUGMENTIN) 875-125 MG tablet    Sig: Take 1 tablet by mouth 2 (two) times daily.    Dispense:  14 tablet    Refill:  0  highr risk patient with history aortic stenosis and due to age

## 2015-06-20 NOTE — Telephone Encounter (Signed)
Pt has been put on the schedule

## 2015-06-20 NOTE — Patient Instructions (Signed)
Sinsusitis Bacterial based on double sickening (you were improving then got worse yesterday with fever  Treatment: -other symptomatic care with mucinex is reasonable -Antibiotic indicated: yes  Finally, we reviewed reasons to return to care including if symptoms worsen or persist or new concerns arise.  Meds ordered this encounter  Medications  . amoxicillin-clavulanate (AUGMENTIN) 875-125 MG tablet    Sig: Take 1 tablet by mouth 2 (two) times daily.    Dispense:  14 tablet    Refill:  0

## 2015-06-20 NOTE — Telephone Encounter (Addendum)
Wife is seeing dr Yong Channel today, is a [facility] pt. Pt is also having the same symptoms (upper resp) and wants to know if Dr Yong Channel will see him as well at the same time? Wife's appointment is at 11:15

## 2015-06-20 NOTE — Telephone Encounter (Signed)
See below

## 2015-07-18 ENCOUNTER — Encounter: Payer: Self-pay | Admitting: Cardiology

## 2015-07-18 DIAGNOSIS — I209 Angina pectoris, unspecified: Secondary | ICD-10-CM | POA: Diagnosis not present

## 2015-07-18 DIAGNOSIS — K219 Gastro-esophageal reflux disease without esophagitis: Secondary | ICD-10-CM | POA: Diagnosis not present

## 2015-07-18 DIAGNOSIS — G4733 Obstructive sleep apnea (adult) (pediatric): Secondary | ICD-10-CM | POA: Diagnosis not present

## 2015-07-18 DIAGNOSIS — E785 Hyperlipidemia, unspecified: Secondary | ICD-10-CM | POA: Diagnosis not present

## 2015-07-18 DIAGNOSIS — I350 Nonrheumatic aortic (valve) stenosis: Secondary | ICD-10-CM | POA: Diagnosis not present

## 2015-07-18 NOTE — Progress Notes (Signed)
[name], male   DOB: 02/13/1929, 82 y.o.   MRN: [id number]  James Hardy, James Hardy  Date of visit:  07/18/2015 DOB:  03-29-1928    Age:  82 yrs. Medical record number:  [id number]     Account number:  [id number] Primary Care Provider: Carolann Littler ____________________________ CURRENT DIAGNOSES  1. Angina pectoris, unspecified  2. Dyspnea  3. Nonrheumatic aortic (valve) stenosis  4. Hyperlipidemia  5. Gastro-esophageal reflux disease without esophagitis  6. Sleep apnea ____________________________ ALLERGIES  Neosporin, Rash ____________________________ MEDICATIONS  1. omeprazole 20 mg capsule,delayed release, 1 p.o. daily  2. finasteride 5 mg tablet, 1 p.o. daily  3. doxazosin 8 mg tablet, 1 p.o. daily  4. aspirin 81 mg chewable tablet, 1 p.o. daily  5. metoprolol succinate ER 25 mg tablet,extended release 24 hr, 1 p.o. daily  6. tamsulosin 0.4 mg capsule, 1 p.o. daily ____________________________ HISTORY OF PRESENT ILLNESS This very nice 82 year old male is seen for followup of shortness of breath and aortic stenosis. He has a history of clinical angina as well as moderate aortic stenosis. I have followed him since 2014. In January he had now progressed to severe aortic stenosis and at the time I cannot get that his symptoms have changed much. I had him come back and it appears that his symptoms have become progressive over the past 3 months. He now is unable to. His chain saw to cut wood work he can do this year ago and cannot even carry wood without shortness of breath and can only walk a half a lap around the track now without getting shortness of breath. He also complains of substernal chest discomfort with it he gets over exertion. He had a negative myocardial perfusion scan a number of years ago and also had some coronary calcification on a CT scan predominantly involving the LAD. He also has known sleep apnea. He's not had any rest symptoms and does not have PND or  orthopnea. ____________________________ PAST HISTORY  Past Medical Illnesses:  hyperlipidemia, GERD, sleep apnea, BPH, lumbar disc disease;  Cardiovascular Illnesses:  aortic stenosis, CAD;  Surgical Procedures:  hemmoroidectomy;  NYHA Classification:  II;  Canadian Angina Classification:  Class 2: Angina with moderate exertion;  Cardiology Procedures-Invasive:  no history of prior cardiac procedures;  Cardiology Procedures-Noninvasive:  lexiscan cardiolite November 2014, echocardiogram January 2016, echocardiogram January 2017;  LVEF of 60% documented via echocardiogram on 04/24/2015,   ____________________________ CARDIO-PULMONARY TEST DATES EKG Date:  06/28/2014;  Nuclear Study Date:  02/17/2013;   ____________________________ FAMILY HISTORY Brother -- Brother dead, Unknown Disease Brother -- Brother dead, Myocardial infarction Father -- Father dead, Chronic obstructive lung disease, Influenza/Pneumonia Mother -- Mother dead, Coronary Artery Disease, Asthma Sister -- Sister alive with problem, Alzheimer's disease Sister -- Sister alive and well Sister -- Sister alive and well ____________________________ SOCIAL HISTORY Alcohol Use:  no alcohol use;  Smoking:  never smoked;  Diet:  regular diet;  Lifestyle:  married;  Exercise:  walking 3 days per week and exercise strength for back;  Occupation:  retired Film/video editor;  Residence:  lives with wife;   ____________________________ REVIEW OF SYSTEMS General:  denies recent weight change, fatique or change in exercise tolerance. Eyes: cataracts Respiratory: mild dyspnea with exertion Cardiovascular:  please review HPI Abdominal: history of GERD and dyspepsia Genitourinary-Male: nocturia  Musculoskeletal:  chronic back pain Neurological:  denies headaches, stroke, or TIA  ____________________________ PHYSICAL EXAMINATION VITAL SIGNS  Blood Pressure:  116/60 Sitting, Right arm, regular cuff  Pulse:  60/min. Respirations:  14/min. Weight:   144.00 lbs. Height:  63"BMI: 25  Constitutional:  pleasant white male in no acute distress Skin:  scattered ecchymosis present Head:  normocephalic, normal hair pattern, no masses or tenderness ENT:  ears, nose and throat reveal no gross abnormalities.  Dentition good. Neck:  supple, without massess. No JVD, thyromegaly or carotid bruits. Carotid upstroke delayed  Chest:  normal symmetry, clear to auscultation Cardiac:  regular rhythm, normal S1, diminished S2, no S3 or S4, grade 2/6 holosystolic murmur at aortic area radiating to neck Peripheral Pulses:  the femoral,dorsalis pedis, and posterior tibial pulses are full and equal bilaterally with no bruits auscultated. Extremities & Back:  1+ edema, bilateral venous insufficiency changes present Neurological:  no gross motor or sensory deficits noted, affect appropriate, oriented x3. ____________________________ MOST RECENT LIPID PANEL 12/20/14  CHOL 93 mg/dL, TRIG 42 mg/dL, HDL 27 mg/dL, CHOL/HDL 3.4 Ratio, and LDL 58 mg/dL ____________________________ IMPRESSIONS/PLAN  1. Severe aortic stenosis which is now symptomatic 2. Clinical angina with coronary calcification noted on CT scan 3. Hyperlipidemia  Recommendations:  He has severe aortic stenosis with mean gradient of 44 mm now by echo and is significantly symptomatic. I have recommended that he consider aortic valve replacement and I believe at his age he would be a candidate for TAVR. I discussed the indications and procedure in full detail with he and his wife and recommended that he consult with Dr. Sherren Mocha to initiate the workup and evaluation for this.  ____________________________ TODAYS ORDERS  1. Cardiology Consult: Burt Knack                       ____________________________ Cardiology Physician:  Kerry Hough MD [facility]

## 2015-07-19 ENCOUNTER — Telehealth: Payer: Self-pay

## 2015-07-19 NOTE — Telephone Encounter (Signed)
Pt referred to Dr Burt Knack for TAVR consult per Dr Wynonia Lawman.  Dr Burt Knack would like to see the pt on Monday 07/24/15 at 2:00.  Dr Burt Knack is not scheduled to be in the clinic that day but he plans to come into the office to see this patient.  I left a message for the pt to contact our office.

## 2015-07-19 NOTE — Telephone Encounter (Signed)
Dr Burt Knack has had a cancellation for TAVR consult on 4/28 at 11:30.  I have left the pt a message to contact our office and arrange appointment (45 minute visit).

## 2015-07-21 ENCOUNTER — Encounter: Payer: Self-pay | Admitting: Cardiovascular Disease

## 2015-07-21 ENCOUNTER — Ambulatory Visit (INDEPENDENT_AMBULATORY_CARE_PROVIDER_SITE_OTHER): Payer: Medicare Other | Admitting: Cardiovascular Disease

## 2015-07-21 VITALS — BP 118/50 | HR 52 | Ht 62.0 in | Wt 141.1 lb

## 2015-07-21 DIAGNOSIS — I359 Nonrheumatic aortic valve disorder, unspecified: Secondary | ICD-10-CM | POA: Diagnosis not present

## 2015-07-21 DIAGNOSIS — I350 Nonrheumatic aortic (valve) stenosis: Secondary | ICD-10-CM

## 2015-07-21 LAB — PROTIME-INR
INR: 1.18 (ref <1.50)
PROTHROMBIN TIME: 15.2 s (ref 11.6–15.2)

## 2015-07-21 LAB — CBC
HCT: 30 % — ABNORMAL LOW (ref 38.5–50.0)
HEMOGLOBIN: 10 g/dL — AB (ref 13.2–17.1)
MCH: 32.7 pg (ref 27.0–33.0)
MCHC: 33.3 g/dL (ref 32.0–36.0)
MCV: 98 fL (ref 80.0–100.0)
MPV: 10 fL (ref 7.5–12.5)
Platelets: 197 10*3/uL (ref 140–400)
RBC: 3.06 MIL/uL — AB (ref 4.20–5.80)
RDW: 15.2 % — ABNORMAL HIGH (ref 11.0–15.0)
WBC: 8 10*3/uL (ref 3.8–10.8)

## 2015-07-21 LAB — BASIC METABOLIC PANEL
BUN: 27 mg/dL — AB (ref 7–25)
CHLORIDE: 108 mmol/L (ref 98–110)
CO2: 22 mmol/L (ref 20–31)
Calcium: 8.7 mg/dL (ref 8.6–10.3)
Creat: 1.1 mg/dL (ref 0.70–1.11)
GLUCOSE: 80 mg/dL (ref 65–99)
Potassium: 4.4 mmol/L (ref 3.5–5.3)
SODIUM: 135 mmol/L (ref 135–146)

## 2015-07-21 NOTE — Progress Notes (Signed)
Cardiology Office Note Date:  07/21/2015   ID:  [name], DOB [birth year], MRN [id number]  PCP:  [name], MD  Cardiologist/Referring:  Tollie Eth, MD  Chief Complaint  Patient presents with  . New Evaluation    Aortic stenosis/TAVR consult. +cp/sob with activity    History of Present Illness: James Hardy is a 82 y.o. male who presents for TAVR evaluation.   He's had a long-standing heart murmur and has been followed by Dr. Wynonia Lawman since 2014. At that time he was noted to have moderate aortic stenosis and has been followed with serial echo and clinical evaluations.   The patient had a recent echocardiogram in January 2017 demonstrating preserved LV systolic function, heavily calcified aortic valve, and hemodynamic findings consistent with severe aortic stenosis with mean gradient of 44 mmHg. Considering his echo findings and progressive exertional symptoms, he is referred for evaluation of aortic stenosis.   He has previously been very active. Last year he was able to garden, work with a tiller, and use a chainsaw. He has noted progressive symptoms over the past year and his symptoms have accelerated over the past 3 months. He now tires out very easily and develops shortness of breath as well as chest discomfort. Symptoms resolve after a few minutes of rest. Describes chest pain as a dull ache in the center of the chest. Symptoms are worse if he does work after eating. He went for a walk last week and had to stop and rest during the walk which was less than 1/4 mile in all.  The patient is here with his wife and son. 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 He hasn't been hospitalized in recent years and has enjoyed good health.   Past Medical History  Diagnosis Date  . HYPERLIPIDEMIA 04/11/2009    Qualifier: Diagnosis of  By: Joyce Gross    . CERUMEN IMPACTION 04/11/2009    Qualifier: Diagnosis of  By:  Joyce Gross    . INTERNAL HEMORRHOIDS 08/28/2006    Qualifier: Diagnosis of  By: Jerral Ralph    . APHTHOUS ULCERS 12/19/2009    Qualifier: Diagnosis of  By: [name] MD, Bruce    . ANGULAR CHEILITIS 04/11/2009    Qualifier: Diagnosis of  By: Joyce Gross    . GERD 12/02/2008    Qualifier: Diagnosis of  By: [name] MD, Bruce    . DIVERTICULOSIS, COLON 08/28/2006    Qualifier: Diagnosis of  By: Jerral Ralph    . OSTEOPOROSIS 08/21/2007    Qualifier: Diagnosis of  By: Jerral Ralph    . SLEEP APNEA 08/21/2007    Qualifier: Diagnosis of  By: Jerral Ralph    . COLONIC POLYPS, HX OF 12/02/2008    Qualifier: Diagnosis of  By: [name] MD, Bruce    . COLITIS, HX OF 08/21/2007    Qualifier: Diagnosis of  By: Jerral Ralph    . BENIGN PROSTATIC HYPERTROPHY, HX OF 08/21/2007    Qualifier: Diagnosis of  By: Jerral Ralph      Past Surgical History  Procedure Laterality Date  . Hemorrhoid surgery  2001    Current Outpatient Prescriptions  Medication Sig Dispense Refill  . aspirin 81 MG tablet Take 81 mg by mouth daily.    Marland Kitchen doxazosin (CARDURA) 8 MG tablet Take 8 mg by mouth at bedtime.    . finasteride (PROSCAR) 5 MG tablet Take 5 mg by mouth daily.    Marland Kitchen  metoprolol succinate (TOPROL-XL) 25 MG 24 hr tablet Take 25 mg by mouth daily.     Marland Kitchen omeprazole (PRILOSEC) 20 MG capsule TAKE 1 CAPSULE (20 MG TOTAL) BY MOUTH DAILY. 90 capsule 1  . Probiotic Product (PRO-BIOTIC BLEND) CAPS Take 1 capsule by mouth daily as needed (digestive issues).     . tamsulosin (FLOMAX) 0.4 MG CAPS capsule Take 0.4 mg by mouth daily.      No current facility-administered medications for this visit.    Allergies:   Neosporin   Social History:  The patient  reports that he has never smoked. He does not have any smokeless tobacco history on file. He reports that he does not drink alcohol or use illicit drugs.   Family History:  The patient's  family history includes  Asthma in his mother; Heart disease in his other.    ROS:  Please see the history of present illness.  Otherwise, review of systems is positive for easy bruising, snoring, and difficulty urinating.  All other systems are reviewed and negative.   PHYSICAL EXAM: VS:  BP 118/50 mmHg  Pulse 52  Ht 5\' 2"  (1.575 m)  Wt 141 lb 1.9 oz (64.012 kg)  BMI 25.80 kg/m2 , BMI Body mass index is 25.8 kg/(m^2). GEN: Well nourished, well developed, in no acute distress HEENT: normal Neck: no JVD, no masses. Carotid upstrokes normal Cardiac: RRR with 3/6 harsh late peaking systolic murmur, audible but diminished A2, no diastolic murmur              Respiratory:  clear to auscultation bilaterally, normal work of breathing GI: soft, nontender, nondistended, + BS MS: no deformity or atrophy Ext: no pretibial edema, pedal pulses 2+= bilaterally Skin: warm and dry, no rash Neuro:  Strength and sensation are intact Psych: euthymic mood, full affect  EKG:  EKG is not ordered today.  Recent Labs: No results found for requested labs within last 365 days.   Lipid Panel     Component Value Date/Time   CHOL 110 04/11/2009 0955   TRIG 26.0 04/11/2009 0955   HDL 31.70* 04/11/2009 0955   CHOLHDL 3 04/11/2009 0955   VLDL 5.2 04/11/2009 0955   LDLCALC 73 04/11/2009 0955      Wt Readings from Last 3 Encounters:  07/21/15 141 lb 1.9 oz (64.012 kg)  06/20/15 145 lb (65.772 kg)  06/12/15 144 lb 12.8 oz (65.681 kg)    Cardiac Studies Reviewed: 2-D echocardiogram is personally reviewed and demonstrates normal LV systolic function, moderate concentric hypertrophy, severe calcification of all 3 valve leaflets with severe aortic stenosis, peak and mean gradients of 66 and 44 mmHg, respectively. The colostomy is 4.1 m/s. There is mild to moderate tricuspid insufficiency and trace mitral insufficiency.  STS Risk Calculator: Procedure: AV Replacement  Risk of Mortality: 3.909%  Morbidity or Mortality: 24.35%    Long Length of Stay: 9.19%  Short Length of Stay: 24.483%  Permanent Stroke: 1.875%  Prolonged Ventilation: 13.536%  DSW Infection: 0.221%  Renal Failure: 6.381%  Reoperation: 10.16%  ASSESSMENT AND PLAN: 82 year old male with stage D, severe symptomatic aortic stenosis. The patient has become highly symptomatic over the last 3 months with both exertional angina and shortness of breath. I have personally reviewed his echo images as described above. This study confirms the presence of severe calcific degenerative aortic stenosis. I have reviewed the natural history of aortic stenosis with the patient and their family members who are present today. We have discussed the limitations of  medical therapy and the poor prognosis associated with symptomatic aortic stenosis. We have reviewed potential treatment options, including palliative medical therapy, conventional surgical aortic valve replacement, and transcatheter aortic valve replacement. We discussed treatment options in the context of this patient's specific comorbid medical conditions. The next step in his evaluation should be a right and left heart catheterization to define coronary anatomy and assess hemodynamics. I have reviewed the risks, indications, and alternatives to cardiac catheterization, possible angioplasty, and stenting with the patient. Risks include but are not limited to bleeding, infection, vascular injury, stroke, myocardial infection, arrhythmia, kidney injury, radiation-related injury in the case of prolonged fluoroscopy use, emergency cardiac surgery, and death. The patient understands the risks of serious complication is 1-2 in [id number] with diagnostic cardiac cath and 1-2% or less with angioplasty/stenting.   Will determine next steps based on results of his heart catheterization. Anticipate evaluation with our multidisciplinary heart valve team. The patient understands that he may have isolated aortic valve disease versus a  combination of aortic valve and coronary disease. He understands that this will impact the decision for TAVR versus conventional heart surgery. If he does not have significant CAD, TAVR might be a good treatment option for the functional but elderly patient with a moderately elevated STS risk score.   Current medicines are reviewed with the patient today.  The patient does not have concerns regarding medicines.  Labs/ tests ordered today include:   Orders Placed This Encounter  Procedures  . Basic Metabolic Panel (BMET)  . CBC  . INR/PT    Disposition:   FU pending test results  Signed, Sherren Mocha, MD  07/21/2015 12:33 PM    [name], [place], [place]  [address] Phone: [phone]; Fax: [phone]

## 2015-07-21 NOTE — Patient Instructions (Signed)
Medication Instructions:  Your physician recommends that you continue on your current medications as directed. Please refer to the Current Medication list given to you today.  Labwork: Your physician recommends that you have lab work today: BMP, CBC and PT/INR  Testing/Procedures: Your physician has requested that you have a cardiac catheterization. Cardiac catheterization is used to diagnose and/or treat various heart conditions. Doctors may recommend this procedure for a number of different reasons. The most common reason is to evaluate chest pain. Chest pain can be a symptom of coronary artery disease (CAD), and cardiac catheterization can show whether plaque is narrowing or blocking your heart's arteries. This procedure is also used to evaluate the valves, as well as measure the blood flow and oxygen levels in different parts of your heart. For further information please visit [email or web address]. Please follow instruction sheet, as given.  Follow-Up: We will arrange further TAVR evaluation following your cardiac catheterization.   Any Other Special Instructions Will Be Listed Below (If Applicable).     If you need a refill on your cardiac medications before your next appointment, please call your pharmacy.

## 2015-07-23 ENCOUNTER — Other Ambulatory Visit: Payer: Self-pay | Admitting: Cardiovascular Disease

## 2015-07-23 DIAGNOSIS — I350 Nonrheumatic aortic (valve) stenosis: Secondary | ICD-10-CM

## 2015-07-25 ENCOUNTER — Other Ambulatory Visit: Payer: Self-pay | Admitting: *Deleted

## 2015-07-25 ENCOUNTER — Ambulatory Visit (HOSPITAL_COMMUNITY)
Admission: RE | Admit: 2015-07-25 | Discharge: 2015-07-25 | Disposition: A | Discharge status: Home / Self Care | Payer: Medicare Other | Source: Ambulatory Visit | Attending: Cardiovascular Disease | Admitting: Cardiovascular Disease

## 2015-07-25 ENCOUNTER — Encounter (HOSPITAL_COMMUNITY)
Admission: RE | Disposition: A | Discharge status: Home / Self Care | Payer: Self-pay | Source: Ambulatory Visit | Attending: Cardiovascular Disease

## 2015-07-25 DIAGNOSIS — G4730 Sleep apnea, unspecified: Secondary | ICD-10-CM | POA: Diagnosis not present

## 2015-07-25 DIAGNOSIS — Z8249 Family history of ischemic heart disease and other diseases of the circulatory system: Secondary | ICD-10-CM | POA: Diagnosis not present

## 2015-07-25 DIAGNOSIS — Z7982 Long term (current) use of aspirin: Secondary | ICD-10-CM | POA: Diagnosis not present

## 2015-07-25 DIAGNOSIS — I2584 Coronary atherosclerosis due to calcified coronary lesion: Secondary | ICD-10-CM | POA: Insufficient documentation

## 2015-07-25 DIAGNOSIS — K219 Gastro-esophageal reflux disease without esophagitis: Secondary | ICD-10-CM | POA: Diagnosis not present

## 2015-07-25 DIAGNOSIS — I2510 Atherosclerotic heart disease of native coronary artery without angina pectoris: Secondary | ICD-10-CM | POA: Diagnosis not present

## 2015-07-25 DIAGNOSIS — E785 Hyperlipidemia, unspecified: Secondary | ICD-10-CM | POA: Diagnosis not present

## 2015-07-25 DIAGNOSIS — I350 Nonrheumatic aortic (valve) stenosis: Secondary | ICD-10-CM | POA: Insufficient documentation

## 2015-07-25 HISTORY — PX: CARDIAC CATHETERIZATION: SHX172

## 2015-07-25 LAB — POCT I-STAT 3, ART BLOOD GAS (G3+)
Acid-base deficit: 3 mmol/L — ABNORMAL HIGH (ref 0.0–2.0)
Bicarbonate: 22.2 mEq/L (ref 20.0–24.0)
O2 SAT: 99 %
PCO2 ART: 41.8 mmHg (ref 35.0–45.0)
PH ART: 7.333 — AB (ref 7.350–7.450)
PO2 ART: 129 mmHg — AB (ref 80.0–100.0)
TCO2: 23 mmol/L (ref 0–100)

## 2015-07-25 LAB — POCT I-STAT 3, VENOUS BLOOD GAS (G3P V)
ACID-BASE DEFICIT: 5 mmol/L — AB (ref 0.0–2.0)
Bicarbonate: 21.3 mEq/L (ref 20.0–24.0)
O2 Saturation: 67 %
PCO2 VEN: 42.2 mmHg — AB (ref 45.0–50.0)
PO2 VEN: 38 mmHg (ref 31.0–45.0)
TCO2: 23 mmol/L (ref 0–100)
pH, Ven: 7.31 — ABNORMAL HIGH (ref 7.250–7.300)

## 2015-07-25 SURGERY — RIGHT/LEFT HEART CATH AND CORONARY ANGIOGRAPHY
Anesthesia: LOCAL

## 2015-07-25 MED ORDER — SODIUM CHLORIDE 0.9 % IV SOLN: INTRAVENOUS | Status: DC

## 2015-07-25 MED ORDER — ONDANSETRON HCL 4 MG/2ML IJ SOLN: 4.0000 mg | Freq: Four times a day (QID) | INTRAMUSCULAR | Status: DC | PRN

## 2015-07-25 MED ORDER — HEPARIN (PORCINE) IN NACL 2-0.9 UNIT/ML-% IJ SOLN
INTRAMUSCULAR | Status: DC | PRN
Start: 2015-07-25 — End: 2015-07-25
  Administered 2015-07-25: 1000 mL

## 2015-07-25 MED ORDER — SODIUM CHLORIDE 0.9 % IV SOLN: 250.0000 mL | INTRAVENOUS | Status: DC | PRN

## 2015-07-25 MED ORDER — ASPIRIN 81 MG PO CHEW
81.0000 mg | CHEWABLE_TABLET | ORAL | Status: AC
  Administered 2015-07-25: 81 mg via ORAL

## 2015-07-25 MED ORDER — MIDAZOLAM HCL 2 MG/2ML IJ SOLN
INTRAMUSCULAR | Status: AC
  Filled 2015-07-25: qty 2

## 2015-07-25 MED ORDER — HEPARIN SODIUM (PORCINE) 1000 UNIT/ML IJ SOLN
INTRAMUSCULAR | Status: AC
  Filled 2015-07-25: qty 1

## 2015-07-25 MED ORDER — ASPIRIN 81 MG PO CHEW
CHEWABLE_TABLET | ORAL | Status: AC
  Administered 2015-07-25: 81 mg via ORAL
  Filled 2015-07-25: qty 1

## 2015-07-25 MED ORDER — HEPARIN SODIUM (PORCINE) 1000 UNIT/ML IJ SOLN
INTRAMUSCULAR | Status: DC | PRN
  Administered 2015-07-25: 4000 [IU] via INTRAVENOUS

## 2015-07-25 MED ORDER — LIDOCAINE HCL (PF) 1 % IJ SOLN
INTRAMUSCULAR | Status: DC | PRN
  Administered 2015-07-25: 5 mL

## 2015-07-25 MED ORDER — SODIUM CHLORIDE 0.9 % WEIGHT BASED INFUSION: 1.0000 mL/kg/h | INTRAVENOUS | Status: DC

## 2015-07-25 MED ORDER — SODIUM CHLORIDE 0.9% FLUSH: 3.0000 mL | INTRAVENOUS | Status: DC | PRN

## 2015-07-25 MED ORDER — MIDAZOLAM HCL 2 MG/2ML IJ SOLN
INTRAMUSCULAR | Status: DC | PRN
  Administered 2015-07-25: 1 mg via INTRAVENOUS

## 2015-07-25 MED ORDER — IOPAMIDOL (ISOVUE-370) INJECTION 76%
INTRAVENOUS | Status: DC | PRN
  Administered 2015-07-25: 60 mL via INTRAVENOUS

## 2015-07-25 MED ORDER — SODIUM CHLORIDE 0.9 % WEIGHT BASED INFUSION
3.0000 mL/kg/h | INTRAVENOUS | Status: AC
  Administered 2015-07-25: 3 mL/kg/h via INTRAVENOUS

## 2015-07-25 MED ORDER — VERAPAMIL HCL 2.5 MG/ML IV SOLN
INTRAVENOUS | Status: AC
  Filled 2015-07-25: qty 2

## 2015-07-25 MED ORDER — HEPARIN (PORCINE) IN NACL 2-0.9 UNIT/ML-% IJ SOLN
INTRAMUSCULAR | Status: AC
  Filled 2015-07-25: qty 1000

## 2015-07-25 MED ORDER — ACETAMINOPHEN 325 MG PO TABS: 650.0000 mg | ORAL_TABLET | ORAL | Status: DC | PRN

## 2015-07-25 MED ORDER — LIDOCAINE HCL (PF) 1 % IJ SOLN
INTRAMUSCULAR | Status: AC
Start: 2015-07-25 — End: 2015-07-25
  Filled 2015-07-25: qty 30

## 2015-07-25 MED ORDER — SODIUM CHLORIDE 0.9% FLUSH: 3.0000 mL | Freq: Two times a day (BID) | INTRAVENOUS | Status: DC

## 2015-07-25 MED ORDER — FENTANYL CITRATE (PF) 100 MCG/2ML IJ SOLN
INTRAMUSCULAR | Status: AC
  Filled 2015-07-25: qty 2

## 2015-07-25 MED ORDER — IOPAMIDOL (ISOVUE-370) INJECTION 76%
INTRAVENOUS | Status: AC
  Filled 2015-07-25: qty 100

## 2015-07-25 MED ORDER — FENTANYL CITRATE (PF) 100 MCG/2ML IJ SOLN
INTRAMUSCULAR | Status: DC | PRN
  Administered 2015-07-25: 25 ug via INTRAVENOUS

## 2015-07-25 SURGICAL SUPPLY — 13 items
CATH BALLN WEDGE 5F 110CM (CATHETERS) ×1 IMPLANT
CATH INFINITI 5 FR JL3.5 (CATHETERS) ×1 IMPLANT
CATH INFINITI 5FR ANG PIGTAIL (CATHETERS) ×1 IMPLANT
CATH INFINITI JR4 5F (CATHETERS) ×1 IMPLANT
DEVICE RAD COMP TR BAND LRG (VASCULAR PRODUCTS) ×1 IMPLANT
GLIDESHEATH SLEND SS 6F .021 (SHEATH) ×1 IMPLANT
KIT HEART LEFT (KITS) ×2 IMPLANT
PACK CARDIAC CATHETERIZATION (CUSTOM PROCEDURE TRAY) ×2 IMPLANT
SHEATH FAST CATH BRACH 5F 5CM (SHEATH) ×1 IMPLANT
TRANSDUCER W/STOPCOCK (MISCELLANEOUS) ×3 IMPLANT
TUBING CIL FLEX 10 FLL-RA (TUBING) ×2 IMPLANT
WIRE HI TORQ VERSACORE-J 145CM (WIRE) ×1 IMPLANT
WIRE SAFE-T 1.5MM-J .035X260CM (WIRE) ×1 IMPLANT

## 2015-07-25 NOTE — Interval H&P Note (Signed)
History and Physical Interval Note:  07/25/2015 12:41 PM  James Hardy  has presented today for surgery, with the diagnosis of aortic stenosis  The various methods of treatment have been discussed with the patient and family. After consideration of risks, benefits and other options for treatment, the patient has consented to  Procedure(s): Right/Left Heart Cath and Coronary Angiography (N/A) as a surgical intervention .  The patient's history has been reviewed, patient examined, no change in status, stable for surgery.  I have reviewed the patient's chart and labs.  Questions were answered to the patient's satisfaction.     Sherren Mocha

## 2015-07-25 NOTE — Research (Signed)
[place] Study Informed Consent   Subject Name: James Hardy  Subject met inclusion and exclusion criteria.  The informed consent form, study requirements and expectations were reviewed with the subject and questions and concerns were addressed prior to the signing of the consent form.  The subject verbalized understanding of the trial requirements.  The subject agreed to participate in the [place] trial and signed the informed consent at 1055 on 07/25/2015.  The informed consent was obtained prior to performance of any protocol-specific procedures for the subject.  A copy of the signed informed consent was given to the subject and a copy was placed in the subject's medical record.  Blossom Hoops 07/25/2015, 4:39 PM

## 2015-07-25 NOTE — Discharge Instructions (Signed)
Radial Site Care °Refer to this sheet in the next few weeks. These instructions provide you with information about caring for yourself after your procedure. Your health care provider may also give you more specific instructions. Your treatment has been planned according to current medical practices, but problems sometimes occur. Call your health care provider if you have any problems or questions after your procedure. °WHAT TO EXPECT AFTER THE PROCEDURE °After your procedure, it is typical to have the following: °· Bruising at the radial site that usually fades within 1-2 weeks. °· Blood collecting in the tissue (hematoma) that may be painful to the touch. It should usually decrease in size and tenderness within 1-2 weeks. °HOME CARE INSTRUCTIONS °· Take medicines only as directed by your health care provider. °· You may shower 24-48 hours after the procedure or as directed by your health care provider. Remove the bandage (dressing) and gently wash the site with plain soap and water. Pat the area dry with a clean towel. Do not rub the site, because this may cause bleeding. °· Do not take baths, swim, or use a hot tub until your health care provider approves. °· Check your insertion site every day for redness, swelling, or drainage. °· Do not apply powder or lotion to the site. °· Do not flex or bend the affected arm for 24 hours or as directed by your health care provider. °· Do not push or pull heavy objects with the affected arm for 24 hours or as directed by your health care provider. °· Do not lift over 10 lb (4.5 kg) for 5 days after your procedure or as directed by your health care provider. °· Ask your health care provider when it is okay to: °¨ Return to work or school. °¨ Resume usual physical activities or sports. °¨ Resume sexual activity. °· Do not drive home if you are discharged the same day as the procedure. Have someone else drive you. °· You may drive 24 hours after the procedure unless otherwise  instructed by your health care provider. °· Do not operate machinery or power tools for 24 hours after the procedure. °· If your procedure was done as an outpatient procedure, which means that you went home the same day as your procedure, a responsible adult should be with you for the first 24 hours after you arrive home. °· Keep all follow-up visits as directed by your health care provider. This is important. °SEEK MEDICAL CARE IF: °· You have a fever. °· You have chills. °· You have increased bleeding from the radial site. Hold pressure on the site. °SEEK IMMEDIATE MEDICAL CARE IF: °· You have unusual pain at the radial site. °· You have redness, warmth, or swelling at the radial site. °· You have drainage (other than a small amount of blood on the dressing) from the radial site. °· The radial site is bleeding, and the bleeding does not stop after 30 minutes of holding steady pressure on the site. °· Your arm or hand becomes pale, cool, tingly, or numb. °  °This information is not intended to replace advice given to you by your health care provider. Make sure you discuss any questions you have with your health care provider. °  °Document Released: 04/13/2010 Document Revised: 04/01/2014 Document Reviewed: 09/27/2013 °Elsevier Interactive Patient Education ©2016 [employer] ° °

## 2015-07-25 NOTE — H&P (View-Only) (Signed)
Cardiology Office Note Date:  07/21/2015   ID:  [name], DOB 1928-06-29, MRN [id number]  PCP:  [name], MD  Cardiologist/Referring:  Tollie Eth, MD  Chief Complaint  Patient presents with  . New Evaluation    Aortic stenosis/TAVR consult. +cp/sob with activity    History of Present Illness: James Hardy is a 82 y.o. male who presents for TAVR evaluation.   He's had a long-standing heart murmur and has been followed by Dr. Wynonia Lawman since 2014. At that time he was noted to have moderate aortic stenosis and has been followed with serial echo and clinical evaluations.   The patient had a recent echocardiogram in January 2017 demonstrating preserved LV systolic function, heavily calcified aortic valve, and hemodynamic findings consistent with severe aortic stenosis with mean gradient of 44 mmHg. Considering his echo findings and progressive exertional symptoms, he is referred for evaluation of aortic stenosis.   He has previously been very active. Last year he was able to garden, work with a tiller, and use a chainsaw. He has noted progressive symptoms over the past year and his symptoms have accelerated over the past 3 months. He now tires out very easily and develops shortness of breath as well as chest discomfort. Symptoms resolve after a few minutes of rest. Describes chest pain as a dull ache in the center of the chest. Symptoms are worse if he does work after eating. He went for a walk last week and had to stop and rest during the walk which was less than 1/4 mile in all.  The patient is here with his wife and son. 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 He hasn't been hospitalized in recent years and has enjoyed good health.   Past Medical History  Diagnosis Date  . HYPERLIPIDEMIA 04/11/2009    Qualifier: Diagnosis of  By: Joyce Gross    . CERUMEN IMPACTION 04/11/2009    Qualifier: Diagnosis of  By:  Joyce Gross    . INTERNAL HEMORRHOIDS 08/28/2006    Qualifier: Diagnosis of  By: Jerral Ralph    . APHTHOUS ULCERS 12/19/2009    Qualifier: Diagnosis of  By: [name] MD, Bruce    . ANGULAR CHEILITIS 04/11/2009    Qualifier: Diagnosis of  By: Joyce Gross    . GERD 12/02/2008    Qualifier: Diagnosis of  By: [name] MD, Bruce    . DIVERTICULOSIS, COLON 08/28/2006    Qualifier: Diagnosis of  By: Jerral Ralph    . OSTEOPOROSIS 08/21/2007    Qualifier: Diagnosis of  By: Jerral Ralph    . SLEEP APNEA 08/21/2007    Qualifier: Diagnosis of  By: Jerral Ralph    . COLONIC POLYPS, HX OF 12/02/2008    Qualifier: Diagnosis of  By: [name] MD, Bruce    . COLITIS, HX OF 08/21/2007    Qualifier: Diagnosis of  By: Jerral Ralph    . BENIGN PROSTATIC HYPERTROPHY, HX OF 08/21/2007    Qualifier: Diagnosis of  By: Jerral Ralph      Past Surgical History  Procedure Laterality Date  . Hemorrhoid surgery  2001    Current Outpatient Prescriptions  Medication Sig Dispense Refill  . aspirin 81 MG tablet Take 81 mg by mouth daily.    Marland Kitchen doxazosin (CARDURA) 8 MG tablet Take 8 mg by mouth at bedtime.    . finasteride (PROSCAR) 5 MG tablet Take 5 mg by mouth daily.    Marland Kitchen  metoprolol succinate (TOPROL-XL) 25 MG 24 hr tablet Take 25 mg by mouth daily.     Marland Kitchen omeprazole (PRILOSEC) 20 MG capsule TAKE 1 CAPSULE (20 MG TOTAL) BY MOUTH DAILY. 90 capsule 1  . Probiotic Product (PRO-BIOTIC BLEND) CAPS Take 1 capsule by mouth daily as needed (digestive issues).     . tamsulosin (FLOMAX) 0.4 MG CAPS capsule Take 0.4 mg by mouth daily.      No current facility-administered medications for this visit.    Allergies:   Neosporin   Social History:  The patient  reports that he has never smoked. He does not have any smokeless tobacco history on file. He reports that he does not drink alcohol or use illicit drugs.   Family History:  The patient's  family history includes  Asthma in his mother; Heart disease in his other.    ROS:  Please see the history of present illness.  Otherwise, review of systems is positive for easy bruising, snoring, and difficulty urinating.  All other systems are reviewed and negative.   PHYSICAL EXAM: VS:  BP 118/50 mmHg  Pulse 52  Ht 5\' 2"  (1.575 m)  Wt 141 lb 1.9 oz (64.012 kg)  BMI 25.80 kg/m2 , BMI Body mass index is 25.8 kg/(m^2). GEN: Well nourished, well developed, in no acute distress HEENT: normal Neck: no JVD, no masses. Carotid upstrokes normal Cardiac: RRR with 3/6 harsh late peaking systolic murmur, audible but diminished A2, no diastolic murmur              Respiratory:  clear to auscultation bilaterally, normal work of breathing GI: soft, nontender, nondistended, + BS MS: no deformity or atrophy Ext: no pretibial edema, pedal pulses 2+= bilaterally Skin: warm and dry, no rash Neuro:  Strength and sensation are intact Psych: euthymic mood, full affect  EKG:  EKG is not ordered today.  Recent Labs: No results found for requested labs within last 365 days.   Lipid Panel     Component Value Date/Time   CHOL 110 04/11/2009 0955   TRIG 26.0 04/11/2009 0955   HDL 31.70* 04/11/2009 0955   CHOLHDL 3 04/11/2009 0955   VLDL 5.2 04/11/2009 0955   LDLCALC 73 04/11/2009 0955      Wt Readings from Last 3 Encounters:  07/21/15 141 lb 1.9 oz (64.012 kg)  06/20/15 145 lb (65.772 kg)  06/12/15 144 lb 12.8 oz (65.681 kg)    Cardiac Studies Reviewed: 2-D echocardiogram is personally reviewed and demonstrates normal LV systolic function, moderate concentric hypertrophy, severe calcification of all 3 valve leaflets with severe aortic stenosis, peak and mean gradients of 66 and 44 mmHg, respectively. The colostomy is 4.1 m/s. There is mild to moderate tricuspid insufficiency and trace mitral insufficiency.  STS Risk Calculator: Procedure: AV Replacement  Risk of Mortality: 3.909%  Morbidity or Mortality: 24.35%    Long Length of Stay: 9.19%  Short Length of Stay: 24.483%  Permanent Stroke: 1.875%  Prolonged Ventilation: 13.536%  DSW Infection: 0.221%  Renal Failure: 6.381%  Reoperation: 10.16%  ASSESSMENT AND PLAN: 82 year old male with stage D, severe symptomatic aortic stenosis. The patient has become highly symptomatic over the last 3 months with both exertional angina and shortness of breath. I have personally reviewed his echo images as described above. This study confirms the presence of severe calcific degenerative aortic stenosis. I have reviewed the natural history of aortic stenosis with the patient and their family members who are present today. We have discussed the limitations of  medical therapy and the poor prognosis associated with symptomatic aortic stenosis. We have reviewed potential treatment options, including palliative medical therapy, conventional surgical aortic valve replacement, and transcatheter aortic valve replacement. We discussed treatment options in the context of this patient's specific comorbid medical conditions. The next step in his evaluation should be a right and left heart catheterization to define coronary anatomy and assess hemodynamics. I have reviewed the risks, indications, and alternatives to cardiac catheterization, possible angioplasty, and stenting with the patient. Risks include but are not limited to bleeding, infection, vascular injury, stroke, myocardial infection, arrhythmia, kidney injury, radiation-related injury in the case of prolonged fluoroscopy use, emergency cardiac surgery, and death. The patient understands the risks of serious complication is 1-2 in [id number] with diagnostic cardiac cath and 1-2% or less with angioplasty/stenting.   Will determine next steps based on results of his heart catheterization. Anticipate evaluation with our multidisciplinary heart valve team. The patient understands that he may have isolated aortic valve disease versus a  combination of aortic valve and coronary disease. He understands that this will impact the decision for TAVR versus conventional heart surgery. If he does not have significant CAD, TAVR might be a good treatment option for the functional but elderly patient with a moderately elevated STS risk score.   Current medicines are reviewed with the patient today.  The patient does not have concerns regarding medicines.  Labs/ tests ordered today include:   Orders Placed This Encounter  Procedures  . Basic Metabolic Panel (BMET)  . CBC  . INR/PT    Disposition:   FU pending test results  Signed, Sherren Mocha, MD  07/21/2015 12:33 PM    [address], [place], [place]  [address] Phone: [phone]; Fax: [phone]

## 2015-07-26 ENCOUNTER — Encounter (HOSPITAL_COMMUNITY): Payer: Self-pay | Admitting: Cardiovascular Disease

## 2015-07-26 MED FILL — Verapamil HCl IV Soln 2.5 MG/ML: INTRAVENOUS | Qty: 2 | Status: AC

## 2015-07-31 ENCOUNTER — Institutional Professional Consult (permissible substitution) (INDEPENDENT_AMBULATORY_CARE_PROVIDER_SITE_OTHER): Payer: Medicare Other | Admitting: Surgery

## 2015-07-31 ENCOUNTER — Other Ambulatory Visit: Payer: Self-pay | Admitting: *Deleted

## 2015-07-31 ENCOUNTER — Ambulatory Visit (HOSPITAL_COMMUNITY)
Admission: RE | Admit: 2015-07-31 | Discharge: 2015-07-31 | Disposition: A | Discharge status: Home / Self Care | Payer: Medicare Other | Source: Ambulatory Visit | Attending: Cardiovascular Disease | Admitting: Cardiovascular Disease

## 2015-07-31 ENCOUNTER — Encounter: Payer: Self-pay | Admitting: Surgery

## 2015-07-31 ENCOUNTER — Encounter (HOSPITAL_COMMUNITY): Payer: Self-pay

## 2015-07-31 ENCOUNTER — Ambulatory Visit (HOSPITAL_COMMUNITY): Payer: Medicare Other

## 2015-07-31 VITALS — BP 109/58 | HR 68 | Resp 20 | Ht 62.0 in | Wt 142.0 lb

## 2015-07-31 DIAGNOSIS — R918 Other nonspecific abnormal finding of lung field: Secondary | ICD-10-CM | POA: Diagnosis not present

## 2015-07-31 DIAGNOSIS — I350 Nonrheumatic aortic (valve) stenosis: Secondary | ICD-10-CM | POA: Diagnosis not present

## 2015-07-31 DIAGNOSIS — Z01818 Encounter for other preprocedural examination: Secondary | ICD-10-CM | POA: Diagnosis not present

## 2015-07-31 LAB — PULMONARY FUNCTION TEST
DL/VA % pred: 61 %
DL/VA: 2.48 ml/min/mmHg/L
DLCO UNC: 13.2 ml/min/mmHg
DLCO unc % pred: 57 %
FEF 25-75 Post: 2.25 L/sec
FEF 25-75 Pre: 1.14 L/sec
FEF2575-%Change-Post: 98 %
FEF2575-%PRED-POST: 202 %
FEF2575-%Pred-Pre: 102 %
FEV1-%CHANGE-POST: 19 %
FEV1-%PRED-PRE: 104 %
FEV1-%Pred-Post: 125 %
FEV1-POST: 2.28 L
FEV1-Pre: 1.91 L
FEV1FVC-%Change-Post: 2 %
FEV1FVC-%Pred-Pre: 95 %
FEV6-%Change-Post: 16 %
FEV6-%PRED-POST: 130 %
FEV6-%PRED-PRE: 111 %
FEV6-PRE: 2.74 L
FEV6-Post: 3.2 L
FEV6FVC-%CHANGE-POST: 0 %
FEV6FVC-%PRED-POST: 107 %
FEV6FVC-%PRED-PRE: 107 %
FVC-%Change-Post: 16 %
FVC-%PRED-POST: 121 %
FVC-%Pred-Pre: 103 %
FVC-Post: 3.27 L
FVC-Pre: 2.81 L
POST FEV6/FVC RATIO: 98 %
PRE FEV6/FVC RATIO: 98 %
Post FEV1/FVC ratio: 70 %
Pre FEV1/FVC ratio: 68 %
RV % PRED: 200 %
RV: 4.75 L
TLC % pred: 139 %
TLC: 7.89 L

## 2015-07-31 MED ORDER — ALBUTEROL SULFATE (2.5 MG/3ML) 0.083% IN NEBU
2.5000 mg | INHALATION_SOLUTION | Freq: Once | RESPIRATORY_TRACT | Status: AC
  Administered 2015-07-31: 2.5 mg via RESPIRATORY_TRACT

## 2015-07-31 MED ORDER — IOPAMIDOL (ISOVUE-370) INJECTION 76%
INTRAVENOUS | Status: AC
  Administered 2015-07-31: 100 mL
  Filled 2015-07-31: qty 100

## 2015-07-31 MED ORDER — IOPAMIDOL (ISOVUE-370) INJECTION 76%
INTRAVENOUS | Status: AC
  Administered 2015-07-31: 50 mL
  Filled 2015-07-31: qty 50

## 2015-07-31 NOTE — Progress Notes (Signed)
[name], male   DOB: May 30, 1928, 82 y.o.   MRN: [id number]  [facility] CONSULTATION REPORT  Referring Provider is Sherren Mocha, MD PCP is [name], MD  Chief Complaint  Patient presents with  . Aortic Stenosis    Surgical eval for TAVR, Cardiac Cath 07/25/15, PFT's 07/31/15, ECHO 04/24/15    HPI:  The patient is an 82 year old gentleman with a history of heart murmur who has been followed by Dr. Wynonia Lawman since 2014 with moderate aortic stenosis. The patient says that over the past year he has developed symptoms of exertional fatigue, shortness of breath and chest discomfort that have worsened over the past 4-5 months. 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 He has been walking but has to stop after about 1/4 mile. His symptoms resolve within a few minutes of stopping. He describes the chest discomfort as a dull ache in the center of his chest. His most recent echo in 03/2015 showed progression to severe AS with a mean gradient of 44 mm Hg with an EF of 55%. There was trace MR. He was referred to Dr. Burt Knack and cath showed a 60% focal stenosis in the mid LAD with normal right heart pressures.  The patient is a retired Chief Financial Officer and lives with his wife who is in good condition. His wife and son are with him today. He still drives, exercises three days per week and wants to stay active in his yard.  Past Medical History  Diagnosis Date  . HYPERLIPIDEMIA 04/11/2009    Qualifier: Diagnosis of  By: Joyce Gross    . CERUMEN IMPACTION 04/11/2009    Qualifier: Diagnosis of  By: Joyce Gross    . INTERNAL HEMORRHOIDS 08/28/2006    Qualifier: Diagnosis of  By: Jerral Ralph    . APHTHOUS ULCERS 12/19/2009    Qualifier: Diagnosis of  By: [name] MD, Bruce    . ANGULAR CHEILITIS 04/11/2009      Qualifier: Diagnosis of  By: Joyce Gross    . GERD 12/02/2008    Qualifier: Diagnosis of  By: [name] MD, Bruce    . DIVERTICULOSIS, COLON 08/28/2006    Qualifier: Diagnosis of  By: Jerral Ralph    . OSTEOPOROSIS 08/21/2007    Qualifier: Diagnosis of  By: Jerral Ralph    . SLEEP APNEA 08/21/2007    Qualifier: Diagnosis of  By: Jerral Ralph    . COLONIC POLYPS, HX OF 12/02/2008    Qualifier: Diagnosis of  By: [name] MD, Bruce    . COLITIS, HX OF 08/21/2007    Qualifier: Diagnosis of  By: Jerral Ralph    . BENIGN PROSTATIC HYPERTROPHY, HX OF 08/21/2007    Qualifier: Diagnosis of  By: Jerral Ralph      Past Surgical History  Procedure Laterality Date  . Hemorrhoid surgery  2001  . Cardiac catheterization N/A 07/25/2015    Procedure: Right/Left Heart Cath and Coronary Angiography;  Surgeon: Sherren Mocha, MD;  Location: [facility];  Service: Cardiovascular;  Laterality: N/A;    Family History  Problem Relation Age of Onset  . Heart disease Other   . Asthma Mother     Social History   Social History  . Marital Status: Married    Spouse Name: N/A  . Number of Children:  N/A  . Years of Education: N/A   Occupational History  . Not on file.   Social History Main Topics  . Smoking status: Never Smoker   . Smokeless tobacco: Not on file  . Alcohol Use: No  . Drug Use: No  . Sexual Activity: Not on file   Other Topics Concern  . Not on file   Social History Narrative    Current Outpatient Prescriptions  Medication Sig Dispense Refill  . aspirin 81 MG tablet Take 81 mg by mouth daily.    . calcium-vitamin D (OSCAL WITH D) 250-125 MG-UNIT tablet Take 2 tablets by mouth 2 (two) times daily.    . Cholecalciferol (VITAMIN D3) 5000 units TABS Take 5,000 Units by mouth daily.    Marland Kitchen doxazosin (CARDURA) 8 MG tablet Take 8 mg by mouth at bedtime.    . finasteride (PROSCAR) 5 MG tablet Take 5 mg by mouth daily.    Marland Kitchen  glucosamine-chondroitin 500-400 MG tablet Take 2 tablets by mouth 2 (two) times daily.    . metoprolol succinate (TOPROL-XL) 25 MG 24 hr tablet Take 25 mg by mouth daily.     . Multiple Vitamin (MULTIVITAMIN) tablet Take 1 tablet by mouth daily.    . NON FORMULARY CPAP machine    . Omega-3 Fatty Acids (FISH OIL) 1000 MG CAPS Take 1,000 mg by mouth daily.    Marland Kitchen omeprazole (PRILOSEC) 20 MG capsule TAKE 1 CAPSULE (20 MG TOTAL) BY MOUTH DAILY. 90 capsule 1  . Probiotic Product (PRO-BIOTIC BLEND) CAPS Take 1 capsule by mouth daily as needed (digestive issues).     . tamsulosin (FLOMAX) 0.4 MG CAPS capsule Take 0.4 mg by mouth daily.      No current facility-administered medications for this visit.    Allergies  Allergen Reactions  . Neosporin [Neomycin-Bacitracin Zn-Polymyx] Rash      Review of Systems:   General:  normal appetite, decreased energy, no weight gain, no weight loss, no fever  Cardiac:  Has  chest pain with exertion, no chest pain at rest, has moderate SOB with mild exertion, no resting SOB, no PND, no orthopnea, no palpitations, no arrhythmia, no atrial fibrillation, no LE edema, no dizzy spells, no syncope  Respiratory:  exertional shortness of breath, no home oxygen, no productive cough, no dry cough, no bronchitis, no wheezing, no hemoptysis, no asthma, no pain with inspiration or cough, no sleep apnea, no CPAP at night  GI:   no difficulty swallowing, has reflux, no frequent heartburn, has hiatal hernia, no abdominal pain, no constipation, no diarrhea, no hematochezia, no hematemesis, no melena  GU:   no dysuria,  no frequency, no urinary tract infection, no hematuria, has enlarged prostate, no kidney stones, no kidney disease  Vascular:  no pain suggestive of claudication, no pain in feet, no leg cramps, no varicose veins, no DVT, no non-healing foot ulcer  Neuro:   no stroke, no TIA's, no seizures, no headaches, notemporary blindness one eye,  no slurred speech, no  peripheral neuropathy, no chronic pain, no instability of gait, no memory/cognitive dysfunction  Musculoskeletal: no arthritis, no joint swelling, no myalgias, no difficulty walking, no mobility   Skin:   no rash, no itching, no skin infections, no pressure sores or ulcerations  Psych:   no anxiety, no depression, no nervousness, no unusual recent stress  Eyes:   no blurry vision, no floaters, no recent vision changes,  wears glasses   ENT:   no hearing loss, no loose  or painful teeth, no dentures, last saw dentist recently  Hematologic:  has easy bruising, no abnormal bleeding, no clotting disorder, no frequent epistaxis  Endocrine:  no diabetes, does not check CBG's at home       Physical Exam:   BP 109/58 mmHg  Pulse 68  Resp 20  Ht 5\' 2"  (1.575 m)  Wt 142 lb (64.411 kg)  BMI 25.97 kg/m2  SpO2 98%  General:  Elderly but  well-appearing  HEENT:  Unremarkable , NCAT, PERLA, EOMI, oropharynx clear, teeth in good condition  Neck:   no JVD, no bruits, no adenopathy or thyromegaly. Transmitted murmur to both sides of the neck.  Chest:   clear to auscultation, symmetrical breath sounds, no wheezes, no rhonchi   CV:   RRR, grade III/VI crescendo/decrescendo murmur heard best at RSB,  no diastolic murmur  Abdomen:  soft, non-tender, no masses or organomegaly  Extremities:  warm, well-perfused, pulses palpable, no LE edema  Rectal/GU  Deferred  Neuro:   Grossly non-focal and symmetrical throughout  Skin:   Clean and dry, no rashes, no breakdown   Diagnostic Tests:  2D echo done in Dr. Thurman Coyer office 04/24/2015. Report reviewed   Sherren Mocha, MD (Primary)      Procedures    Right/Left Heart Cath and Coronary Angiography    Conclusion     Mid LAD lesion, 60% stenosed.  1. Single vessel CAD with moderate focal stenosis of the mid-LAD 2. Severely calcified aortic valve with known severe AS 3. Normal right-sided hemodynamics  Recommend: continued evaluation by the  Multidisciplinary Heart Valve Team     Indications    Severe aortic stenosis [I35.0 (ICD-10-CM)]    Technique and Indications    INDICATION: Severe aortic stenosis  PROCEDURAL DETAILS: There was an indwelling IV in a right antecubital vein. Using normal sterile technique, the IV was changed out for a 5 Fr brachial sheath over a 0.018 inch wire. The right wrist was then prepped, draped, and anesthetized with 1% lidocaine. Using the modified Seldinger technique a 5/6 French Slender sheath was placed in the right radial artery. Intra-arterial verapamil was administered through the radial artery sheath. IV heparin was administered after a JR4 catheter was advanced into the central aorta. A Swan-Ganz catheter was used for the right heart catheterization. Standard protocol was followed for recording of right heart pressures and sampling of oxygen saturations. Fick cardiac output was calculated. Standard Judkins catheters were used for selective coronary angiography and aortic root angiography. There were no immediate procedural complications. The patient was transferred to the post catheterization recovery area for further monitoring.  During this procedure the patient is administered a total of Versed 1 mg and Fentanyl 25 mg to achieve and maintain moderate conscious sedation. The patient's heart rate, blood pressure, and oxygen saturation are monitored continuously during the procedure. The period of conscious sedation is 25 minutes, of which I was present face-to-face 100% of this time.  Estimated blood loss <50 mL. There were no immediate complications during the procedure.    Coronary Findings    Dominance: Right   Left Anterior Descending   . Mid LAD lesion, 60% stenosed. Eccentric, calcified LAD stenosis, moderately stenosed at approximately 60%. No other significant CAD     Right Coronary Artery  . Vessel is normal in caliber. Normal RCA      Left Heart    Aortic Valve There  is severe aortic valve stenosis, and no aortic valve regurgitation. The aortic valve is calcified.  There is restricted aortic valve motion. Severe aortic stenosis by noninvasive assessment. Heavily calcified aortic valve leaflets with restricted leaflet mobility, no significant AI on aortic root angiography.    Coronary Diagrams    Diagnostic Diagram            Implants     No implant documentation for this case.    PACS Images    Show images for Cardiac catheterization     Link to Procedure Log    Procedure Log      Hemo Data       Most Recent Value   Fick Cardiac Output  5.04 L/min   Fick Cardiac Output Index  3.05 (L/min)/BSA   RA A Wave  8 mmHg   RA V Wave  6 mmHg   RA Mean  5 mmHg   RV Systolic Pressure  35 mmHg   RV Diastolic Pressure  1 mmHg   RV EDP  7 mmHg   PA Systolic Pressure  37 mmHg   PA Diastolic Pressure  15 mmHg   PA Mean  22 mmHg   PW A Wave  15 mmHg   PW V Wave  17 mmHg   PW Mean  12 mmHg   AO Systolic Pressure  123XX123 mmHg   AO Diastolic Pressure  55 mmHg   AO Mean  81 mmHg   QP/QS  1   TPVR Index  7.2 HRUI   TSVR Index  26.5 HRUI   PVR SVR Ratio  0.14   TPVR/TSVR Ratio  0.27   Gated cardiac CT and CTA of the chest, abdomen and pelvis done this afternoon and official reports pending. He appears to have adequate transfemoral access bilaterally although reformatted images not available yet.   Procedure: AV Replacement + CAB  Risk of Mortality: 3.843%  Morbidity or Mortality: 19.216%  Long Length of Stay: 8.157%  Short Length of Stay: 25.431%  Permanent Stroke: 1.813%  Prolonged Ventilation: 10.127%  DSW Infection: 0.15%  Renal Failure: 4.939%  Reoperation: 9.578%   Impression:  This 82 year old gentleman has stage D severe symptomatic aortic stenosis with NYHA class 2 symptoms of exertional fatigue, shortness of breath and chest discomfort due to chronic diastolic congestive heart failure. His echo shows a  trileaflet valve with severely calcified and poorly mobile leaflets with a mean gradient of 44 mm Hg. Cardiac cath shows a focal 60% mid LAD stenosis of unclear significance. He does have exertional dull substernal chest aching that is likely due to the combination of severe AS with this LAD stenosis. The stenosis is certainly not high grade and could be treated medically for now and stented later if he has persistent exertional chest discomfort after AVR. I looked at his CT scans although the reformatted images are not available yet. He appears to have adequate transfemoral access.   I think AVR is indicated in this patient with symptomatic severe AS. His STS PROM is about 4% and I think TAVR is a better option for him at 82 years of age. The patient and his wife and son were counseled at length regarding treatment alternatives for management of severe symptomatic aortic stenosis. Alternative approaches such as conventional aortic valve replacement, transcatheter aortic valve replacement, and palliative medical therapy were compared and contrasted at length. The risks associated with conventional surgical aortic valve replacement were been discussed in detail, as were expectations for post-operative convalescence. Long-term prognosis with medical therapy was discussed. He would like to proceed with TAVR  evaluation.   We discussed complications that might develop including but not limited to risks of death, stroke, paravalvular leak, aortic dissection or other major vascular complications, aortic annulus rupture, device embolization, cardiac rupture or perforation, mitral regurgitation, acute myocardial infarction, arrhythmia, heart block or bradycardia requiring permanent pacemaker placement, congestive heart failure, respiratory failure, renal failure, pneumonia, infection, other late complications related to structural valve deterioration or migration, or other complications that might ultimately cause a  temporary or permanent loss of functional independence or other long term morbidity. The patient provides full informed consent for the procedure as described and all questions were answered.       Plan:  He is scheduled for a PT evaluation tomorrow and will see Dr. Roxy Manns for a second surgical opinion on Friday. I will discuss the cath findings with Dr. Burt Knack but I suspect that he will want to treat his coronary stenosis medically for now. We will tentatively schedule transfemoral TAVR next Tuesday 5/16 pending further evaluation.    Gaye Pollack, MD 07/31/2015 3:48 PM

## 2015-08-01 ENCOUNTER — Encounter: Payer: Self-pay | Admitting: Thoracic Surgery (Cardiothoracic Vascular Surgery)

## 2015-08-01 ENCOUNTER — Ambulatory Visit: Payer: Medicare Other | Attending: Cardiovascular Disease | Admitting: Physical Therapy

## 2015-08-01 ENCOUNTER — Institutional Professional Consult (permissible substitution) (INDEPENDENT_AMBULATORY_CARE_PROVIDER_SITE_OTHER): Payer: Medicare Other | Admitting: Thoracic Surgery (Cardiothoracic Vascular Surgery)

## 2015-08-01 ENCOUNTER — Encounter: Payer: Self-pay | Admitting: Physical Therapy

## 2015-08-01 VITALS — BP 124/76 | HR 64 | Resp 20 | Ht 62.0 in | Wt 142.0 lb

## 2015-08-01 DIAGNOSIS — R2681 Unsteadiness on feet: Secondary | ICD-10-CM | POA: Diagnosis not present

## 2015-08-01 DIAGNOSIS — I350 Nonrheumatic aortic (valve) stenosis: Secondary | ICD-10-CM

## 2015-08-01 DIAGNOSIS — R293 Abnormal posture: Secondary | ICD-10-CM | POA: Diagnosis not present

## 2015-08-01 NOTE — Progress Notes (Signed)
[facility] CONSULTATION REPORT  Referring Provider is Sherren Mocha, MD  Primary Cardiologist is Jacolyn Reedy, MD PCP is [name], MD  Chief Complaint  Patient presents with  . Aortic Stenosis    2nd TAVR eval, review studies/test    HPI:  Patient is in an 82 year old gentleman with aortic stenosis, OSA on CPAP, GERD and BPH who has been referred for a second surgical opinion to discuss treatment options for management of stage D severe symptomatic aortic stenosis.  The patient has been remarkably active and healthy for the majority of his adult life.  Several years ago he was noted to have a systolic murmur on physical exam. Echocardiogram revealed findings consistent with moderate aortic stenosis with normal left ventricular systolic function. The patient has been followed by Dr. Wynonia Lawman and serial echocardiograms have revealed gradual progression and the severity of disease.  Follow-up echocardiogram performed January 2017 was reported to demonstrate severe aortic stenosis with peak velocity across the aortic valve of 4.1 m/s corresponding to mean transvalvular gradient estimated 44 mmHg. Left ventricular systolic function remained normal.  Over the past year the patient has experienced new onset symptoms of exertional shortness of breath and fatigue.  He was referred to Dr. Burt Knack and underwent diagnostic cardiac catheterization 07/25/2015. He was found have moderate coronary artery disease with 60% stenosis of the mid left anterior descending coronary artery and otherwise mild nonobstructive plaque. Treatment options were discussed and CT angiography has been performed to evaluate the feasibility of transcatheter aortic valve replacement.  The patient was referred to Dr. Cyndia Bent for surgical consultation and was felt to be at least moderate risk for conventional surgical aortic valve replacement. The  patient has been referred for a second surgical opinion.  The patient is married and lives with his wife in [place]. He has retired having previously worked for [employer].  He has remained physically active throughout his retirement. He reports no physical limitations whatsoever with the exception of the recent development of symptoms of exertional shortness of breath and chest tightness that occurs only with more strenuous physical exertion. The patient denies any history of resting shortness of breath or chest discomfort. He denies any history of PND, orthopnea, or lower extremity edema. He has not had dizzy spells or syncope.  Past Medical History  Diagnosis Date  . HYPERLIPIDEMIA 04/11/2009    Qualifier: Diagnosis of  By: Joyce Gross    . CERUMEN IMPACTION 04/11/2009    Qualifier: Diagnosis of  By: Joyce Gross    . INTERNAL HEMORRHOIDS 08/28/2006    Qualifier: Diagnosis of  By: Jerral Ralph    . APHTHOUS ULCERS 12/19/2009    Qualifier: Diagnosis of  By: [name] MD, Bruce    . ANGULAR CHEILITIS 04/11/2009    Qualifier: Diagnosis of  By: Joyce Gross    . GERD 12/02/2008    Qualifier: Diagnosis of  By: [name] MD, Bruce    . DIVERTICULOSIS, COLON 08/28/2006    Qualifier: Diagnosis of  By: Jerral Ralph    . OSTEOPOROSIS 08/21/2007    Qualifier: Diagnosis of  By: Jerral Ralph    . SLEEP APNEA 08/21/2007    Qualifier: Diagnosis of  By: Jerral Ralph    . COLONIC POLYPS, HX OF 12/02/2008    Qualifier: Diagnosis of  By: [name] MD, Bruce    . COLITIS, HX OF 08/21/2007    Qualifier: Diagnosis of  By: Jerral Ralph    .  BENIGN PROSTATIC HYPERTROPHY, HX OF 08/21/2007    Qualifier: Diagnosis of  By: Jerral Ralph      Past Surgical History  Procedure Laterality Date  . Hemorrhoid surgery  2001  . Cardiac catheterization N/A 07/25/2015    Procedure: Right/Left Heart Cath and Coronary Angiography;  Surgeon: Sherren Mocha, MD;   Location: [facility];  Service: Cardiovascular;  Laterality: N/A;    Family History  Problem Relation Age of Onset  . Heart disease Other   . Asthma Mother     Social History   Social History  . Marital Status: Married    Spouse Name: N/A  . Number of Children: N/A  . Years of Education: N/A   Occupational History  . Not on file.   Social History Main Topics  . Smoking status: Never Smoker   . Smokeless tobacco: Not on file  . Alcohol Use: No  . Drug Use: No  . Sexual Activity: Not on file   Other Topics Concern  . Not on file   Social History Narrative    Current Outpatient Prescriptions  Medication Sig Dispense Refill  . aspirin 81 MG tablet Take 81 mg by mouth daily.    . calcium-vitamin D (OSCAL WITH D) 250-125 MG-UNIT tablet Take 2 tablets by mouth 2 (two) times daily.    . Cholecalciferol (VITAMIN D3) 5000 units TABS Take 5,000 Units by mouth daily.    Marland Kitchen doxazosin (CARDURA) 8 MG tablet Take 8 mg by mouth at bedtime.    . finasteride (PROSCAR) 5 MG tablet Take 5 mg by mouth daily.    Marland Kitchen glucosamine-chondroitin 500-400 MG tablet Take 2 tablets by mouth 2 (two) times daily.    . metoprolol succinate (TOPROL-XL) 25 MG 24 hr tablet Take 25 mg by mouth daily.     . Multiple Vitamin (MULTIVITAMIN) tablet Take 1 tablet by mouth daily.    . NON FORMULARY CPAP machine    . Omega-3 Fatty Acids (FISH OIL) 1000 MG CAPS Take 1,000 mg by mouth daily.    Marland Kitchen omeprazole (PRILOSEC) 20 MG capsule TAKE 1 CAPSULE (20 MG TOTAL) BY MOUTH DAILY. 90 capsule 1  . Probiotic Product (PRO-BIOTIC BLEND) CAPS Take 1 capsule by mouth daily as needed (digestive issues).     . tamsulosin (FLOMAX) 0.4 MG CAPS capsule Take 0.4 mg by mouth daily.      No current facility-administered medications for this visit.    Allergies  Allergen Reactions  . Neosporin [Neomycin-Bacitracin Zn-Polymyx] Rash      Review of Systems:   General:  normal appetite, decreased energy, no weight gain, no  weight loss, no fever  Cardiac:  + chest pain with exertion, no chest pain at rest, + SOB with exertion, no resting SOB, no PND, no orthopnea, no palpitations, no arrhythmia, no atrial fibrillation, no LE edema, no dizzy spells, no syncope  Respiratory:  no shortness of breath, no home oxygen, no productive cough, no dry cough, no bronchitis, no wheezing, no hemoptysis, no asthma, no pain with inspiration or cough, + sleep apnea, + CPAP at night  GI:   no difficulty swallowing, + reflux, no frequent heartburn, + hiatal hernia, no abdominal pain, no constipation, no diarrhea, no hematochezia, no hematemesis, no melena  GU:   no dysuria,  no frequency, no urinary tract infection, no hematuria, + enlarged prostate, no kidney stones, no kidney disease  Vascular:  no pain suggestive of claudication, no pain in feet, no leg cramps, no varicose  veins, no DVT, no non-healing foot ulcer  Neuro:   no stroke, no TIA's, no seizures, no headaches, no temporary blindness one eye,  no slurred speech, no peripheral neuropathy, no chronic pain, no instability of gait, no memory/cognitive dysfunction  Musculoskeletal: no arthritis, no joint swelling, no myalgias, no difficulty walking, normal mobility   Skin:   no rash, no itching, no skin infections, no pressure sores or ulcerations  Psych:   no anxiety, no depression, no nervousness, no unusual recent stress  Eyes:   no blurry vision, no floaters, no recent vision changes, + wears glasses or contacts  ENT:   no hearing loss, no loose or painful teeth, no dentures  Hematologic:  + easy bruising, no abnormal bleeding, no clotting disorder, no frequent epistaxis  Endocrine:  no diabetes, does not check CBG's at home           Physical Exam:   BP 124/76 mmHg  Pulse 64  Resp 20  Ht 5\' 2"  (1.575 m)  Wt 142 lb (64.411 kg)  BMI 25.97 kg/m2  SpO2 97%  General:  Thin,  well-appearing, looks younger than stated age  54:  Unremarkable   Neck:   no JVD, no  bruits, no adenopathy   Chest:   clear to auscultation, symmetrical breath sounds, no wheezes, no rhonchi   CV:   RRR, grade III/VI crescendo/decrescendo murmur heard best at RSB,  no diastolic murmur  Abdomen:  soft, non-tender, no masses   Extremities:  warm, well-perfused, pulses palpable, no LE edema  Rectal/GU  Deferred  Neuro:   Grossly non-focal and symmetrical throughout  Skin:   Clean and dry, no rashes, no breakdown   Diagnostic Tests:  TRANSTHORACIC ECHOCARDIOGRAM   Report from transthoracic echocardiogram performed 04/24/2015 at Dr. Thurman Coyer office is reviewed. Images from this echocardiogram are not currently available for direct review. By the report there was severe aortic stenosis with peak velocity across the aortic valve measured 4.06 m/s corresponding to mean transvalvular gradient estimated 44 mmHg.  By report there was moderate concentric left ventricular per to 3 with normal left ventricular systolic function and ejection fraction estimated 55%. By report there was moderate aortic valve leaflet calcification with moderately restricted leaflet mobility. No other significant abnormalities were noted.    CARDIAC CATHETERIZATION Procedures    Right/Left Heart Cath and Coronary Angiography    Conclusion     Mid LAD lesion, 60% stenosed.  1. Single vessel CAD with moderate focal stenosis of the mid-LAD 2. Severely calcified aortic valve with known severe AS 3. Normal right-sided hemodynamics  Recommend: continued evaluation by the Multidisciplinary Heart Valve Team     Indications    Severe aortic stenosis [I35.0 (ICD-10-CM)]    Technique and Indications    INDICATION: Severe aortic stenosis  PROCEDURAL DETAILS: There was an indwelling IV in a right antecubital vein. Using normal sterile technique, the IV was changed out for a 5 Fr brachial sheath over a 0.018 inch wire. The right wrist was then prepped, draped, and anesthetized with 1% lidocaine.  Using the modified Seldinger technique a 5/6 French Slender sheath was placed in the right radial artery. Intra-arterial verapamil was administered through the radial artery sheath. IV heparin was administered after a JR4 catheter was advanced into the central aorta. A Swan-Ganz catheter was used for the right heart catheterization. Standard protocol was followed for recording of right heart pressures and sampling of oxygen saturations. Fick cardiac output was calculated. Standard Judkins catheters were used for  selective coronary angiography and aortic root angiography. There were no immediate procedural complications. The patient was transferred to the post catheterization recovery area for further monitoring.  During this procedure the patient is administered a total of Versed 1 mg and Fentanyl 25 mg to achieve and maintain moderate conscious sedation. The patient's heart rate, blood pressure, and oxygen saturation are monitored continuously during the procedure. The period of conscious sedation is 25 minutes, of which I was present face-to-face 100% of this time.  Estimated blood loss <50 mL. There were no immediate complications during the procedure.    Coronary Findings    Dominance: Right   Left Anterior Descending   . Mid LAD lesion, 60% stenosed. Eccentric, calcified LAD stenosis, moderately stenosed at approximately 60%. No other significant CAD     Right Coronary Artery  . Vessel is normal in caliber. Normal RCA      Left Heart    Aortic Valve There is severe aortic valve stenosis, and no aortic valve regurgitation. The aortic valve is calcified. There is restricted aortic valve motion. Severe aortic stenosis by noninvasive assessment. Heavily calcified aortic valve leaflets with restricted leaflet mobility, no significant AI on aortic root angiography.    Coronary Diagrams    Diagnostic Diagram            Implants     No implant documentation for this case.      PACS Images    Show images for Cardiac catheterization     Link to Procedure Log    Procedure Log      Hemo Data       Most Recent Value   Fick Cardiac Output  5.04 L/min   Fick Cardiac Output Index  3.05 (L/min)/BSA   RA A Wave  8 mmHg   RA V Wave  6 mmHg   RA Mean  5 mmHg   RV Systolic Pressure  35 mmHg   RV Diastolic Pressure  1 mmHg   RV EDP  7 mmHg   PA Systolic Pressure  37 mmHg   PA Diastolic Pressure  15 mmHg   PA Mean  22 mmHg   PW A Wave  15 mmHg   PW V Wave  17 mmHg   PW Mean  12 mmHg   AO Systolic Pressure  123XX123 mmHg   AO Diastolic Pressure  55 mmHg   AO Mean  81 mmHg   QP/QS  1   TPVR Index  7.2 HRUI   TSVR Index  26.5 HRUI   PVR SVR Ratio  0.14   TPVR/TSVR Ratio  0.27      CT ANGIOGRAPHY CHEST, ABDOMEN AND PELVIS  TECHNIQUE: Multidetector CT imaging through the chest, abdomen and pelvis was performed using the standard protocol during bolus administration of intravenous contrast. Multiplanar reconstructed images and MIPs were obtained and reviewed to evaluate the vascular anatomy.  CONTRAST: 75 mL of Isovue 370.  COMPARISON: Chest CT 05/02/2008.  FINDINGS: CTA CHEST FINDINGS  Mediastinum/Lymph Nodes: Heart size is normal. There is no significant pericardial fluid, thickening or pericardial calcification. There is atherosclerosis of the thoracic aorta, the great vessels of the mediastinum and the coronary arteries, including calcified atherosclerotic plaque in the left anterior descending coronary artery. Severe calcifications and thickening of the aortic valve. No pathologically enlarged mediastinal or hilar lymph nodes. Densely calcified right hilar lymph node incidentally noted. Small hiatal hernia. No axillary lymphadenopathy.  Lungs/Pleura: 7 x 5 mm (mean diameter 6 mm) pleural-based nodule in the  inferior segment of the lingula is unchanged compared to prior study 05/02/2008, presumably a focal area  of scarring. A few other scattered areas of peribronchovascular ground-glass attenuation and micronodularity are noted in the lungs bilaterally, particularly in the left upper lobe. In addition, there is a 13 x 9 mm (mean diameter of 11 mm) nodule in the left upper lobe (image 20 of series 407) which has slightly ill-defined margins, a large ground-glass attenuation component, as well as a large solid appearing component (solid component measures up to 12 mm in length). No acute consolidative airspace disease. No pleural effusions.  Musculoskeletal/Soft Tissues: There are no aggressive appearing lytic or blastic lesions noted in the visualized portions of the skeleton.  CTA ABDOMEN AND PELVIS FINDINGS  Hepatobiliary: No cystic or solid hepatic lesions. No intra or extrahepatic biliary ductal dilatation. Gallbladder is unremarkable in appearance.  Pancreas: No pancreatic mass. No pancreatic ductal dilatation. No pancreatic or peripancreatic fluid or inflammatory changes.  Spleen: Unremarkable.  Adrenals/Urinary Tract: 16 mm exophytic simple cyst in the lower pole of the right kidney. Multiple additional sub cm low-attenuation lesions are noted throughout the kidneys bilaterally, too small to definitively characterize, but favored to represent tiny cysts. Bilateral adrenal glands are normal in appearance. No hydroureteronephrosis. Urinary bladder is mildly trabeculated, but otherwise unremarkable in appearance.  Stomach/Bowel: Normal appearance of the stomach. No pathologic dilatation of small bowel or colon. Normal appendix.  Vascular/Lymphatic: Atherosclerosis throughout the abdominal and pelvic vasculature, with vascular findings and measurements pertinent to potential TAVR procedure, as detailed below. No aneurysm or dissection noted. Single renal arteries bilaterally appear widely patent. Celiac axis, superior mesenteric artery and inferior mesenteric artery are  all patent without definite hemodynamically significant stenosis. No lymphadenopathy noted in the abdomen or pelvis.  Reproductive: Median lobe hypertrophy in the prostate gland which is heterogeneously calcified. Seminal vesicles are unremarkable in appearance.  Other: No significant volume of ascites. No pneumoperitoneum.  Musculoskeletal: There are no aggressive appearing lytic or blastic lesions noted in the visualized portions of the skeleton.  VASCULAR MEASUREMENTS PERTINENT TO TAVR:  AORTA:  Minimal Aortic Diameter - 15 x 16 mm  Severity of Aortic Calcification - severe  RIGHT PELVIS:  Right Common Iliac Artery -  Minimal Diameter - 10.7 x 10.6 mm  Tortuosity - severe  Calcification - mild  Right External Iliac Artery -  Minimal Diameter - 9.4 x 9.2 mm  Tortuosity - moderate  Calcification - minimal  Right Common Femoral Artery -  Minimal Diameter - 8.2 x 9.5 mm  Tortuosity - mild-to-moderate  Calcification - mild  LEFT PELVIS:  Left Common Iliac Artery -  Minimal Diameter - 11.9 x 7.1 mm  Tortuosity - severe  Calcification - mild  Left External Iliac Artery -  Minimal Diameter - 8.4 x 8.2 mm  Tortuosity - moderate  Calcification - minimal  Left Common Femoral Artery -  Minimal Diameter - 8.4 x 6.9 mm  Tortuosity - mild  Calcification - mild  Review of the MIP images confirms the above findings.  IMPRESSION: 1. Vascular findings and measurements pertinent to potential TAVR procedure, as detailed above. This patient does appear to have suitable pelvic arterial access in terms of vascular size, although there is extensive tortuosity, as discussed above. 2. Severe thickening calcification of the aortic valve, compatible with the reported clinical history of severe aortic stenosis. 3. Multiple areas of nodularity in the lungs bilaterally, most of which are nonspecific and favored to be benign.  The one  potentially concerning lesion is a 9 x 13 mm nodule (mean diameter of 11 mm) in the left upper lobe (image 20 of series 407) which has both ground-glass attenuation and solid components. Follow-up non-contrast CT recommended at 3 months to confirm persistence. If persistent these nodules should be considered highly suspicious if the solid component of the nodule is 6 mm or greater in size and enlarging. This recommendation follows the consensus statement: Guidelines for Management of Incidental Pulmonary Nodules Detected on CT Images:From the Fleischner Society 2017; published online before print (10.1148/radiol.IJ:2314499). 4. Additional incidental findings, as above.   Electronically Signed  By: Vinnie Langton M.D.  On: 08/01/2015 11:35     Impression:  Patient has stage D severe symptomatic aortic stenosis. He presents with progressive symptoms of exertional shortness of breath and fatigue consistent with chronic diastolic congestive heart failure, New York Heart Association functional class II. Recent transthoracic echocardiogram is reported to demonstrate severe aortic stenosis with moderately restricted leaflet mobility, moderate leaflet calcification, and peak velocity across the aortic valve measured greater than 4 m/s. Left ventricular systolic function remains preserved. Diagnostic cardiac catheterization is notable for the presence of 60% stenosis of the mid left anterior descending coronary artery and otherwise mild nonobstructive disease.  Risks associated with conventional surgical aortic valve replacement would be moderately elevated because of the patient's advanced age. Cardiac gated CT angiogram of the heart has not yet been officially read but I have personally reviewed images from this study.  Findings are consistent with severe aortic stenosis. There do not appear to be any complicating features that would alter plans for transcatheter aortic valve  replacement. CT angiogram of the abdomen and pelvis demonstrates adequate pelvic vascular access for transfemoral approach. I agree that transcatheter aortic valve replacement would likely be a better treatment option than conventional surgery for this octogenarian.   Plan:  The patient and his wife were counseled at length regarding treatment alternatives for management of severe symptomatic aortic stenosis. Alternative approaches such as conventional aortic valve replacement, transcatheter aortic valve replacement, and palliative medical therapy were compared and contrasted at length.  The risks associated with conventional surgical aortic valve replacement were been discussed in detail, as were expectations for post-operative convalescence. Long-term prognosis with medical therapy was discussed. This discussion was placed in the context of the patient's own specific clinical presentation and past medical history.  All of their questions been addressed.  The patient is interested in proceeding with transcatheter aortic valve replacement as soon as practical.  Following the decision to proceed with transcatheter aortic valve replacement, a discussion has been held regarding what types of management strategies would be attempted intraoperatively in the event of life-threatening complications, including whether or not the patient would be considered a candidate for the use of cardiopulmonary bypass and/or conversion to open sternotomy for attempted surgical intervention.  The patient has been advised of a variety of complications that might develop including but not limited to risks of death, stroke, paravalvular leak, aortic dissection or other major vascular complications, aortic annulus rupture, device embolization, cardiac rupture or perforation, mitral regurgitation, acute myocardial infarction, arrhythmia, heart block or bradycardia requiring permanent pacemaker placement, congestive heart failure,  respiratory failure, renal failure, pneumonia, infection, other late complications related to structural valve deterioration or migration, or other complications that might ultimately cause a temporary or permanent loss of functional independence or other long term morbidity.  The patient provides full informed consent for the procedure as described and all questions were answered.  Valentina Gu. Roxy Manns, MD 08/01/2015 5:54 PM

## 2015-08-01 NOTE — Therapy (Signed)
[place], [place], [address] Phone: [phone]   Fax:  [phone]  Physical Therapy Evaluation  Patient Details  Name: James Hardy MRN: [id number] Date of Birth: [birth year] Referring Provider: Dr. Sherren Mocha  Encounter Date: 08/01/2015      PT End of Session - 08/01/15 1352    Visit Number 1   PT Start Time 1009   PT Stop Time 1052   PT Time Calculation (min) 43 min   Behavior During Therapy [facility] for tasks assessed/performed      Past Medical History  Diagnosis Date  . HYPERLIPIDEMIA 04/11/2009    Qualifier: Diagnosis of  By: Joyce Gross    . CERUMEN IMPACTION 04/11/2009    Qualifier: Diagnosis of  By: Joyce Gross    . INTERNAL HEMORRHOIDS 08/28/2006    Qualifier: Diagnosis of  By: Jerral Ralph    . APHTHOUS ULCERS 12/19/2009    Qualifier: Diagnosis of  By: [name] MD, Bruce    . ANGULAR CHEILITIS 04/11/2009    Qualifier: Diagnosis of  By: Joyce Gross    . GERD 12/02/2008    Qualifier: Diagnosis of  By: [name] MD, Bruce    . DIVERTICULOSIS, COLON 08/28/2006    Qualifier: Diagnosis of  By: Jerral Ralph    . OSTEOPOROSIS 08/21/2007    Qualifier: Diagnosis of  By: Jerral Ralph    . SLEEP APNEA 08/21/2007    Qualifier: Diagnosis of  By: Jerral Ralph    . COLONIC POLYPS, HX OF 12/02/2008    Qualifier: Diagnosis of  By: [name] MD, Bruce    . COLITIS, HX OF 08/21/2007    Qualifier: Diagnosis of  By: Jerral Ralph    . BENIGN PROSTATIC HYPERTROPHY, HX OF 08/21/2007    Qualifier: Diagnosis of  By: Jerral Ralph      Past Surgical History  Procedure Laterality Date  . Hemorrhoid surgery  2001  . Cardiac catheterization N/A 07/25/2015    Procedure: Right/Left Heart Cath and Coronary Angiography;  Surgeon: Sherren Mocha, MD;  Location: [facility];  Service: Cardiovascular;  Laterality: N/A;    There were no vitals filed for  this visit.       Subjective Assessment - 08/01/15 1013    Subjective Pt reports onset of exertional fatigue, SOB, and chest discomfort over the past year which has been gradually worsening and has begun limiting his ability to be as active in the yard as he would like including gardening and operating the chainsaw. He also has noticed increasing SOB with the stairs in his house.    Patient Stated Goals continue to be active in the yard   Currently in Pain? No/denies            [facility] PT Assessment - 08/01/15 0001    Assessment   Medical Diagnosis severe aortic stenosis   Referring Provider Dr. Sherren Mocha   Onset Date/Surgical Date 07/21/15   Precautions   Precautions None   Restrictions   Weight Bearing Restrictions No   Balance Screen   Has the patient fallen in the past 6 months No   Has the patient had a decrease in activity level because of a fear of falling?  No   Is the patient reluctant to leave their home because of a fear of falling?  No   Home Environment   Living Environment Private residence   Living Arrangements Spouse/significant other   Home Access Stairs to enter   Entrance  Stairs-Number of Steps 3-4   Entrance Stairs-Rails Right;Left;Can reach both   Home Layout Two level;Laundry or work area in basement   Prior Function   Level of Independence Independent with community mobility without device  yardowrk, garnding   Posture/Postural Control   Posture/Postural Control Postural limitations   Postural Limitations Rounded Shoulders;Forward head  mild   ROM / Strength   AROM / PROM / Strength AROM;Strength   AROM   Overall AROM Comments grossly WNL   Strength   Overall Strength Comments grossly 5/5   Strength Assessment Site Hand   Right/Left hand Right;Left   Right Hand Grip (lbs) 66  R hand dominant   Left Hand Grip (lbs) 68   Ambulation/Gait   Gait Comments Pt ambulates without any significant deviations however pt does demonstrate unsteadiness  with 180 degree turns.    6 Minute Walk- Baseline   6 Minute Walk- Baseline yes   BP (mmHg) 142/62 mmHg   HR (bpm) 64   02 Sat (%RA) 99 %   Modified Borg Scale for Dyspnea 0- Nothing at all   Perceived Rate of Exertion (Borg) 6-   6 Minute walk- Post Test   6 Minute Walk Post Test yes          [facility] Pre-Surgical Assessment - 08/01/15 0001    5 Meter Walk Test- trial 1 4.9 sec   5 Meter Walk Test- trial 2 6 sec.    5 Meter Walk Test- trial 3 4.6 sec.  </= 6 sec WNL   5 meter walk test average 5.17 sec   Timed Up & Go Test trial  9.9 sec.   Comments <12 seconds not indicative of high fall risk and < 10 sec considered WNL for normal ederly community ambulators   4 Stage Balance Test tolerated for:  10 sec.   4 Stage Balance Test Position 2   Sit To Stand Test- trial 1 13.7 sec.   Comment </= 14.8 WNL   ADL/IADL Independent with: Bathing;Dressing;Meal prep;Finances;Yard work   BP (mmHg) 152/70 mmHg   HR (bpm) 82   02 Sat (%RA) 97 %   Modified Borg Scale for Dyspnea 3- Moderate shortness of breath or breathing difficulty   Perceived Rate of Exertion (Borg) 11- Fairly light   Aerobic Endurance Distance Walked 973   Endurance additional comments Pt ambulated without any rest breaks. He did slow his pace around 3:00 due to onset of SOB. At conclusion, pt reported 5/10 chest discomfort which resolved with seated rest after 2:30.                          PT Education - 08/01/15 1352    Education provided Yes   Education Details increased fall risk   Person(s) Educated Patient;Spouse;Child(ren)   Methods Explanation   Comprehension Verbalized understanding                    Plan - 08/01/15 1352    Clinical Impression Statement Pt is an 82 yo male presenting for OP PT eval prior to possible TAVR surgery due to severe aortic stenosis. Pt's wife and son are present for evaluation. Pt/family report onset of exertional fatigue, SOB, and chest discomfort  over the past year which has been gradually worsening and has begun limiting his ability to be as active in the yard as he would like including gardening and operating the chainsaw. He also has noticed increasing SOB with the  stairs in his house. Pt presents with good ROM and strength, mild imbalance and an increased risk for falls per 4 stage balance test. Pt's gait speed is WNL. Pt has reduced aerobic endurance with 6 minute walk test. Pt ambulated 973' without any rest breaks. He did slow his pace around 3:00 due to onset of SOB. At conclusion, pt reported 5/10 chest discomfort which resolved with seated rest after 2:30.    PT Frequency One time visit   Consulted and Agree with Plan of Care Patient;Family member/caregiver      Patient demonstrated the following deficits and impairments:     Visit Diagnosis: Unsteadiness on feet - Plan: PT plan of care cert/re-cert  Abnormal posture - Plan: PT plan of care cert/re-cert      G-Codes - [id number]    Functional Assessment Tool Used 6 minute walk 973'   Functional Limitation Mobility: Walking and moving around   Mobility: Walking and Moving Around Current Status [phone]) At least 20 percent but less than 40 percent impaired, limited or restricted   Mobility: Walking and Moving Around Goal Status [phone]) At least 20 percent but less than 40 percent impaired, limited or restricted   Mobility: Walking and Moving Around Discharge Status [phone]) At least 20 percent but less than 40 percent impaired, limited or restricted       Problem List Patient Active Problem List   Diagnosis Date Noted  . Severe aortic stenosis   . Aortic stenosis   . Angina pectoris ([place])   . Hyperlipidemia 04/11/2009  . GERD 12/02/2008  . Sleep apnea 08/21/2007  . BPH (benign prostatic hypertrophy) 08/21/2007    NICOLETTA,DANA, PT 08/01/2015, 1:58 PM  [facility] [address] [place], [place],  [address] Phone: [phone]   Fax:  [phone]  Name: ANTHOY MCELHANNON MRN: [id number] Date of Birth: October 16, 1928

## 2015-08-01 NOTE — Patient Instructions (Signed)
   Patient should continue taking all current medications without change through the day before surgery.  Patient should have nothing to eat or drink after midnight the night before surgery.  On the morning of surgery patient should take only Toprol XL with a sip of water.

## 2015-08-03 NOTE — Pre-Procedure Instructions (Signed)
    James Hardy  08/03/2015      [name] [name] [place] [address] Phone: [phone] Fax: [phone]  [name] [place], [place] - [address] [place] [address] Phone: [phone] Fax: [phone]    Your procedure is scheduled on Tuesday, May 16th, 2017.  Report to [facility] Admitting at 5:30 A.M.   Call this number if you have problems the morning of surgery:  [phone]   Remember:  Do not eat food or drink liquids after midnight.  Take these medicines the morning of surgery with A SIP OF WATER: Finasteride (Proscar), Omeprazole (Prilsec), Tamsulosin (Flomax).  Stop taking: Aspirin, NSAIDS, Aleve, Naproxen, Ibuprofen, Advil, Motrin, BC's, Goody's, Fish oil, all herbal medications, and all vitamins.    Do not wear jewelry.  Do not wear lotions, powders, or colognes.  You may NOT wear deodorant.  Men may shave face and neck.  Do not bring valuables to the hospital.   [facility] is not responsible for any belongings or valuables.  Contacts, dentures or bridgework may not be worn into surgery.  Leave your suitcase in the car.  After surgery it may be brought to your room.  For patients admitted to the hospital, discharge time will be determined by your treatment team.  Patients discharged the day of surgery will not be allowed to drive home.   Special instructions:  See attached.   Please read over the following fact sheets that you were given. Pain Booklet, Coughing and Deep Breathing, Blood Transfusion Information, MRSA Information and Surgical Site Infection Prevention

## 2015-08-04 ENCOUNTER — Encounter: Payer: Medicare Other | Admitting: Thoracic Surgery (Cardiothoracic Vascular Surgery)

## 2015-08-04 ENCOUNTER — Ambulatory Visit (HOSPITAL_COMMUNITY)
Admission: RE | Admit: 2015-08-04 | Discharge: 2015-08-04 | Disposition: A | Discharge status: Home / Self Care | Payer: Medicare Other | Source: Ambulatory Visit | Attending: Cardiovascular Disease | Admitting: Cardiovascular Disease

## 2015-08-04 ENCOUNTER — Encounter (HOSPITAL_COMMUNITY)
Admission: RE | Admit: 2015-08-04 | Discharge: 2015-08-04 | Disposition: A | Discharge status: Home / Self Care | Payer: Medicare Other | Source: Ambulatory Visit | Attending: Cardiovascular Disease | Admitting: Cardiovascular Disease

## 2015-08-04 ENCOUNTER — Encounter (HOSPITAL_COMMUNITY): Payer: Self-pay

## 2015-08-04 VITALS — BP 140/62 | HR 56 | Temp 97.8°F | Resp 20 | Ht 62.0 in | Wt 141.0 lb

## 2015-08-04 DIAGNOSIS — J9811 Atelectasis: Secondary | ICD-10-CM | POA: Diagnosis not present

## 2015-08-04 DIAGNOSIS — Z7982 Long term (current) use of aspirin: Secondary | ICD-10-CM | POA: Insufficient documentation

## 2015-08-04 DIAGNOSIS — Z01812 Encounter for preprocedural laboratory examination: Secondary | ICD-10-CM | POA: Insufficient documentation

## 2015-08-04 DIAGNOSIS — I350 Nonrheumatic aortic (valve) stenosis: Secondary | ICD-10-CM

## 2015-08-04 DIAGNOSIS — E785 Hyperlipidemia, unspecified: Secondary | ICD-10-CM | POA: Diagnosis not present

## 2015-08-04 DIAGNOSIS — K219 Gastro-esophageal reflux disease without esophagitis: Secondary | ICD-10-CM | POA: Diagnosis not present

## 2015-08-04 DIAGNOSIS — G4730 Sleep apnea, unspecified: Secondary | ICD-10-CM | POA: Diagnosis not present

## 2015-08-04 DIAGNOSIS — Z01818 Encounter for other preprocedural examination: Secondary | ICD-10-CM | POA: Insufficient documentation

## 2015-08-04 DIAGNOSIS — N400 Enlarged prostate without lower urinary tract symptoms: Secondary | ICD-10-CM | POA: Diagnosis not present

## 2015-08-04 DIAGNOSIS — Z79899 Other long term (current) drug therapy: Secondary | ICD-10-CM | POA: Insufficient documentation

## 2015-08-04 DIAGNOSIS — J439 Emphysema, unspecified: Secondary | ICD-10-CM | POA: Diagnosis not present

## 2015-08-04 HISTORY — DX: Reserved for inherently not codable concepts without codable children: IMO0001

## 2015-08-04 HISTORY — DX: Nocturia: R35.1

## 2015-08-04 LAB — BLOOD GAS, ARTERIAL
Acid-base deficit: 2.7 mmol/L — ABNORMAL HIGH (ref 0.0–2.0)
Bicarbonate: 21.3 mEq/L (ref 20.0–24.0)
DRAWN BY: 4218011
FIO2: 0.21
O2 SAT: 97.3 %
PATIENT TEMPERATURE: 98.6
TCO2: 22.4 mmol/L (ref 0–100)
pCO2 arterial: 35.2 mmHg (ref 35.0–45.0)
pH, Arterial: 7.399 (ref 7.350–7.450)
pO2, Arterial: 94.3 mmHg (ref 80.0–100.0)

## 2015-08-04 LAB — COMPREHENSIVE METABOLIC PANEL
ALBUMIN: 3.6 g/dL (ref 3.5–5.0)
ALK PHOS: 41 U/L (ref 38–126)
ALT: 16 U/L — ABNORMAL LOW (ref 17–63)
AST: 24 U/L (ref 15–41)
Anion gap: 9 (ref 5–15)
BILIRUBIN TOTAL: 0.6 mg/dL (ref 0.3–1.2)
BUN: 29 mg/dL — AB (ref 6–20)
CO2: 20 mmol/L — AB (ref 22–32)
Calcium: 9 mg/dL (ref 8.9–10.3)
Chloride: 110 mmol/L (ref 101–111)
Creatinine, Ser: 1.3 mg/dL — ABNORMAL HIGH (ref 0.61–1.24)
GFR calc Af Amer: 56 mL/min — ABNORMAL LOW (ref >60)
GFR calc non Af Amer: 49 mL/min — ABNORMAL LOW (ref >60)
GLUCOSE: 93 mg/dL (ref 65–99)
POTASSIUM: 4.3 mmol/L (ref 3.5–5.1)
SODIUM: 139 mmol/L (ref 135–145)
TOTAL PROTEIN: 6.2 g/dL — AB (ref 6.5–8.1)

## 2015-08-04 LAB — CBC
HEMATOCRIT: 31.6 % — AB (ref 39.0–52.0)
HEMOGLOBIN: 10.6 g/dL — AB (ref 13.0–17.0)
MCH: 34.3 pg — ABNORMAL HIGH (ref 26.0–34.0)
MCHC: 33.5 g/dL (ref 30.0–36.0)
MCV: 102.3 fL — ABNORMAL HIGH (ref 78.0–100.0)
Platelets: 204 10*3/uL (ref 150–400)
RBC: 3.09 MIL/uL — ABNORMAL LOW (ref 4.22–5.81)
RDW: 15.7 % — AB (ref 11.5–15.5)
WBC: 7.1 10*3/uL (ref 4.0–10.5)

## 2015-08-04 LAB — PROTIME-INR
INR: 1.18 (ref 0.00–1.49)
Prothrombin Time: 15.2 seconds (ref 11.6–15.2)

## 2015-08-04 LAB — URINALYSIS, ROUTINE W REFLEX MICROSCOPIC
BILIRUBIN URINE: NEGATIVE
Glucose, UA: NEGATIVE mg/dL
Hgb urine dipstick: NEGATIVE
Ketones, ur: NEGATIVE mg/dL
Leukocytes, UA: NEGATIVE
NITRITE: NEGATIVE
PH: 6 (ref 5.0–8.0)
Protein, ur: NEGATIVE mg/dL
SPECIFIC GRAVITY, URINE: 1.024 (ref 1.005–1.030)

## 2015-08-04 LAB — ABO/RH: ABO/RH(D): A POS

## 2015-08-04 LAB — SURGICAL PCR SCREEN
MRSA, PCR: NEGATIVE
STAPHYLOCOCCUS AUREUS: POSITIVE — AB

## 2015-08-04 LAB — APTT: aPTT: 34 seconds (ref 24–37)

## 2015-08-04 NOTE — Progress Notes (Signed)
I [name] [name] [place]. 

## 2015-08-04 NOTE — Progress Notes (Signed)
PCP - [name] Cardiologist - Dr. Wynonia Lawman  EKG - 07/25/15 CXR - 08/04/15  Echo - 03/2015 Stress test - 2014 Cath - 07/2015  Patient denies chest pain and acute shortness of breath at PAT appointment.  Sleep study requested from Dr. [name].    Patient states he takes his Metoprolol at bedtime.

## 2015-08-05 LAB — HEMOGLOBIN A1C
HEMOGLOBIN A1C: 5.3 % (ref 4.8–5.6)
MEAN PLASMA GLUCOSE: 105 mg/dL

## 2015-08-07 MED ORDER — EPINEPHRINE HCL 1 MG/ML IJ SOLN
0.0000 ug/min | INTRAVENOUS | Status: DC
  Filled 2015-08-07: qty 4

## 2015-08-07 MED ORDER — MAGNESIUM SULFATE 50 % IJ SOLN
40.0000 meq | INTRAMUSCULAR | Status: DC
  Filled 2015-08-07: qty 10

## 2015-08-07 MED ORDER — SODIUM CHLORIDE 0.9 % IV SOLN
INTRAVENOUS | Status: DC
  Filled 2015-08-07: qty 30

## 2015-08-07 MED ORDER — VANCOMYCIN HCL 10 G IV SOLR
1250.0000 mg | INTRAVENOUS | Status: AC
  Administered 2015-08-08: 1250 mg via INTRAVENOUS
  Filled 2015-08-07: qty 1250

## 2015-08-07 MED ORDER — DOPAMINE-DEXTROSE 3.2-5 MG/ML-% IV SOLN
0.0000 ug/kg/min | INTRAVENOUS | Status: DC
  Filled 2015-08-07: qty 250

## 2015-08-07 MED ORDER — POTASSIUM CHLORIDE 2 MEQ/ML IV SOLN
80.0000 meq | INTRAVENOUS | Status: DC
  Filled 2015-08-07: qty 40

## 2015-08-07 MED ORDER — NOREPINEPHRINE BITARTRATE 1 MG/ML IV SOLN
0.0000 ug/min | INTRAVENOUS | Status: DC
  Filled 2015-08-07: qty 4

## 2015-08-07 MED ORDER — DEXMEDETOMIDINE HCL IN NACL 400 MCG/100ML IV SOLN
0.1000 ug/kg/h | INTRAVENOUS | Status: AC
  Administered 2015-08-08: .3 ug/kg/h via INTRAVENOUS
  Filled 2015-08-07: qty 100

## 2015-08-07 MED ORDER — DEXTROSE 5 % IV SOLN
1.5000 g | INTRAVENOUS | Status: AC
  Administered 2015-08-08: 1.5 g via INTRAVENOUS
  Filled 2015-08-07: qty 1.5

## 2015-08-07 MED ORDER — INSULIN REGULAR HUMAN 100 UNIT/ML IJ SOLN
INTRAMUSCULAR | Status: DC
  Filled 2015-08-07: qty 2.5

## 2015-08-07 MED ORDER — DEXTROSE 5 % IV SOLN
30.0000 ug/min | INTRAVENOUS | Status: DC
  Filled 2015-08-07: qty 2

## 2015-08-07 MED ORDER — SODIUM CHLORIDE 0.9 % IV SOLN: INTRAVENOUS | Status: DC

## 2015-08-07 MED ORDER — NITROGLYCERIN IN D5W 200-5 MCG/ML-% IV SOLN
2.0000 ug/min | INTRAVENOUS | Status: DC
  Filled 2015-08-07: qty 250

## 2015-08-07 NOTE — Anesthesia Preprocedure Evaluation (Addendum)
Anesthesia Evaluation  Patient identified by MRN, date of birth, ID band Patient awake    Reviewed: Allergy & Precautions, NPO status , Patient's Chart, lab work & pertinent test results, reviewed documented beta blocker date and time   Airway Mallampati: I  TM Distance: >3 FB Neck ROM: Full    Dental  (+) Teeth Intact   Pulmonary sleep apnea ,    breath sounds clear to auscultation       Cardiovascular hypertension, Pt. on home beta blockers and Pt. on medications + angina + Valvular Problems/Murmurs AS  Rhythm:Regular Rate:Normal     Neuro/Psych negative neurological ROS  negative psych ROS   GI/Hepatic Neg liver ROS, GERD  Medicated,  Endo/Other  negative endocrine ROS  Renal/GU negative Renal ROS  negative genitourinary   Musculoskeletal negative musculoskeletal ROS (+)   Abdominal Normal abdominal exam  (+)   Peds negative pediatric ROS (+)  Hematology negative hematology ROS (+)   Anesthesia Other Findings   Reproductive/Obstetrics negative OB ROS                            Anesthesia Physical Anesthesia Plan  ASA: IV  Anesthesia Plan: General   Post-op Pain Management:    Induction: Intravenous  Airway Management Planned: Oral ETT  Additional Equipment: Arterial line, CVP, PA Cath and TEE  Intra-op Plan:   Post-operative Plan: Extubation in OR  Informed Consent: I have reviewed the patients History and Physical, chart, labs and discussed the procedure including the risks, benefits and alternatives for the proposed anesthesia with the patient or authorized representative who has indicated his/her understanding and acceptance.   Dental advisory given  Plan Discussed with: CRNA  Anesthesia Plan Comments:         Anesthesia Quick Evaluation

## 2015-08-08 ENCOUNTER — Encounter (HOSPITAL_COMMUNITY)
Admission: RE | Disposition: A | Discharge status: Home / Self Care | Payer: Self-pay | Source: Ambulatory Visit | Attending: Cardiovascular Disease

## 2015-08-08 ENCOUNTER — Encounter (HOSPITAL_COMMUNITY): Payer: Self-pay | Admitting: *Deleted

## 2015-08-08 ENCOUNTER — Inpatient Hospital Stay (HOSPITAL_COMMUNITY): Payer: Medicare Other

## 2015-08-08 ENCOUNTER — Ambulatory Visit (HOSPITAL_COMMUNITY): Payer: Medicare Other | Admitting: Anesthesiology

## 2015-08-08 ENCOUNTER — Inpatient Hospital Stay (HOSPITAL_COMMUNITY)
Admission: RE | Admit: 2015-08-08 | Discharge: 2015-08-10 | DRG: 266 | Disposition: A | Discharge status: Home / Self Care | Payer: Medicare Other | Source: Ambulatory Visit | Attending: Cardiovascular Disease | Admitting: Cardiovascular Disease

## 2015-08-08 ENCOUNTER — Ambulatory Visit (HOSPITAL_BASED_OUTPATIENT_CLINIC_OR_DEPARTMENT_OTHER): Payer: Medicare Other

## 2015-08-08 ENCOUNTER — Other Ambulatory Visit: Payer: Self-pay

## 2015-08-08 DIAGNOSIS — I110 Hypertensive heart disease with heart failure: Secondary | ICD-10-CM | POA: Diagnosis present

## 2015-08-08 DIAGNOSIS — E785 Hyperlipidemia, unspecified: Secondary | ICD-10-CM | POA: Diagnosis present

## 2015-08-08 DIAGNOSIS — I350 Nonrheumatic aortic (valve) stenosis: Principal | ICD-10-CM | POA: Diagnosis present

## 2015-08-08 DIAGNOSIS — K219 Gastro-esophageal reflux disease without esophagitis: Secondary | ICD-10-CM | POA: Diagnosis present

## 2015-08-08 DIAGNOSIS — Z881 Allergy status to other antibiotic agents status: Secondary | ICD-10-CM

## 2015-08-08 DIAGNOSIS — I5033 Acute on chronic diastolic (congestive) heart failure: Secondary | ICD-10-CM | POA: Diagnosis not present

## 2015-08-08 DIAGNOSIS — I493 Ventricular premature depolarization: Secondary | ICD-10-CM | POA: Diagnosis not present

## 2015-08-08 DIAGNOSIS — G4730 Sleep apnea, unspecified: Secondary | ICD-10-CM | POA: Diagnosis present

## 2015-08-08 DIAGNOSIS — Z7982 Long term (current) use of aspirin: Secondary | ICD-10-CM

## 2015-08-08 DIAGNOSIS — I440 Atrioventricular block, first degree: Secondary | ICD-10-CM | POA: Diagnosis present

## 2015-08-08 DIAGNOSIS — I447 Left bundle-branch block, unspecified: Secondary | ICD-10-CM | POA: Diagnosis not present

## 2015-08-08 DIAGNOSIS — Z006 Encounter for examination for normal comparison and control in clinical research program: Secondary | ICD-10-CM | POA: Diagnosis not present

## 2015-08-08 DIAGNOSIS — I2510 Atherosclerotic heart disease of native coronary artery without angina pectoris: Secondary | ICD-10-CM | POA: Diagnosis present

## 2015-08-08 DIAGNOSIS — Z952 Presence of prosthetic heart valve: Secondary | ICD-10-CM

## 2015-08-08 DIAGNOSIS — I340 Nonrheumatic mitral (valve) insufficiency: Secondary | ICD-10-CM | POA: Diagnosis not present

## 2015-08-08 DIAGNOSIS — Z452 Encounter for adjustment and management of vascular access device: Secondary | ICD-10-CM | POA: Diagnosis not present

## 2015-08-08 DIAGNOSIS — J90 Pleural effusion, not elsewhere classified: Secondary | ICD-10-CM | POA: Diagnosis not present

## 2015-08-08 HISTORY — PX: TEE WITHOUT CARDIOVERSION: SHX5443

## 2015-08-08 HISTORY — PX: TRANSCATHETER AORTIC VALVE REPLACEMENT, TRANSFEMORAL: SHX6400

## 2015-08-08 LAB — POCT I-STAT 3, ART BLOOD GAS (G3+)
ACID-BASE DEFICIT: 2 mmol/L (ref 0.0–2.0)
ACID-BASE DEFICIT: 3 mmol/L — AB (ref 0.0–2.0)
BICARBONATE: 23.7 meq/L (ref 20.0–24.0)
BICARBONATE: 22.6 meq/L (ref 20.0–24.0)
O2 Saturation: 100 %
O2 Saturation: 98 %
PH ART: 7.331 — AB (ref 7.350–7.450)
PH ART: 7.351 (ref 7.350–7.450)
PO2 ART: 359 mmHg — AB (ref 80.0–100.0)
TCO2: 25 mmol/L (ref 0–100)
TCO2: 24 mmol/L (ref 0–100)
pCO2 arterial: 44.8 mmHg (ref 35.0–45.0)
pCO2 arterial: 40.9 mmHg (ref 35.0–45.0)
pO2, Arterial: 110 mmHg — ABNORMAL HIGH (ref 80.0–100.0)

## 2015-08-08 LAB — CREATININE, SERUM: Creatinine, Ser: 1.08 mg/dL (ref 0.61–1.24)

## 2015-08-08 LAB — POCT I-STAT, CHEM 8
BUN: 28 mg/dL — AB (ref 6–20)
BUN: 26 mg/dL — AB (ref 6–20)
BUN: 26 mg/dL — ABNORMAL HIGH (ref 6–20)
BUN: 21 mg/dL — AB (ref 6–20)
CALCIUM ION: 1.22 mmol/L (ref 1.13–1.30)
CALCIUM ION: 1.16 mmol/L (ref 1.13–1.30)
CHLORIDE: 106 mmol/L (ref 101–111)
CHLORIDE: 106 mmol/L (ref 101–111)
CHLORIDE: 106 mmol/L (ref 101–111)
CREATININE: 1 mg/dL (ref 0.61–1.24)
CREATININE: 0.9 mg/dL (ref 0.61–1.24)
CREATININE: 1 mg/dL (ref 0.61–1.24)
CREATININE: 1 mg/dL (ref 0.61–1.24)
Calcium, Ion: 1.16 mmol/L (ref 1.13–1.30)
Calcium, Ion: 1.17 mmol/L (ref 1.13–1.30)
Chloride: 106 mmol/L (ref 101–111)
GLUCOSE: 110 mg/dL — AB (ref 65–99)
GLUCOSE: 123 mg/dL — AB (ref 65–99)
GLUCOSE: 89 mg/dL (ref 65–99)
Glucose, Bld: 118 mg/dL — ABNORMAL HIGH (ref 65–99)
HCT: 28 % — ABNORMAL LOW (ref 39.0–52.0)
HCT: 25 % — ABNORMAL LOW (ref 39.0–52.0)
HCT: 26 % — ABNORMAL LOW (ref 39.0–52.0)
HEMATOCRIT: 25 % — AB (ref 39.0–52.0)
HEMOGLOBIN: 8.5 g/dL — AB (ref 13.0–17.0)
HEMOGLOBIN: 8.5 g/dL — AB (ref 13.0–17.0)
Hemoglobin: 9.5 g/dL — ABNORMAL LOW (ref 13.0–17.0)
Hemoglobin: 8.8 g/dL — ABNORMAL LOW (ref 13.0–17.0)
POTASSIUM: 4.1 mmol/L (ref 3.5–5.1)
Potassium: 3.9 mmol/L (ref 3.5–5.1)
Potassium: 3.9 mmol/L (ref 3.5–5.1)
Potassium: 3.9 mmol/L (ref 3.5–5.1)
SODIUM: 142 mmol/L (ref 135–145)
Sodium: 142 mmol/L (ref 135–145)
Sodium: 140 mmol/L (ref 135–145)
Sodium: 140 mmol/L (ref 135–145)
TCO2: 26 mmol/L (ref 0–100)
TCO2: 25 mmol/L (ref 0–100)
TCO2: 26 mmol/L (ref 0–100)
TCO2: 23 mmol/L (ref 0–100)

## 2015-08-08 LAB — PROTIME-INR
INR: 1.36 (ref 0.00–1.49)
Prothrombin Time: 16.8 seconds — ABNORMAL HIGH (ref 11.6–15.2)

## 2015-08-08 LAB — CBC
HCT: 28.1 % — ABNORMAL LOW (ref 39.0–52.0)
HEMATOCRIT: 27.3 % — AB (ref 39.0–52.0)
HEMOGLOBIN: 9.1 g/dL — AB (ref 13.0–17.0)
HEMOGLOBIN: 9 g/dL — AB (ref 13.0–17.0)
MCH: 33.7 pg (ref 26.0–34.0)
MCH: 32.1 pg (ref 26.0–34.0)
MCHC: 33.3 g/dL (ref 30.0–36.0)
MCHC: 32 g/dL (ref 30.0–36.0)
MCV: 101.1 fL — ABNORMAL HIGH (ref 78.0–100.0)
MCV: 100.4 fL — AB (ref 78.0–100.0)
Platelets: 162 10*3/uL (ref 150–400)
Platelets: 159 10*3/uL (ref 150–400)
RBC: 2.7 MIL/uL — AB (ref 4.22–5.81)
RBC: 2.8 MIL/uL — AB (ref 4.22–5.81)
RDW: 15.5 % (ref 11.5–15.5)
RDW: 15.1 % (ref 11.5–15.5)
WBC: 16.9 10*3/uL — ABNORMAL HIGH (ref 4.0–10.5)
WBC: 11.3 10*3/uL — AB (ref 4.0–10.5)

## 2015-08-08 LAB — POCT I-STAT 4, (NA,K, GLUC, HGB,HCT)
GLUCOSE: 109 mg/dL — AB (ref 65–99)
HCT: 28 % — ABNORMAL LOW (ref 39.0–52.0)
Hemoglobin: 9.5 g/dL — ABNORMAL LOW (ref 13.0–17.0)
POTASSIUM: 4.1 mmol/L (ref 3.5–5.1)
SODIUM: 141 mmol/L (ref 135–145)

## 2015-08-08 LAB — PREPARE RBC (CROSSMATCH)

## 2015-08-08 LAB — MAGNESIUM: MAGNESIUM: 2 mg/dL (ref 1.7–2.4)

## 2015-08-08 LAB — APTT: aPTT: 38 seconds — ABNORMAL HIGH (ref 24–37)

## 2015-08-08 SURGERY — IMPLANTATION, AORTIC VALVE, TRANSCATHETER, FEMORAL APPROACH
Anesthesia: General | Site: Chest

## 2015-08-08 MED ORDER — CHLORHEXIDINE GLUCONATE 4 % EX LIQD: 60.0000 mL | Freq: Once | CUTANEOUS | Status: DC

## 2015-08-08 MED ORDER — CHLORHEXIDINE GLUCONATE 0.12 % MT SOLN
15.0000 mL | OROMUCOSAL | Status: AC
  Administered 2015-08-08: 15 mL via OROMUCOSAL

## 2015-08-08 MED ORDER — NITROGLYCERIN IN D5W 200-5 MCG/ML-% IV SOLN
0.0000 ug/min | INTRAVENOUS | Status: DC
  Administered 2015-08-08: 33.333 ug/min via INTRAVENOUS

## 2015-08-08 MED ORDER — LACTATED RINGERS IV SOLN
INTRAVENOUS | Status: DC
  Administered 2015-08-08: 11:00:00 via INTRAVENOUS

## 2015-08-08 MED ORDER — FINASTERIDE 5 MG PO TABS
5.0000 mg | ORAL_TABLET | Freq: Every day | ORAL | Status: DC
  Administered 2015-08-08 – 2015-08-10 (×3): 5 mg via ORAL
  Filled 2015-08-08 (×3): qty 1

## 2015-08-08 MED ORDER — ONDANSETRON HCL 4 MG/2ML IJ SOLN
4.0000 mg | Freq: Four times a day (QID) | INTRAMUSCULAR | Status: DC | PRN
  Administered 2015-08-08 – 2015-08-09 (×3): 4 mg via INTRAVENOUS
  Filled 2015-08-08 (×3): qty 2

## 2015-08-08 MED ORDER — CLOPIDOGREL BISULFATE 75 MG PO TABS
75.0000 mg | ORAL_TABLET | Freq: Every day | ORAL | Status: DC
  Administered 2015-08-09 – 2015-08-10 (×2): 75 mg via ORAL
  Filled 2015-08-08 (×2): qty 1

## 2015-08-08 MED ORDER — PROTAMINE SULFATE 10 MG/ML IV SOLN
INTRAVENOUS | Status: DC | PRN
  Administered 2015-08-08: 10 mg via INTRAVENOUS
  Administered 2015-08-08: 50 mg via INTRAVENOUS

## 2015-08-08 MED ORDER — FENTANYL CITRATE (PF) 100 MCG/2ML IJ SOLN
INTRAMUSCULAR | Status: DC | PRN
  Administered 2015-08-08: 50 ug via INTRAVENOUS

## 2015-08-08 MED ORDER — 0.9 % SODIUM CHLORIDE (POUR BTL) OPTIME
TOPICAL | Status: DC | PRN
  Administered 2015-08-08: 1000 mL

## 2015-08-08 MED ORDER — LIDOCAINE 2% (20 MG/ML) 5 ML SYRINGE
INTRAMUSCULAR | Status: AC
  Filled 2015-08-08: qty 10

## 2015-08-08 MED ORDER — MORPHINE SULFATE (PF) 2 MG/ML IV SOLN: 1.0000 mg | INTRAVENOUS | Status: AC | PRN

## 2015-08-08 MED ORDER — ACETAMINOPHEN 500 MG PO TABS
1000.0000 mg | ORAL_TABLET | Freq: Four times a day (QID) | ORAL | Status: DC
  Administered 2015-08-08 – 2015-08-09 (×2): 1000 mg via ORAL
  Filled 2015-08-08 (×3): qty 2

## 2015-08-08 MED ORDER — ALBUMIN HUMAN 5 % IV SOLN: 250.0000 mL | INTRAVENOUS | Status: DC | PRN

## 2015-08-08 MED ORDER — PROTAMINE SULFATE 10 MG/ML IV SOLN
INTRAVENOUS | Status: AC
  Filled 2015-08-08: qty 10

## 2015-08-08 MED ORDER — TRAMADOL HCL 50 MG PO TABS
50.0000 mg | ORAL_TABLET | ORAL | Status: DC | PRN
  Administered 2015-08-08: 100 mg via ORAL
  Filled 2015-08-08: qty 2

## 2015-08-08 MED ORDER — CHLORHEXIDINE GLUCONATE 4 % EX LIQD: 30.0000 mL | CUTANEOUS | Status: DC

## 2015-08-08 MED ORDER — PANTOPRAZOLE SODIUM 40 MG PO TBEC
40.0000 mg | DELAYED_RELEASE_TABLET | Freq: Every day | ORAL | Status: DC
  Administered 2015-08-10: 40 mg via ORAL
  Filled 2015-08-08: qty 1

## 2015-08-08 MED ORDER — LACTATED RINGERS IV SOLN
INTRAVENOUS | Status: DC | PRN
  Administered 2015-08-08: 07:00:00 via INTRAVENOUS

## 2015-08-08 MED ORDER — ACETAMINOPHEN 160 MG/5ML PO SOLN: 1000.0000 mg | Freq: Four times a day (QID) | ORAL | Status: DC

## 2015-08-08 MED ORDER — SUGAMMADEX SODIUM 200 MG/2ML IV SOLN
INTRAVENOUS | Status: DC | PRN
  Administered 2015-08-08: 200 mg via INTRAVENOUS

## 2015-08-08 MED ORDER — METOPROLOL TARTRATE 25 MG/10 ML ORAL SUSPENSION: 12.5000 mg | Freq: Two times a day (BID) | ORAL | Status: DC

## 2015-08-08 MED ORDER — ETOMIDATE 2 MG/ML IV SOLN
INTRAVENOUS | Status: AC
  Filled 2015-08-08: qty 10

## 2015-08-08 MED ORDER — MORPHINE SULFATE (PF) 2 MG/ML IV SOLN: 2.0000 mg | INTRAVENOUS | Status: DC | PRN

## 2015-08-08 MED ORDER — ACETAMINOPHEN 160 MG/5ML PO SOLN: 650.0000 mg | Freq: Once | ORAL | Status: DC

## 2015-08-08 MED ORDER — DEXTROSE 5 % IV SOLN
1.5000 g | Freq: Two times a day (BID) | INTRAVENOUS | Status: AC
  Administered 2015-08-08 – 2015-08-10 (×4): 1.5 g via INTRAVENOUS
  Filled 2015-08-08 (×6): qty 1.5

## 2015-08-08 MED ORDER — ROCURONIUM BROMIDE 100 MG/10ML IV SOLN
INTRAVENOUS | Status: DC | PRN
  Administered 2015-08-08: 50 mg via INTRAVENOUS

## 2015-08-08 MED ORDER — IODIXANOL 320 MG/ML IV SOLN
INTRAVENOUS | Status: DC | PRN
Start: 2015-08-08 — End: 2015-08-08
  Administered 2015-08-08: 60.7 mL via INTRAVENOUS

## 2015-08-08 MED ORDER — SODIUM CHLORIDE 0.45 % IV SOLN
INTRAVENOUS | Status: DC
  Administered 2015-08-08: 11:00:00 via INTRAVENOUS

## 2015-08-08 MED ORDER — SODIUM CHLORIDE 0.9 % IV SOLN
INTRAVENOUS | Status: DC | PRN
  Administered 2015-08-08 (×3): 500 mL

## 2015-08-08 MED ORDER — METOPROLOL TARTRATE 5 MG/5ML IV SOLN: 2.5000 mg | INTRAVENOUS | Status: DC | PRN

## 2015-08-08 MED ORDER — LIDOCAINE HCL (CARDIAC) 20 MG/ML IV SOLN
INTRAVENOUS | Status: DC | PRN
  Administered 2015-08-08: 60 mg via INTRAVENOUS

## 2015-08-08 MED ORDER — METOPROLOL TARTRATE 12.5 MG HALF TABLET: 12.5000 mg | ORAL_TABLET | Freq: Two times a day (BID) | ORAL | Status: DC

## 2015-08-08 MED ORDER — CHLORHEXIDINE GLUCONATE CLOTH 2 % EX PADS
6.0000 | MEDICATED_PAD | Freq: Every day | CUTANEOUS | Status: DC
  Administered 2015-08-08 – 2015-08-10 (×3): 6 via TOPICAL

## 2015-08-08 MED ORDER — REMIFENTANIL HCL 1 MG IV SOLR
INTRAVENOUS | Status: DC | PRN
  Administered 2015-08-08: .25 ug/kg/min via INTRAVENOUS

## 2015-08-08 MED ORDER — ALBUMIN HUMAN 5 % IV SOLN
INTRAVENOUS | Status: DC | PRN
  Administered 2015-08-08: 07:00:00 via INTRAVENOUS

## 2015-08-08 MED ORDER — SODIUM CHLORIDE 0.9% FLUSH: 3.0000 mL | INTRAVENOUS | Status: DC | PRN

## 2015-08-08 MED ORDER — SODIUM CHLORIDE 0.9% FLUSH
3.0000 mL | Freq: Two times a day (BID) | INTRAVENOUS | Status: DC
  Administered 2015-08-09: 3 mL via INTRAVENOUS

## 2015-08-08 MED ORDER — PHENYLEPHRINE 40 MCG/ML (10ML) SYRINGE FOR IV PUSH (FOR BLOOD PRESSURE SUPPORT)
PREFILLED_SYRINGE | INTRAVENOUS | Status: AC
  Filled 2015-08-08: qty 10

## 2015-08-08 MED ORDER — VANCOMYCIN HCL IN DEXTROSE 1-5 GM/200ML-% IV SOLN
1000.0000 mg | Freq: Once | INTRAVENOUS | Status: AC
  Administered 2015-08-08: 1000 mg via INTRAVENOUS
  Filled 2015-08-08: qty 200

## 2015-08-08 MED ORDER — EPHEDRINE 5 MG/ML INJ
INTRAVENOUS | Status: AC
  Filled 2015-08-08: qty 10

## 2015-08-08 MED ORDER — ROCURONIUM BROMIDE 50 MG/5ML IV SOLN
INTRAVENOUS | Status: AC
  Filled 2015-08-08: qty 1

## 2015-08-08 MED ORDER — OXYCODONE HCL 5 MG PO TABS
5.0000 mg | ORAL_TABLET | ORAL | Status: DC | PRN
  Administered 2015-08-08: 10 mg via ORAL
  Filled 2015-08-08: qty 2

## 2015-08-08 MED ORDER — LIDOCAINE HCL 4 % EX SOLN
CUTANEOUS | Status: DC | PRN
  Administered 2015-08-08: 2 mL via TOPICAL

## 2015-08-08 MED ORDER — ONDANSETRON HCL 4 MG/2ML IJ SOLN
INTRAMUSCULAR | Status: DC | PRN
  Administered 2015-08-08: 4 mg via INTRAVENOUS

## 2015-08-08 MED ORDER — MIDAZOLAM HCL 2 MG/2ML IJ SOLN: 2.0000 mg | INTRAMUSCULAR | Status: DC | PRN

## 2015-08-08 MED ORDER — HEPARIN SODIUM (PORCINE) 1000 UNIT/ML IJ SOLN
INTRAMUSCULAR | Status: DC | PRN
  Administered 2015-08-08: 6000 [IU] via INTRAVENOUS

## 2015-08-08 MED ORDER — MUPIROCIN 2 % EX OINT
TOPICAL_OINTMENT | Freq: Two times a day (BID) | CUTANEOUS | Status: DC
  Administered 2015-08-08 – 2015-08-09 (×3): via NASAL
  Filled 2015-08-08: qty 22

## 2015-08-08 MED ORDER — ALBUMIN HUMAN 5 % IV SOLN
INTRAVENOUS | Status: AC
  Administered 2015-08-08: 12.5 g
  Filled 2015-08-08: qty 250

## 2015-08-08 MED ORDER — LACTATED RINGERS IV SOLN: 500.0000 mL | Freq: Once | INTRAVENOUS | Status: DC | PRN

## 2015-08-08 MED ORDER — SODIUM CHLORIDE 0.9 % IV SOLN: 1.0000 mL/kg/h | INTRAVENOUS | Status: AC

## 2015-08-08 MED ORDER — PROPOFOL 10 MG/ML IV BOLUS
INTRAVENOUS | Status: AC
  Filled 2015-08-08: qty 20

## 2015-08-08 MED ORDER — NOREPINEPHRINE BITARTRATE 1 MG/ML IV SOLN
4000.0000 ug | INTRAVENOUS | Status: DC | PRN
  Administered 2015-08-08: 2 ug/min via INTRAVENOUS

## 2015-08-08 MED ORDER — FENTANYL CITRATE (PF) 250 MCG/5ML IJ SOLN
INTRAMUSCULAR | Status: AC
  Filled 2015-08-08: qty 5

## 2015-08-08 MED ORDER — ETOMIDATE 2 MG/ML IV SOLN
INTRAVENOUS | Status: DC | PRN
  Administered 2015-08-08: 12 mg via INTRAVENOUS

## 2015-08-08 MED ORDER — ACETAMINOPHEN 650 MG RE SUPP: 650.0000 mg | Freq: Once | RECTAL | Status: DC

## 2015-08-08 MED ORDER — CHLORHEXIDINE GLUCONATE 0.12 % MT SOLN
15.0000 mL | Freq: Once | OROMUCOSAL | Status: AC
  Administered 2015-08-08: 15 mL via OROMUCOSAL
  Filled 2015-08-08: qty 15

## 2015-08-08 MED ORDER — DEXTROSE 5 % IV SOLN
0.0000 ug/min | INTRAVENOUS | Status: DC
  Filled 2015-08-08: qty 2

## 2015-08-08 MED ORDER — TAMSULOSIN HCL 0.4 MG PO CAPS
0.4000 mg | ORAL_CAPSULE | Freq: Every day | ORAL | Status: DC
  Administered 2015-08-08 – 2015-08-09 (×2): 0.4 mg via ORAL
  Filled 2015-08-08 (×2): qty 1

## 2015-08-08 MED ORDER — FAMOTIDINE IN NACL 20-0.9 MG/50ML-% IV SOLN
20.0000 mg | Freq: Two times a day (BID) | INTRAVENOUS | Status: DC
  Administered 2015-08-08: 20 mg via INTRAVENOUS
  Filled 2015-08-08: qty 50

## 2015-08-08 MED ORDER — SODIUM CHLORIDE 0.9 % IV SOLN
250.0000 mL | INTRAVENOUS | Status: DC | PRN
  Administered 2015-08-08: 250 mL via INTRAVENOUS

## 2015-08-08 MED ORDER — ASPIRIN EC 81 MG PO TBEC
81.0000 mg | DELAYED_RELEASE_TABLET | Freq: Every day | ORAL | Status: DC
  Administered 2015-08-08 – 2015-08-10 (×3): 81 mg via ORAL
  Filled 2015-08-08 (×3): qty 1

## 2015-08-08 MED ORDER — EPHEDRINE SULFATE 50 MG/ML IJ SOLN
INTRAMUSCULAR | Status: DC | PRN
  Administered 2015-08-08 (×3): 10 mg via INTRAVENOUS

## 2015-08-08 MED ORDER — ONDANSETRON HCL 4 MG/2ML IJ SOLN
INTRAMUSCULAR | Status: AC
  Filled 2015-08-08: qty 2

## 2015-08-08 MED ORDER — DEXMEDETOMIDINE HCL IN NACL 200 MCG/50ML IV SOLN
0.1000 ug/kg/h | INTRAVENOUS | Status: DC
  Filled 2015-08-08: qty 50

## 2015-08-08 MED FILL — Dextrose Inj 5%: INTRAVENOUS | Qty: 250 | Status: AC

## 2015-08-08 MED FILL — Magnesium Sulfate Inj 50%: INTRAMUSCULAR | Qty: 2 | Status: AC

## 2015-08-08 MED FILL — Potassium Chloride Inj 2 mEq/ML: INTRAVENOUS | Qty: 40 | Status: AC

## 2015-08-08 MED FILL — Phenylephrine HCl Inj 10 MG/ML: INTRAMUSCULAR | Qty: 2 | Status: AC

## 2015-08-08 MED FILL — Heparin Sodium (Porcine) Inj 1000 Unit/ML: INTRAMUSCULAR | Qty: 30 | Status: AC

## 2015-08-08 MED FILL — Insulin Regular (Human) Inj 100 Unit/ML: INTRAMUSCULAR | Qty: 250 | Status: AC

## 2015-08-08 SURGICAL SUPPLY — 102 items
ADAPTER UNIV SWAN GANZ BIP (ADAPTER) ×2 IMPLANT
ADAPTER UNV SWAN GANZ BIP (ADAPTER) ×2
ADH SKN CLS APL DERMABOND .7 (GAUZE/BANDAGES/DRESSINGS) ×4
ADPR CATH UNV NS SG CATH (ADAPTER) ×4
ATTRACTOMAT 16X20 MAGNETIC DRP (DRAPES) IMPLANT
BAG BANDED W/RUBBER/TAPE 36X54 (MISCELLANEOUS) ×3 IMPLANT
BAG DECANTER FOR FLEXI CONT (MISCELLANEOUS) IMPLANT
BAG EQP BAND 135X91 W/RBR TAPE (MISCELLANEOUS) ×2
BAG SNAP BAND KOVER 36X36 (MISCELLANEOUS) ×6 IMPLANT
BLADE 10 SAFETY STRL DISP (BLADE) ×3 IMPLANT
BLADE STERNUM SYSTEM 6 (BLADE) ×3 IMPLANT
BLADE SURG ROTATE 9660 (MISCELLANEOUS) IMPLANT
CABLE PACING FASLOC BIEGE (MISCELLANEOUS) ×3 IMPLANT
CABLE PACING FASLOC BLUE (MISCELLANEOUS) ×3 IMPLANT
CANISTER SUCTION 2500CC (MISCELLANEOUS) IMPLANT
CANNULA FEM VENOUS REMOTE 22FR (CANNULA) IMPLANT
CANNULA OPTISITE PERFUSION 16F (CANNULA) IMPLANT
CANNULA OPTISITE PERFUSION 18F (CANNULA) IMPLANT
CATH DIAG EXPO 6F AL2 (CATHETERS) ×1 IMPLANT
CATH DIAG EXPO 6F VENT PIG 145 (CATHETERS) ×2 IMPLANT
CATH S G BIP PACING (SET/KITS/TRAYS/PACK) ×7 IMPLANT
CLIP TI MEDIUM 24 (CLIP) ×3 IMPLANT
CLIP TI WIDE RED SMALL 24 (CLIP) ×3 IMPLANT
CONT SPEC 4OZ CLIKSEAL STRL BL (MISCELLANEOUS) ×1 IMPLANT
COVER DOME SNAP 22 D (MISCELLANEOUS) ×3 IMPLANT
COVER MAYO STAND STRL (DRAPES) ×3 IMPLANT
COVER TABLE BACK 60X90 (DRAPES) ×3 IMPLANT
CRADLE DONUT ADULT HEAD (MISCELLANEOUS) ×3 IMPLANT
DERMABOND ADVANCED (GAUZE/BANDAGES/DRESSINGS) ×2
DERMABOND ADVANCED .7 DNX12 (GAUZE/BANDAGES/DRESSINGS) ×2 IMPLANT
DEVICE CLOSURE PERCLS PRGLD 6F (VASCULAR PRODUCTS) IMPLANT
DRAPE INCISE IOBAN 66X45 STRL (DRAPES) IMPLANT
DRAPE SLUSH MACHINE 52X66 (DRAPES) ×3 IMPLANT
DRAPE TABLE COVER HEAVY DUTY (DRAPES) ×3 IMPLANT
DRSG TEGADERM 4X4.75 (GAUZE/BANDAGES/DRESSINGS) ×3 IMPLANT
ELECT REM PT RETURN 9FT ADLT (ELECTROSURGICAL) ×6
ELECTRODE REM PT RTRN 9FT ADLT (ELECTROSURGICAL) ×4 IMPLANT
FELT TEFLON 6X6 (MISCELLANEOUS) ×3 IMPLANT
FEMORAL VENOUS CANN RAP (CANNULA) IMPLANT
GAUZE SPONGE 4X4 12PLY STRL (GAUZE/BANDAGES/DRESSINGS) ×3 IMPLANT
GLOVE BIO SURGEON STRL SZ7.5 (GLOVE) ×3 IMPLANT
GLOVE BIO SURGEON STRL SZ8 (GLOVE) ×6 IMPLANT
GLOVE EUDERMIC 7 POWDERFREE (GLOVE) ×3 IMPLANT
GLOVE ORTHO TXT STRL SZ7.5 (GLOVE) ×3 IMPLANT
GOWN STRL REUS W/ TWL LRG LVL3 (GOWN DISPOSABLE) ×6 IMPLANT
GOWN STRL REUS W/ TWL XL LVL3 (GOWN DISPOSABLE) ×12 IMPLANT
GOWN STRL REUS W/TWL LRG LVL3 (GOWN DISPOSABLE) ×9
GOWN STRL REUS W/TWL XL LVL3 (GOWN DISPOSABLE) ×18
GUIDEWIRE SAF TJ AMPL .035X180 (WIRE) ×3 IMPLANT
GUIDEWIRE SAFE TJ AMPLATZ EXST (WIRE) ×4 IMPLANT
GUIDEWIRE STRAIGHT .035 260CM (WIRE) ×4 IMPLANT
INSERT FOGARTY 61MM (MISCELLANEOUS) ×3 IMPLANT
INSERT FOGARTY SM (MISCELLANEOUS) ×6 IMPLANT
INSERT FOGARTY XLG (MISCELLANEOUS) IMPLANT
KIT BASIN OR (CUSTOM PROCEDURE TRAY) ×3 IMPLANT
KIT DILATOR VASC 18G NDL (KITS) IMPLANT
KIT HEART LEFT (KITS) ×3 IMPLANT
KIT ROOM TURNOVER OR (KITS) ×3 IMPLANT
KIT SUCTION CATH 14FR (SUCTIONS) ×6 IMPLANT
NDL PERC 18GX7CM (NEEDLE) ×2 IMPLANT
NEEDLE PERC 18GX7CM (NEEDLE) ×3 IMPLANT
NS IRRIG 1000ML POUR BTL (IV SOLUTION) ×9 IMPLANT
PACK AORTA (CUSTOM PROCEDURE TRAY) ×3 IMPLANT
PAD ARMBOARD 7.5X6 YLW CONV (MISCELLANEOUS) ×6 IMPLANT
PAD ELECT DEFIB RADIOL ZOLL (MISCELLANEOUS) ×3 IMPLANT
PATCH TACHOSII LRG 9.5X4.8 (VASCULAR PRODUCTS) IMPLANT
PERCLOSE PROGLIDE 6F (VASCULAR PRODUCTS) ×6
SET MICROPUNCTURE 5F STIFF (MISCELLANEOUS) ×4 IMPLANT
SHEATH AVANTI 11CM 8FR (MISCELLANEOUS) ×4 IMPLANT
SHEATH PINNACLE 6F 10CM (SHEATH) ×8 IMPLANT
SLEEVE REPOSITIONING LENGTH 30 (MISCELLANEOUS) ×4 IMPLANT
SPONGE LAP 4X18 X RAY DECT (DISPOSABLE) ×3 IMPLANT
STOPCOCK MORSE 400PSI 3WAY (MISCELLANEOUS) ×8 IMPLANT
SUT ETHIBOND X763 2 0 SH 1 (SUTURE) ×3 IMPLANT
SUT GORETEX CV 4 TH 22 36 (SUTURE) ×3 IMPLANT
SUT GORETEX CV4 TH-18 (SUTURE) ×9 IMPLANT
SUT GORETEX TH-18 36 INCH (SUTURE) ×6 IMPLANT
SUT MNCRL AB 3-0 PS2 18 (SUTURE) ×3 IMPLANT
SUT PROLENE 3 0 SH1 36 (SUTURE) IMPLANT
SUT PROLENE 4 0 RB 1 (SUTURE) ×3
SUT PROLENE 4-0 RB1 .5 CRCL 36 (SUTURE) ×2 IMPLANT
SUT PROLENE 5 0 C 1 36 (SUTURE) ×6 IMPLANT
SUT PROLENE 6 0 C 1 30 (SUTURE) ×6 IMPLANT
SUT SILK  1 MH (SUTURE) ×1
SUT SILK 1 MH (SUTURE) ×2 IMPLANT
SUT SILK 2 0 SH CR/8 (SUTURE) IMPLANT
SUT VIC AB 2-0 CT1 27 (SUTURE) ×3
SUT VIC AB 2-0 CT1 TAPERPNT 27 (SUTURE) ×2 IMPLANT
SUT VIC AB 2-0 CTX 36 (SUTURE) IMPLANT
SUT VIC AB 3-0 SH 8-18 (SUTURE) ×6 IMPLANT
SYR 30ML LL (SYRINGE) ×8 IMPLANT
SYR 50ML LL SCALE MARK (SYRINGE) ×3 IMPLANT
SYRINGE 10CC LL (SYRINGE) ×3 IMPLANT
TOWEL OR 17X26 10 PK STRL BLUE (TOWEL DISPOSABLE) ×6 IMPLANT
TRANSDUCER W/STOPCOCK (MISCELLANEOUS) ×8 IMPLANT
TRAY FOLEY IC TEMP SENS 14FR (CATHETERS) ×3 IMPLANT
TUBE SUCT INTRACARD DLP 20F (MISCELLANEOUS) IMPLANT
TUBING HIGH PRESSURE 120CM (CONNECTOR) ×3 IMPLANT
VALVE HEART TRANSCATH SZ3 26MM (Prosthesis & Implant Heart) ×1 IMPLANT
WIRE .035 3MM-J 145CM (WIRE) ×1 IMPLANT
WIRE AMPLATZ SS-J .035X180CM (WIRE) ×4 IMPLANT
WIRE BENTSON .035X145CM (WIRE) ×3 IMPLANT

## 2015-08-08 NOTE — Interval H&P Note (Signed)
History and Physical Interval Note:  08/08/2015 5:53 AM  Kristen Cardinal  has presented today for surgery, with the diagnosis of SEVERE AS  The various methods of treatment have been discussed with the patient and family. After consideration of risks, benefits and other options for treatment, the patient has consented to  Procedure(s): TRANSCATHETER AORTIC VALVE REPLACEMENT, TRANSFEMORAL (N/A) TRANSESOPHAGEAL ECHOCARDIOGRAM (TEE) (N/A) as a surgical intervention .  The patient's history has been reviewed, patient examined, no change in status, stable for surgery.  I have reviewed the patient's chart and labs.  Questions were answered to the patient's satisfaction.     Gaye Pollack

## 2015-08-08 NOTE — Care Management Note (Signed)
Case Management Note  Patient Details  Name: James Hardy MRN: [id number] Date of Birth: Nov 08, 1928  Subjective/Objective:           Pt s/p TAVR         Action/Plan:   PTA - independent from home with wife.  CM will continue to monitor for disposition needs   Expected Discharge Date:                  Expected Discharge Plan:  Home/Self Care  In-House Referral:     Discharge planning Services  CM Consult  Post Acute Care Choice:    Choice offered to:     DME Arranged:    DME Agency:     HH Arranged:    HH Agency:     Status of Service:  In process, will continue to follow  Medicare Important Message Given:    Date Medicare IM Given:    Medicare IM give by:    Date Additional Medicare IM Given:    Additional Medicare Important Message give by:     If discussed at [place] of Stay Meetings, dates discussed:    Additional Comments:  Maryclare Labrador, RN 08/08/2015, 11:37 AM

## 2015-08-08 NOTE — Op Note (Signed)
[facility]   MULTIDISCIPLINARY HEART VALVE TEAM   TAVR OPERATIVE NOTE   Date of Procedure:  08/08/2015  Preoperative Diagnosis: Severe Aortic Stenosis   Postoperative Diagnosis: Same   Procedure:    Transcatheter Aortic Valve Replacement - Percutaneous Right Transfemoral Approach  Edwards Sapien 3 THV (size 26 mm, model # 9600TFX, serial # [id number])   Co-Surgeons:  Gaye Pollack, MD and Sherren Mocha, MD     Anesthesiologist:  Suella Broad, MD  Echocardiographer:  Ena Dawley, MD  Pre-operative Echo Findings:   severe aortic stenosis   normal left ventricular systolic function   Post-operative Echo Findings:  Trace-mild paravalvular leak  normal left ventricular systolic function    BRIEF CLINICAL NOTE AND INDICATIONS FOR SURGERY  The patient is an 82 year old gentleman with a history of heart murmur who has been followed by Dr. Wynonia Lawman since 2014 with moderate aortic stenosis now has stage D severe symptomatic aortic stenosis with NYHA class 2 symptoms of exertional fatigue, shortness of breath and chest discomfort due to chronic diastolic congestive heart failure. The patient says that over the past year he has developed symptoms of exertional fatigue, shortness of breath and chest discomfort that have worsened over the past 4-5 months. 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 He has been walking but has to stop after about 1/4 mile. His symptoms resolve within a few minutes of stopping. He describes the chest discomfort as a dull ache in the center of his chest. His most recent echo in 03/2015 showed progression to severe AS with a mean gradient of 44 mm Hg with an EF of 55%. There was trace MR. He was referred to Dr. Burt Knack and cath showed a 60% focal stenosis in the mid LAD with normal right heart pressures. His echo shows a trileaflet valve with  severely calcified and poorly mobile leaflets with a mean gradient of 44 mm Hg. Cardiac cath shows a focal 60% mid LAD stenosis of unclear significance. He does have exertional dull substernal chest aching that is likely due to the combination of severe AS with this LAD stenosis. The stenosis is certainly not high grade and could be treated medically for now and stented later if he has persistent exertional chest discomfort after AVR. I have personally reviewed his gated cardiac CT and his CTA of the chest, abdomen and pelvis. He has annular measurements suitable for a 29 mm Sapien 3 valve with no unfavorable anatomic findings. There is adequate pelvic access bilaterally for a transfemoral approach.  During the course of the patient's preoperative work up they have been evaluated comprehensively by a multidisciplinary team of specialists coordinated through the [facility] in the [facility].  They have been demonstrated to suffer from symptomatic severe aortic stenosis as noted above. The patient has been counseled extensively as to the relative risks and benefits of all options for the treatment of severe aortic stenosis including long term medical therapy, conventional surgery for aortic valve replacement, and transcatheter aortic valve replacement.  The patient has been independently evaluated by two cardiac surgeons including myself and Dr. Roxy Manns, and they are felt to be at moderate risk for conventional surgical aortic valve replacement based upon a predicted risk of mortality using the Society of Thoracic Surgeons risk calculator of 4%.   Based upon review of all of  the patient's preoperative diagnostic tests they are felt to be candidate for transcatheter aortic valve replacement using the transfemoral approach as an alternative to moderate risk conventional surgery.    Following the decision to proceed with transcatheter aortic valve replacement, a  discussion has been held regarding what types of management strategies would be attempted intraoperatively in the event of life-threatening complications, including whether or not the patient would be considered a candidate for the use of cardiopulmonary bypass and/or conversion to open sternotomy for attempted surgical intervention.  The patient has been advised of a variety of complications that might develop peculiar to this approach including but not limited to risks of death, stroke, paravalvular leak, aortic dissection or other major vascular complications, aortic annulus rupture, device embolization, cardiac rupture or perforation, acute myocardial infarction, arrhythmia, heart block or bradycardia requiring permanent pacemaker placement, congestive heart failure, respiratory failure, renal failure, pneumonia, infection, other late complications related to structural valve deterioration or migration, or other complications that might ultimately cause a temporary or permanent loss of functional independence or other long term morbidity.  The patient provides full informed consent for the procedure as described and all questions were answered preoperatively.    DETAILS OF THE OPERATIVE PROCEDURE  PREPARATION:    The patient is brought to the operating room on the above mentioned date and central monitoring was established by the anesthesia team including placement of Swan-Ganz catheter and radial arterial line. The patient is placed in the supine position on the operating table.  Intravenous antibiotics are administered. General endotracheal anesthesia is induced uneventfully.  A Foley catheter is placed.  Baseline transesophageal echocardiogram was performed. The patient's chest, abdomen, both groins, and both lower extremities are prepared and draped in a sterile manner. A time out procedure is performed.   Peripheral and transfemoral access was performed by Dr. Burt Knack and will be dictated in his  note.      BALLOON AORTIC VALVULOPLASTY:   Balloon aortic valvuloplasty was performed using a 23 mm valvuloplasty balloon.  Once optimal position was achieved, BAV was done under rapid ventricular pacing. The patient recovered well hemodynamically.    TRANSCATHETER HEART VALVE DEPLOYMENT:   An Edwards Sapien 3 transcatheter heart valve (size 26 mm, model #9600TFX, serial ID:[id number]) was prepared and crimped per manufacturer's guidelines, and the proper orientation of the valve is confirmed on the [employer] delivery system. The valve was advanced through the introducer sheath using normal technique until in an appropriate position in the abdominal aorta beyond the sheath tip. The balloon was then retracted and using the fine-tuning wheel was centered on the valve. The valve was then advanced across the aortic arch using appropriate flexion of the catheter. The valve was carefully positioned across the aortic valve annulus. The Commander catheter was retracted using normal technique. Once final position of the valve has been confirmed by angiographic assessment, the valve is deployed while temporarily holding ventilation and during rapid ventricular pacing to maintain systolic blood pressure < 50 mmHg and pulse pressure < 10 mmHg. The balloon inflation is held for >3 seconds after reaching full deployment volume. Once the balloon has fully deflated the balloon is retracted into the ascending aorta and valve function is assessed using echocardiography. There is felt to be trace to mild paravalvular leak and no central aortic insufficiency.  The patient's hemodynamic recovery following valve deployment is good.  The deployment balloon and guidewire are both removed. Echo demostrated acceptable post-procedural gradients, stable mitral valve function, and trace to  mild  aortic insufficiency.  PROCEDURE COMPLETION:   The sheath was removed and femoral artery closure performed by Dr Burt Knack. Please see  his separate report for details.   Protamine was administered once femoral arterial repair was complete. The temporary pacemaker, pigtail catheters and femoral sheaths were removed with manual pressure used for hemostasis.   The patient tolerated the procedure well and is transported to the surgical intensive care in stable condition. There were no immediate intraoperative complications. All sponge instrument and needle counts are verified correct at completion of the operation.   No blood products were administered during the operation.  The patient received a total of 60 mL of intravenous contrast during the procedure.   Gaye Pollack, MD 08/08/2015 11:37 AM

## 2015-08-08 NOTE — Progress Notes (Signed)
Patient arrived from the OR. Placed on monitor. Patient is alert and orientedX4.

## 2015-08-08 NOTE — Anesthesia Postprocedure Evaluation (Signed)
Anesthesia Post Note  Patient: James Hardy  Procedure(s) Performed: Procedure(s) (LRB): TRANSCATHETER AORTIC VALVE REPLACEMENT, TRANSFEMORAL (N/A) TRANSESOPHAGEAL ECHOCARDIOGRAM (TEE) (N/A)  Patient location during evaluation: ICU Anesthesia Type: General Level of consciousness: awake and alert Pain management: pain level controlled Vital Signs Assessment: post-procedure vital signs reviewed and stable Respiratory status: spontaneous breathing, nonlabored ventilation, respiratory function stable and patient connected to nasal cannula oxygen Cardiovascular status: blood pressure returned to baseline and stable Postop Assessment: no signs of nausea or vomiting Anesthetic complications: no    Last Vitals:  Filed Vitals:   08/08/15 1315 08/08/15 1330  BP:    Pulse: 59 57  Temp: 36.9 C 36.9 C  Resp: 13 0    Last Pain:  Filed Vitals:   08/08/15 1332  PainSc: Asleep                 Effie Berkshire

## 2015-08-08 NOTE — H&P (Signed)
[address]       [place] [address]             [phone]      Cardiothoracic Surgery History and Physical   Referring Provider is Sherren Mocha, MD PCP is [name], MD  Chief Complaint  Patient presents with  . Aortic Stenosis    Surgical eval for TAVR, Cardiac Cath 07/25/15, PFT's 07/31/15, ECHO 04/24/15    HPI:  The patient is an 82 year old gentleman with a history of heart murmur who has been followed by Dr. Wynonia Lawman since 2014 with moderate aortic stenosis. The patient says that over the past year he has developed symptoms of exertional fatigue, shortness of breath and chest discomfort that have worsened over the past 4-5 months. 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 He has been walking but has to stop after about 1/4 mile. His symptoms resolve within a few minutes of stopping. He describes the chest discomfort as a dull ache in the center of his chest. His most recent echo in 03/2015 showed progression to severe AS with a mean gradient of 44 mm Hg with an EF of 55%. There was trace MR. He was referred to Dr. Burt Knack and cath showed a 60% focal stenosis in the mid LAD with normal right heart pressures.  The patient is a retired Chief Financial Officer and lives with his wife who is in good condition. His wife and son are with him today. He still drives, exercises three days per week and wants to stay active in his yard.  Past Medical History  Diagnosis Date  . HYPERLIPIDEMIA 04/11/2009    Qualifier: Diagnosis of By: Joyce Gross   . CERUMEN IMPACTION 04/11/2009    Qualifier: Diagnosis of By: Joyce Gross   . INTERNAL HEMORRHOIDS 08/28/2006    Qualifier: Diagnosis of By: Jerral Ralph   . APHTHOUS ULCERS 12/19/2009    Qualifier: Diagnosis of By: [name] MD, Bruce   . ANGULAR CHEILITIS 04/11/2009     Qualifier: Diagnosis of By: Joyce Gross   . GERD 12/02/2008    Qualifier: Diagnosis of By: [name] MD, Bruce   . DIVERTICULOSIS, COLON 08/28/2006    Qualifier: Diagnosis of By: Jerral Ralph   . OSTEOPOROSIS 08/21/2007    Qualifier: Diagnosis of By: Jerral Ralph   . SLEEP APNEA 08/21/2007    Qualifier: Diagnosis of By: Jerral Ralph   . COLONIC POLYPS, HX OF 12/02/2008    Qualifier: Diagnosis of By: [name] MD, Bruce   . COLITIS, HX OF 08/21/2007    Qualifier: Diagnosis of By: Jerral Ralph   . BENIGN PROSTATIC HYPERTROPHY, HX OF 08/21/2007    Qualifier: Diagnosis of By: Jerral Ralph     Past Surgical History  Procedure Laterality Date  . Hemorrhoid surgery  2001  . Cardiac catheterization N/A 07/25/2015    Procedure: Right/Left Heart Cath and Coronary Angiography; Surgeon: Sherren Mocha, MD; Location: [facility]; Service: Cardiovascular; Laterality: N/A;    Family History  Problem Relation Age of Onset  . Heart disease Other   . Asthma Mother     Social History   Social History  . Marital Status: Married    Spouse Name: N/A  . Number of Children: N/A  . Years of Education: N/A   Occupational History  . Not on file.   Social History Main  Topics  . Smoking status: Never Smoker   . Smokeless tobacco: Not on file  . Alcohol Use: No  . Drug Use: No  . Sexual Activity: Not on file   Other Topics Concern  . Not on file   Social History Narrative    Current Outpatient Prescriptions  Medication Sig Dispense Refill  . aspirin 81 MG tablet Take 81 mg by mouth daily.    . calcium-vitamin D (OSCAL WITH D) 250-125 MG-UNIT tablet Take 2 tablets by mouth 2 (two) times daily.    . Cholecalciferol (VITAMIN D3) 5000 units TABS Take 5,000 Units by mouth daily.      Marland Kitchen doxazosin (CARDURA) 8 MG tablet Take 8 mg by mouth at bedtime.    . finasteride (PROSCAR) 5 MG tablet Take 5 mg by mouth daily.    Marland Kitchen glucosamine-chondroitin 500-400 MG tablet Take 2 tablets by mouth 2 (two) times daily.    . metoprolol succinate (TOPROL-XL) 25 MG 24 hr tablet Take 25 mg by mouth daily.     . Multiple Vitamin (MULTIVITAMIN) tablet Take 1 tablet by mouth daily.    . NON FORMULARY CPAP machine    . Omega-3 Fatty Acids (FISH OIL) 1000 MG CAPS Take 1,000 mg by mouth daily.    Marland Kitchen omeprazole (PRILOSEC) 20 MG capsule TAKE 1 CAPSULE (20 MG TOTAL) BY MOUTH DAILY. 90 capsule 1  . Probiotic Product (PRO-BIOTIC BLEND) CAPS Take 1 capsule by mouth daily as needed (digestive issues).     . tamsulosin (FLOMAX) 0.4 MG CAPS capsule Take 0.4 mg by mouth daily.      No current facility-administered medications for this visit.    Allergies  Allergen Reactions  . Neosporin [Neomycin-Bacitracin Zn-Polymyx] Rash      Review of Systems:  General:normal appetite, decreased energy, no weight gain, no weight loss, no fever Cardiac:Has chest pain with exertion, no chest pain at rest, has moderate SOB with mild exertion, no resting SOB, no PND, no orthopnea, no palpitations, no arrhythmia, no atrial fibrillation, no LE edema, no dizzy spells, no syncope Respiratory:exertional shortness of breath, no home oxygen, no productive cough, no dry cough, no bronchitis, no wheezing, no hemoptysis, no asthma, no pain with inspiration or cough, no sleep apnea, no CPAP at night GI:no difficulty swallowing, has reflux, no frequent heartburn, has hiatal hernia, no abdominal pain, no constipation, no diarrhea, no hematochezia, no hematemesis, no melena GU:no dysuria, no  frequency, no urinary tract infection, no hematuria, has enlarged prostate, no kidney stones, no kidney disease Vascular:no pain suggestive of claudication, no pain in feet, no leg cramps, no varicose veins, no DVT, no non-healing foot ulcer Neuro:no stroke, no TIA's, no seizures, no headaches, notemporary blindness one eye, no slurred speech, no peripheral neuropathy, no chronic pain, no instability of gait, no memory/cognitive dysfunction Musculoskeletal:no arthritis, no joint swelling, no myalgias, no difficulty walking, no mobility  Skin:no rash, no itching, no skin infections, no pressure sores or ulcerations Psych:no anxiety, no depression, no nervousness, no unusual recent stress Eyes:no blurry vision, no floaters, no recent vision changes, wears glasses  ENT:no hearing loss, no loose or painful teeth, no dentures, last saw dentist recently Hematologic:has easy bruising, no abnormal bleeding, no clotting disorder, no frequent epistaxis Endocrine:no diabetes, does not check CBG's at home     Physical Exam:  BP 109/58 mmHg  Pulse 68  Resp 20  Ht 5\' 2"  (1.575 m)  Wt 142 lb (64.411 kg)  BMI 25.97 kg/m2  SpO2 98% General:Elderly but  well-appearing HEENT:Unremarkable , NCAT, PERLA, EOMI, oropharynx clear, teeth in good condition Neck:no JVD, no bruits, no adenopathy or thyromegaly. Transmitted murmur to both sides of the neck. Chest:clear to auscultation, symmetrical breath sounds, no wheezes, no rhonchi   CV:RRR, grade III/VI crescendo/decrescendo murmur heard best at RSB, no diastolic murmur Abdomen:soft, non-tender, no masses or organomegaly Extremities:warm, well-perfused, pulses palpable, no LE edema Rectal/GUDeferred Neuro:Grossly non-focal and symmetrical throughout Skin:Clean and dry, no rashes, no breakdown   Diagnostic Tests:  2D echo done in Dr. Thurman Coyer office 04/24/2015. Report reviewed   Sherren Mocha, MD (Primary)      Procedures    Right/Left Heart Cath and Coronary Angiography    Conclusion     Mid LAD lesion, 60% stenosed.  1. Single vessel CAD with moderate focal stenosis of the mid-LAD 2. Severely calcified aortic valve with known severe AS 3. Normal right-sided hemodynamics  Recommend: continued evaluation by the Multidisciplinary Heart Valve Team     Indications    Severe aortic stenosis [I35.0 (ICD-10-CM)]    Technique and Indications    INDICATION: Severe aortic stenosis  PROCEDURAL DETAILS: There was an indwelling IV in a right antecubital vein. Using normal sterile technique, the IV was changed out for a 5 Fr brachial sheath over a 0.018 inch wire. The right wrist was then prepped, draped, and anesthetized with 1% lidocaine. Using the modified Seldinger technique a 5/6 French Slender sheath was placed in the right radial artery. Intra-arterial verapamil was administered through the radial artery sheath. IV heparin was administered after a JR4 catheter was advanced into the central aorta. A Swan-Ganz catheter was used for the right heart catheterization. Standard protocol was followed for recording of right heart pressures and sampling of oxygen saturations. Fick cardiac output was calculated. Standard Judkins  catheters were used for selective coronary angiography and aortic root angiography. There were no immediate procedural complications. The patient was transferred to the post catheterization recovery area for further monitoring.  During this procedure the patient is administered a total of Versed 1 mg and Fentanyl 25 mg to achieve and maintain moderate conscious sedation. The patient's heart rate, blood pressure, and oxygen saturation are monitored continuously during the procedure. The period of conscious sedation is 25 minutes, of which I was present face-to-face 100% of this time.  Estimated blood loss <50 mL. There were no immediate complications during the procedure.    Coronary Findings    Dominance: Right   Left Anterior Descending   . Mid LAD lesion, 60% stenosed. Eccentric, calcified LAD stenosis, moderately stenosed at approximately 60%. No other significant CAD     Right Coronary Artery  . Vessel is normal in caliber. Normal RCA      Left Heart    Aortic Valve There is severe aortic valve stenosis, and no aortic valve regurgitation. The aortic valve is calcified. There is restricted aortic valve motion. Severe aortic stenosis by noninvasive assessment. Heavily calcified aortic valve leaflets with restricted leaflet mobility, no significant AI on aortic root angiography.    Coronary Diagrams    Diagnostic Diagram            Implants    No implant documentation for this case.    PACS Images    Show images for Cardiac catheterization     Link to Procedure Log    Procedure Log      Hemo Data       Most Recent Value   Fick Cardiac Output  5.04 L/min   Fick  Cardiac Output Index  3.05 (L/min)/BSA   RA A Wave  8 mmHg   RA V Wave  6 mmHg   RA Mean  5 mmHg   RV Systolic Pressure  35 mmHg   RV Diastolic Pressure  1 mmHg   RV EDP  7 mmHg   PA Systolic Pressure   37 mmHg   PA Diastolic Pressure  15 mmHg   PA Mean  22 mmHg   PW A Wave  15 mmHg   PW V Wave  17 mmHg   PW Mean  12 mmHg   AO Systolic Pressure  123XX123 mmHg   AO Diastolic Pressure  55 mmHg   AO Mean  81 mmHg   QP/QS  1   TPVR Index  7.2 HRUI   TSVR Index  26.5 HRUI   PVR SVR Ratio  0.14   TPVR/TSVR Ratio  0.27    ADDENDUM REPORT: 08/01/2015 22:31 CLINICAL DATA: 82 year old male with severe aortic stenosis EXAM: Cardiac TAVR CT TECHNIQUE: The patient was scanned on a Philips 256 scanner. A 120 kV retrospective scan was triggered in the descending thoracic aorta at 111 HU's. Gantry rotation speed was 270 msecs and collimation was .9 mm. No beta blockade or nitro were given. The 3D data set was reconstructed in 5% intervals of the R-R cycle. Systolic and diastolic phases were analyzed on a dedicated work station using MPR, MIP and VRT modes. The patient received 80 cc of contrast. FINDINGS: Aortic Valve: Severely thickened and calcified trileaflet aortic valve with severely restricted leaflet opening. Mild calcifications extending into the LVOT. Aorta: Normal caliber, minimal diffuse calcifications. No dissection. Sinotubular Junction: 27 x 26 mm Ascending Thoracic Aorta: 31 x 30 mm Aortic Arch: 28 x 25 mm Descending Thoracic Aorta: 28 x 27 mm Sinus of Valsalva Measurements: Non-coronary: 32 mm Right -coronary: 29 mm Left -coronary: 35 mm Coronary Artery Height above Annulus: Left Main: 11 mm Right Coronary: 15 mm Virtual Basal Annulus Measurements: Maximum/Minimum Diameter: 27 x 23 mm Perimeter: 85 mm Area: 480 mm2 Optimum Fluoroscopic Angle for Delivery: LAO 14 CAU 10 IMPRESSION: 1. Severely thickened and calcified trileaflet aortic valve with severely restricted leaflet opening and annular measurements suitable for delivery of a 29 mm Edward SAPIEN 3 TAVR valve. 2. Sufficient annulus to  coronary distance. 3. Optimum Fluoroscopic Angle for Delivery: LAO 14 CAU 10 Ena Dawley Electronically Signed  By: Ena Dawley  On: 08/01/2015 22:31     Study Result     EXAM: OVER-READ INTERPRETATION CT CHEST  The following report is an over-read performed by radiologist Dr. Rebekah [facility] on 08/01/2015. This over-read does not include interpretation of cardiac or coronary anatomy or pathology. The coronary calcium score/coronary CTA interpretation by the cardiologist is attached.  COMPARISON: Chest CT 05/02/2008.  FINDINGS: Noncardiac findings will be dictated under separate requisition for contemporaneously obtained CTA of the chest, abdomen and pelvis dated 07/31/2015.  IMPRESSION: Please see separate dictation for full description of noncardiac findings on CTA of the chest, abdomen and pelvis dated 07/31/2015.  Electronically Signed: By: Vinnie Langton M.D. On: 08/01/2015 09:49    CLINICAL DATA: 82 year old male with history of severe aortic stenosis. Preprocedural study prior to potential transcatheter aortic valve replacement (TAVR) procedure.  EXAM: CT ANGIOGRAPHY CHEST, ABDOMEN AND PELVIS  TECHNIQUE: Multidetector CT imaging through the chest, abdomen and pelvis was performed using the standard protocol during bolus administration of intravenous contrast. Multiplanar reconstructed images and MIPs were obtained and reviewed to  evaluate the vascular anatomy.  CONTRAST: 75 mL of Isovue 370.  COMPARISON: Chest CT 05/02/2008.  FINDINGS: CTA CHEST FINDINGS  Mediastinum/Lymph Nodes: Heart size is normal. There is no significant pericardial fluid, thickening or pericardial calcification. There is atherosclerosis of the thoracic aorta, the great vessels of the mediastinum and the coronary arteries, including calcified atherosclerotic plaque in the left anterior descending coronary artery. Severe  calcifications and thickening of the aortic valve. No pathologically enlarged mediastinal or hilar lymph nodes. Densely calcified right hilar lymph node incidentally noted. Small hiatal hernia. No axillary lymphadenopathy.  Lungs/Pleura: 7 x 5 mm (mean diameter 6 mm) pleural-based nodule in the inferior segment of the lingula is unchanged compared to prior study 05/02/2008, presumably a focal area of scarring. A few other scattered areas of peribronchovascular ground-glass attenuation and micronodularity are noted in the lungs bilaterally, particularly in the left upper lobe. In addition, there is a 13 x 9 mm (mean diameter of 11 mm) nodule in the left upper lobe (image 20 of series 407) which has slightly ill-defined margins, a large ground-glass attenuation component, as well as a large solid appearing component (solid component measures up to 12 mm in length). No acute consolidative airspace disease. No pleural effusions.  Musculoskeletal/Soft Tissues: There are no aggressive appearing lytic or blastic lesions noted in the visualized portions of the skeleton.  CTA ABDOMEN AND PELVIS FINDINGS  Hepatobiliary: No cystic or solid hepatic lesions. No intra or extrahepatic biliary ductal dilatation. Gallbladder is unremarkable in appearance.  Pancreas: No pancreatic mass. No pancreatic ductal dilatation. No pancreatic or peripancreatic fluid or inflammatory changes.  Spleen: Unremarkable.  Adrenals/Urinary Tract: 16 mm exophytic simple cyst in the lower pole of the right kidney. Multiple additional sub cm low-attenuation lesions are noted throughout the kidneys bilaterally, too small to definitively characterize, but favored to represent tiny cysts. Bilateral adrenal glands are normal in appearance. No hydroureteronephrosis. Urinary bladder is mildly trabeculated, but otherwise unremarkable in appearance.  Stomach/Bowel: Normal appearance of the stomach. No  pathologic dilatation of small bowel or colon. Normal appendix.  Vascular/Lymphatic: Atherosclerosis throughout the abdominal and pelvic vasculature, with vascular findings and measurements pertinent to potential TAVR procedure, as detailed below. No aneurysm or dissection noted. Single renal arteries bilaterally appear widely patent. Celiac axis, superior mesenteric artery and inferior mesenteric artery are all patent without definite hemodynamically significant stenosis. No lymphadenopathy noted in the abdomen or pelvis.  Reproductive: Median lobe hypertrophy in the prostate gland which is heterogeneously calcified. Seminal vesicles are unremarkable in appearance.  Other: No significant volume of ascites. No pneumoperitoneum.  Musculoskeletal: There are no aggressive appearing lytic or blastic lesions noted in the visualized portions of the skeleton.  VASCULAR MEASUREMENTS PERTINENT TO TAVR:  AORTA:  Minimal Aortic Diameter - 15 x 16 mm  Severity of Aortic Calcification - severe  RIGHT PELVIS:  Right Common Iliac Artery -  Minimal Diameter - 10.7 x 10.6 mm  Tortuosity - severe  Calcification - mild  Right External Iliac Artery -  Minimal Diameter - 9.4 x 9.2 mm  Tortuosity - moderate  Calcification - minimal  Right Common Femoral Artery -  Minimal Diameter - 8.2 x 9.5 mm  Tortuosity - mild-to-moderate  Calcification - mild  LEFT PELVIS:  Left Common Iliac Artery -  Minimal Diameter - 11.9 x 7.1 mm  Tortuosity - severe  Calcification - mild  Left External Iliac Artery -  Minimal Diameter - 8.4 x 8.2 mm  Tortuosity - moderate  Calcification - minimal  Left Common Femoral Artery -  Minimal Diameter - 8.4 x 6.9 mm  Tortuosity - mild  Calcification - mild  Review of the MIP images confirms the above findings.  IMPRESSION: 1. Vascular findings and measurements pertinent to potential TAVR procedure,  as detailed above. This patient does appear to have suitable pelvic arterial access in terms of vascular size, although there is extensive tortuosity, as discussed above. 2. Severe thickening calcification of the aortic valve, compatible with the reported clinical history of severe aortic stenosis. 3. Multiple areas of nodularity in the lungs bilaterally, most of which are nonspecific and favored to be benign. The one potentially concerning lesion is a 9 x 13 mm nodule (mean diameter of 11 mm) in the left upper lobe (image 20 of series 407) which has both ground-glass attenuation and solid components. Follow-up non-contrast CT recommended at 3 months to confirm persistence. If persistent these nodules should be considered highly suspicious if the solid component of the nodule is 6 mm or greater in size and enlarging. This recommendation follows the consensus statement: Guidelines for Management of Incidental Pulmonary Nodules Detected on CT Images:From the Fleischner Society 2017; published online before print (10.1148/radiol.IJ:2314499). 4. Additional incidental findings, as above.   Electronically Signed  By: Vinnie Langton M.D.  On: 08/01/2015 11:35       Procedure: AV Replacement + CAB  Risk of Mortality: 3.843%  Morbidity or Mortality: 19.216%  Long Length of Stay: 8.157%  Short Length of Stay: 25.431%  Permanent Stroke: 1.813%  Prolonged Ventilation: 10.127%  DSW Infection: 0.15%  Renal Failure: 4.939%  Reoperation: 9.578%   Impression:  This 82 year old gentleman has stage D severe symptomatic aortic stenosis with NYHA class 2 symptoms of exertional fatigue, shortness of breath and chest discomfort due to chronic diastolic congestive heart failure. His echo shows a trileaflet valve with severely calcified and poorly mobile leaflets with a mean gradient of 44 mm Hg. Cardiac cath shows a focal 60% mid LAD stenosis of unclear significance. He does have  exertional dull substernal chest aching that is likely due to the combination of severe AS with this LAD stenosis. The stenosis is certainly not high grade and could be treated medically for now and stented later if he has persistent exertional chest discomfort after AVR. I have personally reviewed his gated cardiac CT and his CTA of the chest, abdomen and pelvis. He has annular measurements suitable for a 29 mm Sapien 3 valve with no unfavorable anatomic findings. There is adequate pelvic access bilaterally for a transfemoral approach.  I think AVR is indicated in this patient with symptomatic severe AS. His STS PROM is about 4% and I think TAVR is a better option for him at 82 years of age. The patient and his wife and son were counseled at length regarding treatment alternatives for management of severe symptomatic aortic stenosis. Alternative approaches such as conventional aortic valve replacement, transcatheter aortic valve replacement, and palliative medical therapy were compared and contrasted at length. The risks associated with conventional surgical aortic valve replacement were been discussed in detail, as were expectations for post-operative convalescence. Long-term prognosis with medical therapy was discussed. He would like to proceed with TAVR evaluation.   We discussed complications that might develop including but not limited to risks of death, stroke, paravalvular leak, aortic dissection or other major vascular complications, aortic annulus rupture, device embolization, cardiac rupture or perforation, mitral regurgitation, acute myocardial infarction, arrhythmia, heart block or bradycardia requiring permanent pacemaker placement, congestive heart  failure, respiratory failure, renal failure, pneumonia, infection, other late complications related to structural valve deterioration or migration, or other complications that might ultimately cause a temporary or permanent loss of functional  independence or other long term morbidity. The patient provides full informed consent for the procedure as described and all questions were answered.       Plan:  Transfemoral TAVR on Tuesday 08/08/2015.    Gaye Pollack, MD

## 2015-08-08 NOTE — Progress Notes (Signed)
[facility] BRIEF SICU PROGRESS NOTE  Day of Surgery  S/P Procedure(s) (LRB): TRANSCATHETER AORTIC VALVE REPLACEMENT, TRANSFEMORAL (N/A) TRANSESOPHAGEAL ECHOCARDIOGRAM (TEE) (N/A)   Doing well NSR w/ stable BP Breathing comfortably w/ O2 sats 98-100% Groin looks okay  Plan: Continue routine early postop TAVR  Rexene Alberts, MD 08/08/2015 7:48 PM

## 2015-08-08 NOTE — Transfer of Care (Signed)
Immediate Anesthesia Transfer of Care Note  Patient: James Hardy  Procedure(s) Performed: Procedure(s): TRANSCATHETER AORTIC VALVE REPLACEMENT, TRANSFEMORAL (N/A) TRANSESOPHAGEAL ECHOCARDIOGRAM (TEE) (N/A)  Patient Location: SICU  Anesthesia Type:General  Level of Consciousness: awake, alert , oriented and patient cooperative  Airway & Oxygen Therapy: Patient Spontanous Breathing and Patient connected to nasal cannula oxygen  Post-op Assessment: Report given to RN, Post -op Vital signs reviewed and stable and Patient moving all extremities  Post vital signs: Reviewed and stable  Last Vitals:  Filed Vitals:   08/08/15 0607  BP: 154/64  Pulse: 55  Temp: 36.7 C  Resp: 20    Last Pain: There were no vitals filed for this visit.    Patients Stated Pain Goal: 3 ([id number])  Complications: No apparent anesthesia complications

## 2015-08-08 NOTE — Op Note (Signed)
[facility]   MULTIDISCIPLINARY HEART VALVE TEAM   TAVR OPERATIVE NOTE   Date of Procedure:  08/08/2015  Preoperative Diagnosis: Severe Aortic Stenosis   Postoperative Diagnosis: Same   Procedure:   Transcatheter Aortic Valve Replacement - Percutaneous 26 Transfemoral Approach  Edwards Sapien 3 THV (size 26 mm, model # 9600TFX, serial # [id number])   Co-Surgeons:  Gilford Raid, MD and Sherren Mocha, MD  Anesthesiologist:  Suella Broad MD  Echocardiographer:  Ena Dawley, MD  Pre-operative Echo Findings:  Severe aortic stenosis  Normal left ventricular systolic function  Post-operative Echo Findings:  Mild paravalvular leak  Normal left ventricular systolic function  BRIEF CLINICAL NOTE AND INDICATIONS FOR SURGERY The patient is an 82 year old gentleman with a history of heart murmur who has been followed by Dr. Wynonia Lawman since 2014 with moderate aortic stenosis. The patient says that over the past year he has developed symptoms of exertional fatigue, shortness of breath and chest discomfort that have worsened over the past 4-5 months. 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 He has been walking but has to stop after about 1/4 mile. His symptoms resolve within a few minutes of stopping. He describes the chest discomfort as a dull ache in the center of his chest. His most recent echo in 03/2015 showed progression to severe AS with a mean gradient of 44 mm Hg with an EF of 55%. There was trace MR. He underwent cath which showed a 60% focal stenosis in the mid LAD with normal right heart pressures. After review of all of the data, the Heart Team felt TAVR would be the best treatment for this elderly but functional patient.   During the course of the patient's preoperative work up they have been evaluated comprehensively by a multidisciplinary team of specialists  coordinated through the [facility] in the [facility].  They have been demonstrated to suffer from symptomatic severe aortic stenosis as noted above. The patient has been counseled extensively as to the relative risks and benefits of all options for the treatment of severe aortic stenosis including long term medical therapy, conventional surgery for aortic valve replacement, and transcatheter aortic valve replacement.  The patient has been independently evaluated by two cardiac surgeons including Dr. Cyndia Bent and Dr. Roxy Manns, and they are felt to be at high risk for conventional surgical aortic valve replacement based upon a predicted risk of mortality using the Society of Thor[employer] Surgeons risk calculator of 3.9%. Based upon review of all of the patient's preoperative diagnostic tests they are felt to be candidate for transcatheter aortic valve replacement using the transfemoral approach as an alternative to high risk conventional surgery.    Following the decision to proceed with transcatheter aortic valve replacement, a discussion has been held regarding what types of management strategies would be attempted intraoperatively in the event of life-threatening complications, including whether or not the patient would be considered a candidate for the use of cardiopulmonary bypass and/or conversion to open sternotomy for attempted surgical intervention.  The patient has been advised of a variety of complications that might develop peculiar to this approach including but not limited to risks of death, stroke, paravalvular leak, aortic dissection or other major vascular complications, aortic annulus rupture, device embolization, cardiac rupture or perforation, acute myocardial infarction, arrhythmia, heart block or bradycardia requiring permanent pacemaker placement, congestive  heart failure, respiratory failure, renal failure, pneumonia, infection, other late  complications related to structural valve deterioration or migration, or other complications that might ultimately cause a temporary or permanent loss of functional independence or other long term morbidity.  The patient provides full informed consent for the procedure as described and all questions were answered preoperatively.  DETAILS OF THE OPERATIVE PROCEDURE PREPARATION:    The patient is brought to the operating room on the above mentioned date and central monitoring was established by the anesthesia team including placement of Swan-Ganz catheter and radial arterial line. The patient is placed in the supine position on the operating table.  Intravenous antibiotics are administered. General endotracheal anesthesia is induced uneventfully.  A Foley catheter is placed.  Baseline transesophageal echocardiogram was performed. The patient's chest, abdomen, both groins, and both lower extremities are prepared and draped in a sterile manner. A time out procedure is performed.   PERIPHERAL ACCESS:    Using the modified Seldinger technique, femoral arterial and venous access was obtained with placement of 6 Fr sheaths on the left side.  A pigtail diagnostic catheter was passed through the left femoral arterial sheath under fluoroscopic guidance into the aortic root.  A temporary transvenous pacemaker catheter was passed through the left femoral venous sheath under fluoroscopic guidance into the right ventricle.  The pacemaker was tested to ensure stable lead placement and pacemaker capture. Aortic root angiography was performed in order to determine the optimal angiographic angle for valve deployment.   TRANSFEMORAL ACCESS:  A micropuncture technique is used to access the right femoral artery under fluoroscopic guidance.   2 Perclose devices are deployed at 10' and 2' positions to 'PreClose' the femoral artery. An 8 French sheath is placed and then an Amplatz Superstiff wire is advanced through the  sheath. This is changed out for a 14 French transfemoral E-Sheath after progressively dilating over the Superstiff wire.  An AL-2 catheter was used to direct a straight-tip exchange length wire across the native aortic valve into the left ventricle. This was exchanged out for a pigtail catheter and position was confirmed in the LV apex. Simultaneous LV and Ao pressures were recorded.  The pigtail catheter was exchanged for an Amplatz Extra-stiff wire in the LV apex.  Echocardiography was utilized to confirm appropriate wire position and no sign of entanglement in the mitral subvalvular apparatus.   BALLOON AORTIC VALVULOPLASTY:  Balloon aortic valvuloplasty was performed using a 23 mm valvuloplasty balloon.  Once optimal position was achieved, BAV was done under rapid ventricular pacing. The patient recovered well hemodynamically.    TRANSCATHETER HEART VALVE DEPLOYMENT:  An Edwards Sapien 3 transcatheter heart valve (size 26 mm, model #9600TFX, serial [id number]) was prepared and crimped per manufacturer's guidelines, and the proper orientation of the valve is confirmed on the [employer] delivery system. The valve was advanced through the introducer sheath using normal technique until in an appropriate position in the abdominal aorta beyond the sheath tip. The balloon was then retracted and using the fine-tuning wheel was centered on the valve. The valve was then advanced across the aortic arch using appropriate flexion of the catheter. The valve was carefully positioned across the aortic valve annulus. The Commander catheter was retracted using normal technique. Once final position of the valve has been confirmed by angiographic assessment, the valve is deployed while temporarily holding ventilation and during rapid ventricular pacing to maintain systolic blood pressure < 50 mmHg and pulse pressure < 10 mmHg. The balloon inflation is  held for >3 seconds after reaching full deployment volume. Once the  balloon has fully deflated the balloon is retracted into the ascending aorta and valve function is assessed using echocardiography. There is felt to be trace to mild paravalvular leak and no central aortic insufficiency.  The patient's hemodynamic recovery following valve deployment is good.  The deployment balloon and guidewire are both removed. Echo demostrated acceptable post-procedural gradients, stable mitral valve function, and trace-mild aortic insufficiency.   PROCEDURE COMPLETION:  The sheath was removed and femoral artery closure is performed using the 2 previously deployed Perclose devices.  Protamine was administered once femoral arterial repair was complete. The temporary pacemaker, pigtail catheters and femoral sheaths were removed with manual pressure used for hemostasis.   The patient tolerated the procedure well and is transported to the surgical intensive care in stable condition. There were no immediate intraoperative complications. All sponge instrument and needle counts are verified correct at completion of the operation.   The patient received a total of 60 mL of intravenous contrast during the procedure.  Sherren Mocha, MD 08/08/2015 11:08 AM

## 2015-08-08 NOTE — Anesthesia Procedure Notes (Addendum)
Central Venous Catheter Insertion Performed by: anesthesiologist 08/08/2015 7:05 AM Preanesthetic checklist: patient identified, IV checked, site marked, risks and benefits discussed, surgical consent, monitors and equipment checked, pre-op evaluation, timeout performed and anesthesia consent Position: Trendelenburg Lidocaine 1% used for infiltration Landmarks identified and Seldinger technique used Catheter size: 8.5 Fr Central line was placed.Sheath introducer Procedure performed using ultrasound guided technique. Attempts: 1 Following insertion, line sutured and dressing applied. Post procedure assessment: blood return through all ports, free fluid flow and no air. Patient tolerated the procedure well with no immediate complications.    Central Venous Catheter Insertion Performed by: anesthesiologist 08/08/2015 7:05 AM Preanesthetic checklist: patient identified, IV checked, site marked, risks and benefits discussed, surgical consent, monitors and equipment checked, pre-op evaluation, timeout performed and anesthesia consent Position: Trendelenburg Lidocaine 1% used for infiltration Landmarks identified and Seldinger technique used PA cath was placed.Swan type and PA catheter depth:thermodilutionProcedure performed using ultrasound guided technique.    Procedure Name: Intubation Date/Time: 08/08/2015 7:56 AM Performed by: Rebekah Chesterfield L Pre-anesthesia Checklist: Patient identified, Emergency Drugs available, Suction available and Patient being monitored Patient Re-evaluated:Patient Re-evaluated prior to inductionOxygen Delivery Method: Circle System Utilized Preoxygenation: Pre-oxygenation with 100% oxygen Intubation Type: IV induction Ventilation: Mask ventilation without difficulty Laryngoscope Size: Mac and 3 Grade View: Grade I Tube type: Oral Tube size: 7.5 mm Number of attempts: 1 Airway Equipment and Method: Stylet Placement Confirmation: ETT inserted through  vocal cords under direct vision,  positive ETCO2 and breath sounds checked- equal and bilateral Secured at: 20 cm Tube secured with: Tape Dental Injury: Teeth and Oropharynx as per pre-operative assessment

## 2015-08-08 NOTE — Progress Notes (Signed)
  Echocardiogram Echocardiogram Transesophageal has been performed.  James Hardy 08/08/2015, 10:05 AM

## 2015-08-09 ENCOUNTER — Inpatient Hospital Stay (HOSPITAL_COMMUNITY): Payer: Medicare Other

## 2015-08-09 ENCOUNTER — Encounter (HOSPITAL_COMMUNITY): Payer: Self-pay | Admitting: Cardiovascular Disease

## 2015-08-09 DIAGNOSIS — I340 Nonrheumatic mitral (valve) insufficiency: Secondary | ICD-10-CM

## 2015-08-09 DIAGNOSIS — I350 Nonrheumatic aortic (valve) stenosis: Principal | ICD-10-CM

## 2015-08-09 LAB — ECHOCARDIOGRAM COMPLETE
Height: 62 in
WEIGHTICAEL: 2240 [oz_av]

## 2015-08-09 LAB — BASIC METABOLIC PANEL
Anion gap: 7 (ref 5–15)
BUN: 15 mg/dL (ref 6–20)
CALCIUM: 7.8 mg/dL — AB (ref 8.9–10.3)
CO2: 25 mmol/L (ref 22–32)
CREATININE: 1.04 mg/dL (ref 0.61–1.24)
Chloride: 107 mmol/L (ref 101–111)
GFR calc Af Amer: >60 mL/min (ref >60)
GLUCOSE: 108 mg/dL — AB (ref 65–99)
POTASSIUM: 3.8 mmol/L (ref 3.5–5.1)
SODIUM: 139 mmol/L (ref 135–145)

## 2015-08-09 LAB — CBC
HCT: 29.3 % — ABNORMAL LOW (ref 39.0–52.0)
Hemoglobin: 9.3 g/dL — ABNORMAL LOW (ref 13.0–17.0)
MCH: 32.3 pg (ref 26.0–34.0)
MCHC: 31.7 g/dL (ref 30.0–36.0)
MCV: 101.7 fL — AB (ref 78.0–100.0)
PLATELETS: 162 10*3/uL (ref 150–400)
RBC: 2.88 MIL/uL — AB (ref 4.22–5.81)
RDW: 15.3 % (ref 11.5–15.5)
WBC: 10.5 10*3/uL (ref 4.0–10.5)

## 2015-08-09 LAB — MAGNESIUM: MAGNESIUM: 2 mg/dL (ref 1.7–2.4)

## 2015-08-09 MED ORDER — DOXAZOSIN MESYLATE 2 MG PO TABS
8.0000 mg | ORAL_TABLET | Freq: Every day | ORAL | Status: DC
  Administered 2015-08-09: 8 mg via ORAL
  Filled 2015-08-09: qty 4

## 2015-08-09 MED ORDER — PANTOPRAZOLE SODIUM 40 MG PO TBEC
40.0000 mg | DELAYED_RELEASE_TABLET | Freq: Once | ORAL | Status: AC
  Administered 2015-08-09: 40 mg via ORAL
  Filled 2015-08-09: qty 1

## 2015-08-09 MED ORDER — DOCUSATE SODIUM 100 MG PO CAPS
100.0000 mg | ORAL_CAPSULE | Freq: Two times a day (BID) | ORAL | Status: DC | PRN
  Administered 2015-08-09: 100 mg via ORAL
  Filled 2015-08-09: qty 1

## 2015-08-09 NOTE — Progress Notes (Signed)
CARDIAC REHAB PHASE I   PRE:  Rate/Rhythm: 60 SR  BP:  Sitting: 128/46        SaO2: 100 RA  MODE:  Ambulation: 350 ft   POST:  Rate/Rhythm: 71 SR  BP:  Sitting: 148/46         SaO2: 100 RA  Pt ambulated 350 ft on RA, hand held assist, mostly steady gait, tolerated well. Pt c/o weakness in his legs, denies any other complaints. Practiced and encouraged IS. Pt to recliner after walk, feet elevated, call bell within reach, family at bedside. Will follow.  Rives, RN, BSN 08/09/2015 2:41 PM

## 2015-08-09 NOTE — Progress Notes (Signed)
Patient tx to 2W07, patient placed on tele, patient in chair, call bell within reach, unit RN in room. Belongings at bedside along with family. No questions from RN or patient.  Rowe Pavy, RN

## 2015-08-09 NOTE — Progress Notes (Signed)
    Subjective:  Nausea overnight, feeling better this morning. No CP or dyspnea. Felt weak and jittery after walking this am.   Objective:  Vital Signs in the last 24 hours: Temp:  [94.5 F (34.7 C)-98.4 F (36.9 C)] 97.7 F (36.5 C) (05/17 0400) Pulse Rate:  [46-63] 54 (05/17 0700) Resp:  [0-19] 11 (05/17 0700) BP: (116-168)/(47-84) 140/58 mmHg (05/17 0700) SpO2:  [95 %-100 %] 100 % (05/17 0700) Arterial Line BP: (134-177)/(49-63) 174/60 mmHg (05/16 1700) Weight:  [140 lb (63.504 kg)] 140 lb (63.504 kg) (05/17 0400)  Intake/Output from previous day: 05/16 0701 - 05/17 0700 In: 2950.1 [P.O.:950; I.V.:1240.1; IV Piggyback:750] Out: 2540 [Urine:2490; Blood:50]  Physical Exam: Pt is alert and oriented, NAD HEENT: normal Neck: JVP - normal Lungs: CTA bilaterally CV: RRR with 2/6 early peaking systolic ejection murmur at the LSB Abd: soft, NT, Positive BS, no hepatomegaly Ext: no C/C/E, distal pulses intact and equal, bilateral groin sites clear Skin: warm/dry no rash  Lab Results:  Recent Labs  08/08/15 1605 08/09/15 0412  WBC 11.3* 10.5  HGB 9.0*  8.8* 9.3*  PLT 159 162    Recent Labs  08/08/15 1605 08/09/15 0412  NA 142 139  K 3.9 3.8  CL 106 107  CO2  --  25  GLUCOSE 89 108*  BUN 21* 15  CREATININE 1.08  1.00 1.04   No results for input(s): TROPONINI in the last 72 hours.  Invalid input(s): CK, MB  Tele: Sinus rhythm, sinus brady, few PVC's  Assessment/Plan:  1. Severe aortic stenosis POD#1 from TAVR - doing well. Mobilize, DC foley, tx 2W, start ASA and plavix, check POD #1 echo  2. Acute on chronic diastoilc CHF, related to #1, Class III symptoms - no volume overload on exam today. Follow clinically. Hold beta-blocker for bradycardia.   3. Anemia, post-op blood loss, mild - follow  4. Bradycardia - hold beta-blocker today  5. Nausea/vomiting - suspect post-anesthetic. zofran prn, abdominal exam benign   Sherren Mocha, M.D. 08/09/2015,  7:25 AM

## 2015-08-09 NOTE — Progress Notes (Signed)
1 Day Post-Op Procedure(s) (LRB): TRANSCATHETER AORTIC VALVE REPLACEMENT, TRANSFEMORAL (N/A) TRANSESOPHAGEAL ECHOCARDIOGRAM (TEE) (N/A) Subjective:  Had a good day. Walked three times. No complaints  Objective: Vital signs in last 24 hours: Temp:  [97.5 F (36.4 C)-98.4 F (36.9 C)] 98.2 F (36.8 C) (05/17 1411) Pulse Rate:  [46-63] 60 (05/17 1411) Cardiac Rhythm:  [-] Normal sinus rhythm;Heart block (05/17 1410) Resp:  [10-18] 18 (05/17 1411) BP: (114-156)/(47-84) 119/47 mmHg (05/17 1411) SpO2:  [95 %-100 %] 100 % (05/17 1552) Weight:  [63.504 kg (140 lb)] 63.504 kg (140 lb) (05/17 0400)  Hemodynamic parameters for last 24 hours:    Intake/Output from previous day: 05/16 0701 - 05/17 0700 In: 2950.1 [P.O.:950; I.V.:1240.1; IV Piggyback:750] Out: 2540 [Urine:2490; Blood:50] Intake/Output this shift: Total I/O In: 320 [P.O.:240; I.V.:80] Out: 1075 [Urine:1075]  General appearance: alert and cooperative Neurologic: intact Heart: regular rate and rhythm, S1, S2 normal, no murmur, click, rub or gallop Lungs: clear to auscultation bilaterally Extremities: extremities normal, atraumatic, no cyanosis or edema Wound: groin sites ok with tiny knot bilaterally.  Lab Results:  Recent Labs  08/08/15 1605 08/09/15 0412  WBC 11.3* 10.5  HGB 9.0*  8.8* 9.3*  HCT 28.1*  26.0* 29.3*  PLT 159 162   BMET:  Recent Labs  08/08/15 1605 08/09/15 0412  NA 142 139  K 3.9 3.8  CL 106 107  CO2  --  25  GLUCOSE 89 108*  BUN 21* 15  CREATININE 1.08  1.00 1.04  CALCIUM  --  7.8*    PT/INR:  Recent Labs  08/08/15 1000  LABPROT 16.8*  INR 1.36   ABG    Component Value Date/Time   PHART 7.351 08/08/2015 1001   HCO3 22.6 08/08/2015 1001   TCO2 23 08/08/2015 1605   ACIDBASEDEF 3.0* 08/08/2015 1001   O2SAT 98.0 08/08/2015 1001   CBG (last 3)  No results for input(s): GLUCAP in the last 72 hours.   *[facility]*  [address], [place] [address]  [phone]  ------------------------------------------------------------------- Transthoracic Echocardiography  Patient: Mattox, Buscaglia MR #: [id number] Study Date: 08/09/2015 Gender: M Age: 82 Height: 157.5 cm Weight: 63.5 kg BSA: 1.68 m^2 Pt. Status: Room: 2W07C  SONOGRAPHER Diamond Nickel ADMITTING Sherren Mocha, MD ATTENDING Sherren Mocha, MD ORDERING Sherren Mocha, MD REFERRING Sherren Mocha, MD PERFORMING [facility], Inpatient  cc:  ------------------------------------------------------------------- LV EF: 55% - 60%  ------------------------------------------------------------------- History: PMH: Sleep apnea. Post TAVR evaluation. Dyspnea. Risk factors: Dyslipidemia.  ------------------------------------------------------------------- Study Conclusions  - Left ventricle: The cavity size was normal. Systolic function was  normal. The estimated ejection fraction was in the range of 55%  to 60%. Wall motion was normal; there were no regional wall  motion abnormalities. - Aortic valve: Post TAVR. Mild eccentric aortic regurgitation  posterior location. Well seated. - Mitral valve: There was mild regurgitation. - Tricuspid valve: There was moderate regurgitation. - Pulmonary arteries: Systolic pressure was mildly increased. PA  peak pressure: 39 mm Hg (S).  Transthoracic echocardiography. M-mode, complete 2D, spectral Doppler, and color Doppler. Birthdate: Patient birthdate: 01-Feb-1929. Age: Patient is 82 yr old. Sex: Gender: male. BMI: 25.6 kg/m^2. Blood pressure: 119/47 Patient status: Inpatient. Study date: Study date: 08/09/2015. Study time: 03:51 PM. Location:  Bedside.  -------------------------------------------------------------------  ------------------------------------------------------------------- Left ventricle: The cavity size was normal. Systolic function was normal. The estimated ejection fraction was in the range of 55% to 60%. Wall motion was normal; there were no regional wall  motion abnormalities.  ------------------------------------------------------------------- Aortic valve: Post TAVR. Mild eccentric aortic regurgitation posterior location. Well seated. Doppler: VTI ratio of LVOT to aortic valve: 0.44. Indexed valve area (VTI): 0.67 cm^2/m^2. Peak velocity ratio of LVOT to aortic valve: 0.37. Indexed valve area (Vmax): 0.57 cm^2/m^2. Mean velocity ratio of LVOT to aortic valve: 0.41. Indexed valve area (Vmean): 0.62 cm^2/m^2. Mean gradient (S): 9 mm Hg. Peak gradient (S): 18 mm Hg.  ------------------------------------------------------------------- Aorta: Aortic root: The aortic root was normal in size.  ------------------------------------------------------------------- Mitral valve: Structurally normal valve. Mobility was not restricted. Doppler: Transvalvular velocity was within the normal range. There was no evidence for stenosis. There was mild regurgitation. Peak gradient (D): 4 mm Hg.  ------------------------------------------------------------------- Left atrium: The atrium was normal in size.  ------------------------------------------------------------------- Right ventricle: The cavity size was normal. Wall thickness was normal. Systolic function was normal.  ------------------------------------------------------------------- Pulmonic valve: Poorly visualized. Structurally normal valve. Cusp separation was normal. Doppler: Transvalvular velocity was within the normal range. There was no evidence for stenosis. There was no  regurgitation.  ------------------------------------------------------------------- Tricuspid valve: Structurally normal valve. Doppler: Transvalvular velocity was within the normal range. There was moderate regurgitation.  ------------------------------------------------------------------- Pulmonary artery: The main pulmonary artery was normal-sized. Systolic pressure was mildly increased.  ------------------------------------------------------------------- Right atrium: The atrium was normal in size.  ------------------------------------------------------------------- Pericardium: There was no pericardial effusion.  ------------------------------------------------------------------- Systemic veins: Inferior vena cava: The vessel was normal in size.  ------------------------------------------------------------------- Measurements  Left ventricle Value Reference LV ID, ED, PLAX chordal 43.7 mm 43 - 52 LV ID, ES, PLAX chordal 34.7 mm 23 - 38 LV fx shortening, PLAX chordal (L) 21 % >=29 LV PW thickness, ED 12.9 mm --------- IVS/LV PW ratio, ED 0.91 <=1.3 Stroke volume, 2D 53 ml --------- Stroke volume/bsa, 2D 32 ml/m^2 --------- LV e&', lateral 6.09 cm/s --------- LV E/e&', lateral 15.45 --------- LV e&', medial 7.18 cm/s --------- LV E/e&', medial 13.11 --------- LV e&', average 6.64 cm/s --------- LV E/e&', average 14.18 ---------  Ventricular septum Value Reference IVS thickness,  ED 11.7 mm ---------  LVOT Value Reference LVOT ID, S 18 mm --------- LVOT area 2.54 cm^2 --------- LVOT peak velocity, S 79.1 cm/s --------- LVOT mean velocity, S 55.9 cm/s --------- LVOT VTI, S 20.8 cm ---------  Aortic valve Value Reference Aortic valve peak velocity, S 212 cm/s --------- Aortic valve mean velocity, S 136 cm/s --------- Aortic valve VTI, S 46.8 cm --------- Aortic mean gradient, S 9 mm Hg --------- Aortic peak gradient, S 18 mm Hg --------- VTI ratio, LVOT/AV 0.44 --------- Aortic valve area/bsa, VTI 0.67 cm^2/m^2 --------- Velocity ratio, peak, LVOT/AV 0.37 --------- Aortic valve area/bsa, peak 0.57 cm^2/m^2 --------- velocity Velocity ratio, mean, LVOT/AV 0.41 --------- Aortic valve area/bsa, mean 0.62 cm^2/m^2 --------- velocity  Aorta Value Reference Aortic root ID, ED 20 mm ---------  Left atrium Value Reference LA ID, A-P, ES 42 mm --------- LA ID/bsa, A-P (H) 2.5 cm/m^2 <=2.2 LA volume, S 62.7 ml --------- LA volume/bsa, S 37.3 ml/m^2 --------- LA volume, ES, 1-p A4C 54.1 ml --------- LA volume/bsa, ES, 1-p A4C 32.2 ml/m^2  --------- LA volume, ES, 1-p A2C 71.1 ml --------- LA volume/bsa, ES, 1-p A2C 42.3 ml/m^2 ---------  Mitral valve Value Reference Mitral E-wave peak velocity 94.1 cm/s --------- Mitral A-wave peak velocity 112 cm/s --------- Mitral deceleration time (H) 285 ms 150 - 230 Mitral peak gradient, D 4 mm Hg --------- Mitral E/A ratio, peak 0.8 ---------  Pulmonary arteries Value Reference PA pressure, S, DP (H) 39 mm Hg <=  30  Tricuspid valve Value Reference Tricuspid regurg peak velocity 278 cm/s --------- Tricuspid peak RV-RA gradient 31 mm Hg ---------  Systemic veins Value Reference Estimated CVP 8 mm Hg ---------  Right ventricle Value Reference RV pressure, S, DP (H) 39 mm Hg <=30  Legend: (L) and (H) mark values outside specified reference range.  ------------------------------------------------------------------- Prepared and Electronically Authenticated by  Candee Furbish, M.D. 2017-05-17T17:04:40  Assessment/Plan: S/P Procedure(s) (LRB): TRANSCATHETER AORTIC VALVE REPLACEMENT, TRANSFEMORAL (N/A) TRANSESOPHAGEAL ECHOCARDIOGRAM (TEE) (N/A)  He has been hemodynamically stable in sinus rhythm. Echo personally reviewed and looks fine. Trivial AI and expected low gradient.  ASA and Plavix.  Beta blocker held due to sinus brady in the 50's this am. Now high 60's.  Continue mobilization. He may be ready to go home tomorrow.   LOS: 1 day    Gaye Pollack 08/09/2015

## 2015-08-10 MED ORDER — CLOPIDOGREL BISULFATE 75 MG PO TABS: 75.0000 mg | ORAL_TABLET | Freq: Every day | ORAL | Status: DC

## 2015-08-10 MED ORDER — TRAMADOL HCL 50 MG PO TABS: 50.0000 mg | ORAL_TABLET | ORAL | Status: DC | PRN

## 2015-08-10 NOTE — Care Management Note (Signed)
Case Management Note Previous CM note initiated by Elenor Quinones RN, CM  Patient Details  Name: James Hardy MRN: [id number] Date of Birth: [birth year]  Subjective/Objective:           Pt s/p TAVR         Action/Plan:   PTA - independent from home with wife.  CM will continue to monitor for disposition needs   Expected Discharge Date:     08/10/15             Expected Discharge Plan:  Home/Self Care  In-House Referral:     Discharge planning Services  CM Consult  Post Acute Care Choice:    Choice offered to:     DME Arranged:    DME Agency:     HH Arranged:    HH Agency:     Status of Service:  Completed, signed off  Medicare Important Message Given:    Date Medicare IM Given:    Medicare IM give by:    Date Additional Medicare IM Given:    Additional Medicare Important Message give by:     If discussed at [place] of Stay Meetings, dates discussed:    Additional Comments:  Dawayne Patricia, RN 08/10/2015, 10:31 AM

## 2015-08-10 NOTE — Progress Notes (Signed)
Pt/family given discharge instructions, medication lists, follow up appointments, and when to call the doctor.  Pt/family verbalizes understanding. Pt given signs and symptoms of infection. Re-iterated to family need to take it easy and continue walking and progressing activity.  Patient under impression that he could resume previous activity next week. Wife and son understand and will help with patient. Payton Emerald, RN

## 2015-08-10 NOTE — Discharge Instructions (Signed)
ACTIVITY AND EXERCISE °• Daily activity and exercise are an important part °of your recovery. People recover at different rates °depending on their general health and type of °valve procedure. °• Most people require six to 10 weeks to feel °recovered. °• No lifting, pushing, pulling more than 10 pounds °(examples to avoid: groceries, vacuuming, °gardening, golfing): °- For one week with a procedure through the groin. °- For six weeks for procedures through the chest °wall. °- For three months for procedures through the °breast-bone. °• After the initial healing process of the access site, °we recommend cardiac rehabilitation for all TAVR °patients. Cardiac rehabilitation will help you: °- Rebuild stamina, strength and balance. °- Learn how to participate in activities safely, as well °as help you regain confidence to do so. °- Return to activities of daily living and leisure. °• Discuss attending cardiac rehabilitation at your °follow-up appointment  ° °DRIVING °• Do not drive for four weeks after the date of your °procedure. °• If you have been told by your doctor in the past °that you may not drive, you must talk with him/her °before you begin driving again. °• When you resume driving, you must have someone °with you. ° °HYGIENE °If you had a femoral (leg) procedure, you may take a shower when you return home. After the shower, pat the °site dry. Do NOT use powder, oils or lotions in your groin area until the site has completely healed. °• If you had a chest procedure, you may shower when you return home unless specifically instructed not to by °your discharging practitioner. °- DO NOT scrub incision; pat dry with a towel °- DO NOT apply any lotions, oils, powders to the incision °- No tub baths / swimming for at least six weeks. ° °SITE CARE °• You likely will have small openings in both groins °from catheters used during the procedure. If you °had a transfemoral procedure, one groin will have a °larger opening  and may be bruised or tender. °• If you had a chest procedure, you will have either a °small incision in your upper sternum (breast-bone) °or between your ribs on your left side. °- Chest wall site: The surgical incision should be °kept dry (no lotions / oils / powders) and open °to air. If you experience irritation from clothing °rubbing on the incision, a light gauze dressing °may be applied. °- Inspect your incision daily; notify your °physician if there is increased redness, swelling °or drainage from the incision. °- If the incision is located on your breast-bone °you must avoid lifting objects heavier than a °gallon of milk (eight pounds) and stretching / °twisting / pulling with your arms for at least °three months to ensure strong bone healing. ° ° °CONTACT [phone] °• Check your sites daily. Contact our office if you have °any of the following problems: °- Redness and warmth that does not go away °- Yellow or green drainage from the wound °- Fever and chills °- Increasing numbness in your legs °- Worsening pain at the site °• If you had a leg/groin procedure, it is normal to °have bruising or a soft lump at the site. It is not °normal if the lump suddenly becomes larger or °more firm. This may mean you are bleeding. If this °happens: °- Lie down °- Have someone press down hard, just above °the hole in your skin where the procedure was °performed for 15 minutes. If after holding on the °site, the lump does not become larger or harder, °they   are performing this correctly. °- If the bleeding has stopped after 15 minutes, rest °and stay laying down for at least two hours. °- If the bleeding continues, call 911 for an °ambulance. Do NOT drive yourself or have °someone else drive you. °

## 2015-08-10 NOTE — Progress Notes (Signed)
2 Days Post-Op Procedure(s) (LRB): TRANSCATHETER AORTIC VALVE REPLACEMENT, TRANSFEMORAL (N/A) TRANSESOPHAGEAL ECHOCARDIOGRAM (TEE) (N/A) Subjective:  No complaints. Ambulated well yesterday and has been ambulating in room this morning. His wife is with him.  Objective: Vital signs in last 24 hours: Temp:  [97.7 F (36.5 C)-98.9 F (37.2 C)] 97.7 F (36.5 C) (05/18 0357) Pulse Rate:  [58-69] 66 (05/18 0357) Cardiac Rhythm:  [-] Normal sinus rhythm;Heart block (05/18 0700) Resp:  [11-18] 18 (05/18 0357) BP: (114-147)/(47-62) 142/60 mmHg (05/18 0357) SpO2:  [97 %-100 %] 98 % (05/18 0357) Weight:  [62.7 kg (138 lb 3.7 oz)] 62.7 kg (138 lb 3.7 oz) (05/18 0357)  Hemodynamic parameters for last 24 hours:    Intake/Output from previous day: 05/17 0701 - 05/18 0700 In: 730 [P.O.:600; I.V.:80; IV Piggyback:50] Out: [id number] [Urine:1825] Intake/Output this shift:    General appearance: alert and cooperative Neurologic: intact Heart: regular rate and rhythm, S1, S2 normal, no murmur, click, rub or gallop Lungs: clear to auscultation bilaterally Wound: groins look ok with tiny knot at both catheter sites.  Lab Results:  Recent Labs  08/08/15 1605 08/09/15 0412  WBC 11.3* 10.5  HGB 9.0*  8.8* 9.3*  HCT 28.1*  26.0* 29.3*  PLT 159 162   BMET:  Recent Labs  08/08/15 1605 08/09/15 0412  NA 142 139  K 3.9 3.8  CL 106 107  CO2  --  25  GLUCOSE 89 108*  BUN 21* 15  CREATININE 1.08  1.00 1.04  CALCIUM  --  7.8*    PT/INR:  Recent Labs  08/08/15 1000  LABPROT 16.8*  INR 1.36   ABG    Component Value Date/Time   PHART 7.351 08/08/2015 1001   HCO3 22.6 08/08/2015 1001   TCO2 23 08/08/2015 1605   ACIDBASEDEF 3.0* 08/08/2015 1001   O2SAT 98.0 08/08/2015 1001   CBG (last 3)  No results for input(s): GLUCAP in the last 72 hours.  Assessment/Plan: S/P Procedure(s) (LRB): TRANSCATHETER AORTIC VALVE REPLACEMENT, TRANSFEMORAL (N/A) TRANSESOPHAGEAL ECHOCARDIOGRAM  (TEE) (N/A)  He is doing well POD 2. HR is 65 this am. He was on Toprol 25 prior to surgery with HR 60 with LBBB and 1st degree AV block. It would probably be best to leave him off beta blocker for now.  I think he can go home today after walking the halls. I will see him back in a week to check his groins and be sure he is doing well and then he can return in one month with echo to see Dr. Burt Knack.   LOS: 2 days    Gaye Pollack 08/10/2015

## 2015-08-10 NOTE — Progress Notes (Signed)
CARDIAC REHAB PHASE I   Pt seen in hallway, ambulating independently with wife, no complaints. Cardiac surgery discharge education completed with pt, pt wife and son at bedside. Reviewed IS, activity progression, incision care, exercise guidelines, heart healthy diet, and phase 2 cardiac rehab. Pt verbalized understanding, receptive to education. Pt agrees to phase 2 cardiac rehab referral, will send to [facility] per pt request. Pt in chair, family at bedside, call bell within reach, awaiting discharge.    OI:[id number] Lenna Sciara, RN, BSN 08/10/2015 10:12 AM

## 2015-08-10 NOTE — Progress Notes (Signed)
    Subjective:  Feels great. Eager to go home. No CP or dyspnea. Ambulated this am without problems.  Objective:  Vital Signs in the last 24 hours: Temp:  [97.7 F (36.5 C)-98.9 F (37.2 C)] 97.7 F (36.5 C) (05/18 0357) Pulse Rate:  [58-69] 66 (05/18 0357) Resp:  [11-18] 18 (05/18 0357) BP: (114-147)/(47-62) 142/60 mmHg (05/18 0357) SpO2:  [97 %-100 %] 98 % (05/18 0357) Weight:  [138 lb 3.7 oz (62.7 kg)] 138 lb 3.7 oz (62.7 kg) (05/18 0357)  Intake/Output from previous day: 05/17 0701 - 05/18 0700 In: 730 [P.O.:600; I.V.:80; IV Piggyback:50] Out: [id number] [Urine:1825]  Physical Exam: Pt is alert and oriented, NAD HEENT: normal Neck: JVP - normal, carotids 2+= without bruits Lungs: CTA bilaterally CV: RRR with soft SEM at the RUSB, no diastolic murmur Abd: soft, NT, Positive BS, no hepatomegaly Ext: no C/C/E, distal pulses intact and equal Skin: warm/dry no rash   Lab Results:  Recent Labs  08/08/15 1605 08/09/15 0412  WBC 11.3* 10.5  HGB 9.0*  8.8* 9.3*  PLT 159 162    Recent Labs  08/08/15 1605 08/09/15 0412  NA 142 139  K 3.9 3.8  CL 106 107  CO2  --  25  GLUCOSE 89 108*  BUN 21* 15  CREATININE 1.08  1.00 1.04   No results for input(s): TROPONINI in the last 72 hours.  Invalid input(s): CK, MB  Cardiac Studies: POD #1 Echo: Left ventricle: The cavity size was normal. Systolic function was normal. The estimated ejection fraction was in the range of 55% to 60%. Wall motion was normal; there were no regional wall motion abnormalities.  ------------------------------------------------------------------- Aortic valve: Post TAVR. Mild eccentric aortic regurgitation posterior location. Well seated. Doppler: VTI ratio of LVOT to aortic valve: 0.44. Indexed valve area (VTI): 0.67 cm^2/m^2. Peak velocity ratio of LVOT to aortic valve: 0.37. Indexed valve area (Vmax): 0.57 cm^2/m^2. Mean velocity ratio of LVOT to aortic valve: 0.41. Indexed  valve area (Vmean): 0.62 cm^2/m^2. Mean gradient (S): 9 mm Hg. Peak gradient (S): 18 mm Hg.  ------------------------------------------------------------------- Aorta: Aortic root: The aortic root was normal in size.  ------------------------------------------------------------------- Mitral valve: Structurally normal valve. Mobility was not restricted. Doppler: Transvalvular velocity was within the normal range. There was no evidence for stenosis. There was mild regurgitation. Peak gradient (D): 4 mm Hg.  ------------------------------------------------------------------- Left atrium: The atrium was normal in size.  ------------------------------------------------------------------- Right ventricle: The cavity size was normal. Wall thickness was normal. Systolic function was normal.  ------------------------------------------------------------------- Pulmonic valve: Poorly visualized. Structurally normal valve. Cusp separation was normal. Doppler: Transvalvular velocity was within the normal range. There was no evidence for stenosis. There was no regurgitation.  ------------------------------------------------------------------- Tricuspid valve: Structurally normal valve. Doppler: Transvalvular velocity was within the normal range. There was moderate regurgitation.  ------------------------------------------------------------------- Pulmonary artery: The main pulmonary artery was normal-sized. Systolic pressure was mildly increased.  ------------------------------------------------------------------- Right atrium: The atrium was normal in size.  ------------------------------------------------------------------- Pericardium: There was no pericardial effusion.   Tele: Personally reviewed. Sinus rhythm  Assessment/Plan:  Doing very well POD#2 from TAVR. Stable on current Rx. FU as planned per Dr Cyndia Bent. Discussed restrictions with  patient and wife at length. Echo reviewed with normal valve function and trivial-mild PVL, no diastolic murmur on exam.   Sherren Mocha, M.D. 08/10/2015, 8:03 AM

## 2015-08-10 NOTE — Progress Notes (Signed)
Pt. found on CPAP, able to place self on/off on own, tolerating well, RT to monitor.

## 2015-08-10 NOTE — Discharge Summary (Signed)
Physician Discharge Summary  [name] MRN: [id number] DOB/AGE: [birth year] 82 y.o.  Admit date: 08/08/2015 Discharge date: 08/10/2015  Admission Diagnoses:  Patient Active Problem List   Diagnosis Date Noted  . Severe aortic stenosis   . Aortic stenosis   . Angina pectoris ([place])   . Hyperlipidemia 04/11/2009  . GERD 12/02/2008  . Sleep apnea 08/21/2007  . BPH (benign prostatic hypertrophy) 08/21/2007   Discharge Diagnoses:   Patient Active Problem List   Diagnosis Date Noted  . Severe aortic stenosis   . Aortic stenosis   . Angina pectoris ([place])   . Hyperlipidemia 04/11/2009  . GERD 12/02/2008  . Sleep apnea 08/21/2007  . BPH (benign prostatic hypertrophy) 08/21/2007   Discharged Condition: good  History of Present Illness:  James Hardy is an 82 year old gentleman with a history of heart murmur who has been followed by Dr. Wynonia Lawman since 2014 with moderate aortic stenosis. The patient says that over the past year he has developed symptoms of exertional fatigue, shortness of breath and chest discomfort that have worsened over the past 4-5 months. 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 He has been walking but has to stop after about 1/4 mile. His symptoms resolve within a few minutes of stopping. He describes the chest discomfort as a dull ache in the center of his chest. His most recent echo in 03/2015 showed progression to severe AS with a mean gradient of 44 mm Hg with an EF of 55%. There was trace MR. He was referred to Dr. Burt Knack and cath showed a 60% focal stenosis in the mid LAD with normal right heart pressures.  He was referred for evaluation in the TAVR clinic.  Workup included a heart catheterization performed by Dr. Burt Knack which showed a focal 60% stenosis in the mid LAD.  He was evaluated by Dr. Cyndia Bent who felt his LAD disease could be managed medically, but the  patient would benefit from intervention on his Aortic Stenosis.  He was felt to be a candidate for TAVR procedure.  The risks and benefits of the procedure were explained to the patient and he was agreeable to proceed.  Hospital Course:   James Hardy presented to [facility] on 08/08/2015.  He was taken to the operating room and underwent Transcatheter Aortic Valve Replacement via a Percutaneous Right Transfemoral approach.  He tolerated the procedure without difficulty and was taken to the SICU in stable condition.  During his stay in the SICU the patient was extubated.  He was started on Plavix on POD #1.  He was not restarted on home Beta Blocker due to bradycardia.  He was stable for transfer to the telemetry unit on POD #1.  The patient had follow up ECHO performed which was acceptable post TAVR and the valve appeared to be well seated.  He continued to make progress.  He continues to have bradycardia and will not be discharged home on his beta blocker.  His groin wound looks good.  He is ambulating independently.  He is tolerating a heart healthy diet. He is felt medically stable for discharge home today.       Significant Diagnostic Studies: cardiac graphics:   Echocardiogram:   - Left ventricle: The cavity size was normal. Systolic function was  normal. The estimated ejection fraction was in the range of 55%  to 60%. Wall  motion was normal; there were no regional wall  motion abnormalities. - Aortic valve: Post TAVR. Mild eccentric aortic regurgitation  posterior location. Well seated. - Mitral valve: There was mild regurgitation. - Tricuspid valve: There was moderate regurgitation. - Pulmonary arteries: Systolic pressure was mildly increased. PA  peak pressure: 39 mm Hg (S).  Treatments: surgery:    Transcatheter Aortic Valve Replacement - Percutaneous Right Transfemoral Approach Edwards Sapien 3 THV (size 26 mm, model # 9600TFX, serial #  [id number])  Disposition: 01-Home or Self Care   Discharge medications:     Medication List    STOP taking these medications        metoprolol succinate 25 MG 24 hr tablet  Commonly known as:  TOPROL-XL      TAKE these medications        aspirin 81 MG tablet  Take 81 mg by mouth daily.     calcium-vitamin D 250-125 MG-UNIT tablet  Commonly known as:  OSCAL WITH D  Take 2 tablets by mouth 2 (two) times daily.     chlorhexidine 0.12 % solution  Commonly known as:  PERIDEX  Use as directed 5 mLs in the mouth or throat 2 (two) times daily.     clopidogrel 75 MG tablet  Commonly known as:  PLAVIX  Take 1 tablet (75 mg total) by mouth daily with breakfast.     doxazosin 8 MG tablet  Commonly known as:  CARDURA  Take 8 mg by mouth at bedtime.     finasteride 5 MG tablet  Commonly known as:  PROSCAR  Take 5 mg by mouth daily.     Fish Oil 1000 MG Caps  Take 1,000 mg by mouth daily.     glucosamine-chondroitin 500-400 MG tablet  Take 2 tablets by mouth 2 (two) times daily.     multivitamin tablet  Take 1 tablet by mouth daily.     NON FORMULARY  CPAP machine     omeprazole 20 MG capsule  Commonly known as:  PRILOSEC  TAKE 1 CAPSULE (20 MG TOTAL) BY MOUTH DAILY.     PRO-BIOTIC BLEND Caps  Take 1 capsule by mouth daily as needed (digestive issues).     tamsulosin 0.4 MG Caps capsule  Commonly known as:  FLOMAX  Take 0.4 mg by mouth daily.     traMADol 50 MG tablet  Commonly known as:  ULTRAM  Take 1-2 tablets (50-100 mg total) by mouth every 4 (four) hours as needed for moderate pain.     Vitamin D3 5000 units Tabs  Take 5,000 Units by mouth daily.       Follow-up Information    Follow up with Gaye Pollack, MD On 08/16/2015.   Specialty:  Cardiothoracic Surgery   Why:  Appointment is at 1:45   Contact information:   [place] [address] [phone]       Follow up with Sherren Mocha, MD On 09/07/2015.   Specialty:   Cardiology   Why:  Appointment is at 10:30   Contact information:   [id number] [place] [address] [phone]       Follow up with [facility] ECHO LAB On 09/07/2015.   Specialty:  Cardiology   Why:  Appointment is at 9:30 for ECHO   Contact information:   [address] [id number] [place] [phone]      Signed: Ellwood Handler 08/10/2015, 10:30 AM

## 2015-08-11 LAB — TYPE AND SCREEN
ABO/RH(D): A POS
Antibody Screen: NEGATIVE
UNIT DIVISION: 0
Unit division: 0

## 2015-08-16 ENCOUNTER — Ambulatory Visit (INDEPENDENT_AMBULATORY_CARE_PROVIDER_SITE_OTHER): Payer: Medicare Other | Admitting: Surgery

## 2015-08-16 ENCOUNTER — Encounter: Payer: Self-pay | Admitting: Surgery

## 2015-08-16 VITALS — BP 155/71 | HR 69 | Resp 16 | Ht 62.0 in | Wt 141.6 lb

## 2015-08-16 DIAGNOSIS — I350 Nonrheumatic aortic (valve) stenosis: Secondary | ICD-10-CM | POA: Diagnosis not present

## 2015-08-16 DIAGNOSIS — Z953 Presence of xenogenic heart valve: Secondary | ICD-10-CM

## 2015-08-16 DIAGNOSIS — Z954 Presence of other heart-valve replacement: Secondary | ICD-10-CM | POA: Diagnosis not present

## 2015-08-16 NOTE — Progress Notes (Signed)
HPI: Patient returns for routine postoperative follow-up having undergone right transfemoral TAVR  on 08/08/2015. The patient's early postoperative recovery while in the hospital was notable for an uncomplicated postop course. Since hospital discharge the patient reports that he has been feeling well with a marked improvement in his stamina and no shortness of breath or chest pain with ambulation.   Current Outpatient Prescriptions  Medication Sig Dispense Refill  . aspirin 81 MG tablet Take 81 mg by mouth daily.    . Calcium Citrate 250 MG TABS Take 500 mg by mouth 2 (two) times daily.    . chlorhexidine (PERIDEX) 0.12 % solution Use as directed 5 mLs in the mouth or throat 2 (two) times daily.     . Cholecalciferol (VITAMIN D3) 5000 units TABS Take 5,000 Units by mouth daily.    . clopidogrel (PLAVIX) 75 MG tablet Take 1 tablet (75 mg total) by mouth daily with breakfast. 30 tablet 3  . doxazosin (CARDURA) 8 MG tablet Take 8 mg by mouth at bedtime.    . finasteride (PROSCAR) 5 MG tablet Take 5 mg by mouth daily.    Marland Kitchen glucosamine-chondroitin 500-400 MG tablet Take 2 tablets by mouth 2 (two) times daily.    . Multiple Vitamin (MULTIVITAMIN) tablet Take 1 tablet by mouth daily.    . NON FORMULARY CPAP machine    . Omega-3 Fatty Acids (FISH OIL) 1000 MG CAPS Take 1,000 mg by mouth daily.    . Probiotic Product (PRO-BIOTIC BLEND) CAPS Take 1 capsule by mouth daily as needed (digestive issues).     . ranitidine (ZANTAC) 75 MG tablet Take 75 mg by mouth 2 (two) times daily.    . tamsulosin (FLOMAX) 0.4 MG CAPS capsule Take 0.4 mg by mouth daily.     . traMADol (ULTRAM) 50 MG tablet Take 1-2 tablets (50-100 mg total) by mouth every 4 (four) hours as needed for moderate pain. (Patient not taking: Reported on 08/16/2015) 30 tablet 0   No current facility-administered medications for this visit.    Physical Exam: BP 155/71 mmHg  Pulse 69  Resp 16  Ht 5\' 2"  (1.575 m)  Wt 141 lb 9.6 oz  (64.229 kg)  BMI 25.89 kg/m2  SpO2 98% He looks well. Lung exam is clear. Cardiac exam shows a regular rate and rhythm with normal heart sounds. The groin sites look good.  Diagnostic Tests:  CLINICAL DATA: 82 year old male with history of severe aortic stenosis. Preprocedural study prior to potential transcatheter aortic valve replacement (TAVR) procedure.  EXAM: CT ANGIOGRAPHY CHEST, ABDOMEN AND PELVIS  TECHNIQUE: Multidetector CT imaging through the chest, abdomen and pelvis was performed using the standard protocol during bolus administration of intravenous contrast. Multiplanar reconstructed images and MIPs were obtained and reviewed to evaluate the vascular anatomy.  CONTRAST: 75 mL of Isovue 370.  COMPARISON: Chest CT 05/02/2008.  FINDINGS: CTA CHEST FINDINGS  Mediastinum/Lymph Nodes: Heart size is normal. There is no significant pericardial fluid, thickening or pericardial calcification. There is atherosclerosis of the thoracic aorta, the great vessels of the mediastinum and the coronary arteries, including calcified atherosclerotic plaque in the left anterior descending coronary artery. Severe calcifications and thickening of the aortic valve. No pathologically enlarged mediastinal or hilar lymph nodes. Densely calcified right hilar lymph node incidentally noted. Small hiatal hernia. No axillary lymphadenopathy.  Lungs/Pleura: 7 x 5 mm (mean diameter 6 mm) pleural-based nodule in the inferior segment of the lingula is unchanged compared to prior study 05/02/2008,  presumably a focal area of scarring. A few other scattered areas of peribronchovascular ground-glass attenuation and micronodularity are noted in the lungs bilaterally, particularly in the left upper lobe. In addition, there is a 13 x 9 mm (mean diameter of 11 mm) nodule in the left upper lobe (image 20 of series 407) which has slightly ill-defined margins, a large ground-glass attenuation  component, as well as a large solid appearing component (solid component measures up to 12 mm in length). No acute consolidative airspace disease. No pleural effusions.  Musculoskeletal/Soft Tissues: There are no aggressive appearing lytic or blastic lesions noted in the visualized portions of the skeleton.  CTA ABDOMEN AND PELVIS FINDINGS  Hepatobiliary: No cystic or solid hepatic lesions. No intra or extrahepatic biliary ductal dilatation. Gallbladder is unremarkable in appearance.  Pancreas: No pancreatic mass. No pancreatic ductal dilatation. No pancreatic or peripancreatic fluid or inflammatory changes.  Spleen: Unremarkable.  Adrenals/Urinary Tract: 16 mm exophytic simple cyst in the lower pole of the right kidney. Multiple additional sub cm low-attenuation lesions are noted throughout the kidneys bilaterally, too small to definitively characterize, but favored to represent tiny cysts. Bilateral adrenal glands are normal in appearance. No hydroureteronephrosis. Urinary bladder is mildly trabeculated, but otherwise unremarkable in appearance.  Stomach/Bowel: Normal appearance of the stomach. No pathologic dilatation of small bowel or colon. Normal appendix.  Vascular/Lymphatic: Atherosclerosis throughout the abdominal and pelvic vasculature, with vascular findings and measurements pertinent to potential TAVR procedure, as detailed below. No aneurysm or dissection noted. Single renal arteries bilaterally appear widely patent. Celiac axis, superior mesenteric artery and inferior mesenteric artery are all patent without definite hemodynamically significant stenosis. No lymphadenopathy noted in the abdomen or pelvis.  Reproductive: Median lobe hypertrophy in the prostate gland which is heterogeneously calcified. Seminal vesicles are unremarkable in appearance.  Other: No significant volume of ascites. No pneumoperitoneum.  Musculoskeletal: There are no  aggressive appearing lytic or blastic lesions noted in the visualized portions of the skeleton.  VASCULAR MEASUREMENTS PERTINENT TO TAVR:  AORTA:  Minimal Aortic Diameter - 15 x 16 mm  Severity of Aortic Calcification - severe  RIGHT PELVIS:  Right Common Iliac Artery -  Minimal Diameter - 10.7 x 10.6 mm  Tortuosity - severe  Calcification - mild  Right External Iliac Artery -  Minimal Diameter - 9.4 x 9.2 mm  Tortuosity - moderate  Calcification - minimal  Right Common Femoral Artery -  Minimal Diameter - 8.2 x 9.5 mm  Tortuosity - mild-to-moderate  Calcification - mild  LEFT PELVIS:  Left Common Iliac Artery -  Minimal Diameter - 11.9 x 7.1 mm  Tortuosity - severe  Calcification - mild  Left External Iliac Artery -  Minimal Diameter - 8.4 x 8.2 mm  Tortuosity - moderate  Calcification - minimal  Left Common Femoral Artery -  Minimal Diameter - 8.4 x 6.9 mm  Tortuosity - mild  Calcification - mild  Review of the MIP images confirms the above findings.  IMPRESSION: 1. Vascular findings and measurements pertinent to potential TAVR procedure, as detailed above. This patient does appear to have suitable pelvic arterial access in terms of vascular size, although there is extensive tortuosity, as discussed above. 2. Severe thickening calcification of the aortic valve, compatible with the reported clinical history of severe aortic stenosis. 3. Multiple areas of nodularity in the lungs bilaterally, most of which are nonspecific and favored to be benign. The one potentially concerning lesion is a 9 x 13 mm nodule (mean diameter of  11 mm) in the left upper lobe (image 20 of series 407) which has both ground-glass attenuation and solid components. Follow-up non-contrast CT recommended at 3 months to confirm persistence. If persistent these nodules should be considered highly suspicious if the solid component of the  nodule is 6 mm or greater in size and enlarging. This recommendation follows the consensus statement: Guidelines for Management of Incidental Pulmonary Nodules Detected on CT Images:From the Fleischner Society 2017; published online before print (10.1148/radiol.IJ:2314499). 4. Additional incidental findings, as above.   Electronically Signed  By: Vinnie Langton M.D.  On: 08/01/2015 11:35   Impression:  He is doing very well following his TAVR and feels much better than he has in a long time. He does have a 9 x 13 mm mixed ground glass and solid nodule in the LUL that could be a small lung cancer. I will see him back in three months for a repeat CT scan to follow up on this lesion.  Plan:  He has an appt with Dr. Burt Knack in a couple weeks with an echo for his one month follow up. I will see him in 3 months with a chest CT.   Gaye Pollack, MD [facility] [phone]

## 2015-08-18 ENCOUNTER — Ambulatory Visit (INDEPENDENT_AMBULATORY_CARE_PROVIDER_SITE_OTHER): Payer: Medicare Other | Admitting: Family Medicine

## 2015-08-18 ENCOUNTER — Encounter: Payer: Self-pay | Admitting: Family Medicine

## 2015-08-18 VITALS — BP 146/76 | HR 64 | Temp 97.7°F | Resp 18 | Wt 142.0 lb

## 2015-08-18 DIAGNOSIS — D7589 Other specified diseases of blood and blood-forming organs: Secondary | ICD-10-CM | POA: Diagnosis not present

## 2015-08-18 DIAGNOSIS — R911 Solitary pulmonary nodule: Secondary | ICD-10-CM | POA: Diagnosis not present

## 2015-08-18 DIAGNOSIS — D649 Anemia, unspecified: Secondary | ICD-10-CM | POA: Diagnosis not present

## 2015-08-18 DIAGNOSIS — K219 Gastro-esophageal reflux disease without esophagitis: Secondary | ICD-10-CM

## 2015-08-18 LAB — CBC WITH DIFFERENTIAL/PLATELET
BASOS PCT: 0.5 % (ref 0.0–3.0)
Basophils Absolute: 0.1 10*3/uL (ref 0.0–0.1)
EOS PCT: 0.9 % (ref 0.0–5.0)
Eosinophils Absolute: 0.1 10*3/uL (ref 0.0–0.7)
HCT: 30 % — ABNORMAL LOW (ref 39.0–52.0)
HEMOGLOBIN: 10.1 g/dL — AB (ref 13.0–17.0)
LYMPHS ABS: 1.6 10*3/uL (ref 0.7–4.0)
Lymphocytes Relative: 16.5 % (ref 12.0–46.0)
MCHC: 33.6 g/dL (ref 30.0–36.0)
MCV: 98.6 fl (ref 78.0–100.0)
MONO ABS: 1.6 10*3/uL — AB (ref 0.1–1.0)
Monocytes Relative: 16.2 % — ABNORMAL HIGH (ref 3.0–12.0)
NEUTROS ABS: 6.5 10*3/uL (ref 1.4–7.7)
NEUTROS PCT: 65.9 % (ref 43.0–77.0)
Platelets: 205 10*3/uL (ref 150.0–400.0)
RBC: 3.05 Mil/uL — ABNORMAL LOW (ref 4.22–5.81)
RDW: 16 % — AB (ref 11.5–15.5)
WBC: 9.8 10*3/uL (ref 4.0–10.5)

## 2015-08-18 LAB — VITAMIN B12: VITAMIN B 12: 942 pg/mL — AB (ref 211–911)

## 2015-08-18 MED ORDER — PANTOPRAZOLE SODIUM 40 MG PO TBEC
40.0000 mg | DELAYED_RELEASE_TABLET | Freq: Every day | ORAL | Status: DC
Start: 2015-08-18 — End: 2015-11-15

## 2015-08-18 NOTE — Patient Instructions (Signed)
Make sure you get a call about repeat chest CT in 3 months Continue with Zantac- if not adequate relief can go to Protonix.

## 2015-08-18 NOTE — Progress Notes (Signed)
Subjective:    [name], male    DOB: 08/09/1928, 82 y.o.   MRN: [id number]  HPI Patient is here following recent aortic valve replacement. Severe aortic stenosis and patient was becoming much more symptomatic. On May 16 he underwent transcatheter aortic valve replacement and did well following surgery. He was discharged on Plavix. He is doing much better since surgery with much less dyspnea and fatigue  Long history of reflux and had been on omeprazole. This was discontinued because of potential interaction with Plavix. He started over-the-counter Zantac 75 mg twice daily with poor control. Yesterday he increased this to Zantac 150 mg and symptoms are improved today.  Patient had some preoperative anemia and has had long-standing anemia with hemoglobin 11 range with mild macrocytosis. Chronic PPI use. No history of gastric surgery. Patient is not a vegetarian. No recent B12 level.  Patient states he had "staph " colonized intranasally prior to surgery. He is apparently still using Bactroban intranasal twice daily  Pt had CT angiogram prior to surgery with 9 X13 mm nodule LUL.  Never smoked.  Recommended 3 month follow up.  Past Medical History  Diagnosis Date  . HYPERLIPIDEMIA 04/11/2009    Qualifier: Diagnosis of  By: Joyce Gross    . CERUMEN IMPACTION 04/11/2009    Qualifier: Diagnosis of  By: Joyce Gross    . INTERNAL HEMORRHOIDS 08/28/2006    Qualifier: Diagnosis of  By: Jerral Ralph    . APHTHOUS ULCERS 12/19/2009    Qualifier: Diagnosis of  By: [name] MD, Jader Desai    . ANGULAR CHEILITIS 04/11/2009    Qualifier: Diagnosis of  By: Joyce Gross    . GERD 12/02/2008    Qualifier: Diagnosis of  By: [name] MD, Daylan Juhnke    . DIVERTICULOSIS, COLON 08/28/2006    Qualifier: Diagnosis of  By: Jerral Ralph    . OSTEOPOROSIS 08/21/2007    Qualifier: Diagnosis of  By: Jerral Ralph    . SLEEP APNEA 08/21/2007    Qualifier: Diagnosis of  By:  Jerral Ralph    . COLONIC POLYPS, HX OF 12/02/2008    Qualifier: Diagnosis of  By: [name] MD, Alyaan Budzynski    . COLITIS, HX OF 08/21/2007    Qualifier: Diagnosis of  By: Jerral Ralph    . BENIGN PROSTATIC HYPERTROPHY, HX OF 08/21/2007    Qualifier: Diagnosis of  By: Jerral Ralph    . Shortness of breath dyspnea     with exertion  . Nocturia    Past Surgical History  Procedure Laterality Date  . Hemorrhoid surgery  2001  . Cardiac catheterization N/A 07/25/2015    Procedure: Right/Left Heart Cath and Coronary Angiography;  Surgeon: Sherren Mocha, MD;  Location: [place] [facility];  Service: Cardiovascular;  Laterality: N/A;  . Colonoscopy    . Skin cancer excision      left leg  . Transcatheter aortic valve replacement, transfemoral N/A 08/08/2015    Procedure: TRANSCATHETER AORTIC VALVE REPLACEMENT, TRANSFEMORAL;  Surgeon: Sherren Mocha, MD;  Location: [place];  Service: Open Heart Surgery;  Laterality: N/A;  . Tee without cardioversion N/A 08/08/2015    Procedure: TRANSESOPHAGEAL ECHOCARDIOGRAM (TEE);  Surgeon: Sherren Mocha, MD;  Location: [place];  Service: Open Heart Surgery;  Laterality: N/A;    reports that he has never smoked. He does not have any smokeless tobacco history on file. He reports that he does not drink alcohol or use illicit drugs. family history includes Asthma in his mother; Heart  disease in his other. Allergies  Allergen Reactions  . Neosporin [Neomycin-Bacitracin Zn-Polymyx] Rash      Review of Systems  Constitutional: Negative for fatigue.  Eyes: Negative for visual disturbance.  Respiratory: Negative for cough, chest tightness and shortness of breath.   Cardiovascular: Negative for chest pain, palpitations and leg swelling.  Gastrointestinal: Negative for abdominal pain.  Neurological: Negative for dizziness, syncope, weakness, light-headedness and headaches.       Objective:   Physical Exam  Constitutional: He is oriented to  person, place, and time. He appears well-developed and well-nourished.  Neck: Neck supple. No thyromegaly present.  Cardiovascular: Normal rate and regular rhythm.  Exam reveals no friction rub.   Pulmonary/Chest: Effort normal and breath sounds normal. No respiratory distress. He has no wheezes. He has no rales.  Musculoskeletal: He exhibits no edema.  Neurological: He is alert and oriented to person, place, and time.          Assessment & Plan:   #1 long-standing history of GERD. Recent addition of Plavix. Discontinue omeprazole. Currently symptoms controlled on Zantac 150 mg twice a day. If he has breakthrough symptoms will start Protonix.   #2 anemia with mild macrocytosis. Repeat CBC. Check B12 level with history of chronic PPI use.   #3 recent noted left upper lobe nodule 9 x 13 mm on CT angiogram. Patient will need three-month follow-up CT to reassess  [name] MD [facility]

## 2015-08-24 DIAGNOSIS — L03032 Cellulitis of left toe: Secondary | ICD-10-CM | POA: Diagnosis not present

## 2015-09-07 ENCOUNTER — Ambulatory Visit (HOSPITAL_COMMUNITY): Payer: Medicare Other | Attending: Cardiovascular Disease

## 2015-09-07 ENCOUNTER — Other Ambulatory Visit: Payer: Self-pay

## 2015-09-07 ENCOUNTER — Encounter: Payer: Self-pay | Admitting: Cardiovascular Disease

## 2015-09-07 ENCOUNTER — Ambulatory Visit (INDEPENDENT_AMBULATORY_CARE_PROVIDER_SITE_OTHER): Payer: Medicare Other | Admitting: Cardiovascular Disease

## 2015-09-07 VITALS — BP 126/58 | HR 60 | Ht 62.0 in | Wt 139.0 lb

## 2015-09-07 DIAGNOSIS — I350 Nonrheumatic aortic (valve) stenosis: Secondary | ICD-10-CM | POA: Diagnosis not present

## 2015-09-07 DIAGNOSIS — Z952 Presence of prosthetic heart valve: Secondary | ICD-10-CM

## 2015-09-07 DIAGNOSIS — Z954 Presence of other heart-valve replacement: Secondary | ICD-10-CM

## 2015-09-07 DIAGNOSIS — E785 Hyperlipidemia, unspecified: Secondary | ICD-10-CM | POA: Insufficient documentation

## 2015-09-07 DIAGNOSIS — Z953 Presence of xenogenic heart valve: Secondary | ICD-10-CM | POA: Diagnosis not present

## 2015-09-07 DIAGNOSIS — I351 Nonrheumatic aortic (valve) insufficiency: Secondary | ICD-10-CM | POA: Insufficient documentation

## 2015-09-07 LAB — ECHOCARDIOGRAM COMPLETE
AV Peak grad: 19 mmHg
AV VEL mean LVOT/AV: 0.5
AV pk vel: 217 cm/s
AVG: 10 mmHg
Ao pk vel: 0.5 m/s
CHL CUP DOP CALC LVOT VTI: 27.9 cm
DOP CAL AO MEAN VELOCITY: 148 cm/s
EERAT: 10.31
EWDT: 327 ms
FS: 33 % (ref 28–44)
HEIGHTINCHES: 62 in
IV/PV OW: 0.93
LA diam end sys: 37 mm
LA vol: 58 mL
LADIAMINDEX: 2.18 cm/m2
LASIZE: 37 mm
LAVOLA4C: 52.3 mL
LAVOLIN: 34.2 mL/m2
LV E/e'average: 10.31
LV PW d: 9.48 mm — AB (ref 0.6–1.1)
LV e' LATERAL: 6.53 cm/s
LVEEMED: 10.31
LVOT peak VTI: 0.56 cm
LVOTPV: 109 cm/s
MV Dec: 327
MVPKAVEL: 108 m/s
MVPKEVEL: 67.3 m/s
RV LATERAL S' VELOCITY: 10.9 cm/s
RV TAPSE: 27.7 mm
RV sys press: 34 mmHg
Reg peak vel: 253 cm/s
TDI e' lateral: 6.53
TDI e' medial: 5.77
TR max vel: 253 cm/s
VTI: 50.2 cm
WEIGHTICAEL: 2224 [oz_av]

## 2015-09-07 NOTE — Patient Instructions (Signed)
Medication Instructions:  Your physician recommends that you continue on your current medications as directed. Please refer to the Current Medication list given to you today.  Labwork: No new orders.   Testing/Procedures: Your physician has requested that you have an echocardiogram in 1 YEAR. Echocardiography is a painless test that uses sound waves to create images of your heart. It provides your doctor with information about the size and shape of your heart and how well your heart's chambers and valves are working. This procedure takes approximately one hour. There are no restrictions for this procedure.  Follow-Up: Your physician recommends that you schedule a follow-up appointment in: 2 MONTHS with Dr Wynonia Lawman  Your physician wants you to follow-up in: 1 YEAR with Dr Burt Knack.  You will receive a reminder letter in the mail two months in advance. If you don't receive a letter, please call our office to schedule the follow-up appointment.   Any Other Special Instructions Will Be Listed Below (If Applicable).     If you need a refill on your cardiac medications before your next appointment, please call your pharmacy.

## 2015-09-07 NOTE — Progress Notes (Signed)
Cardiology Office Note Date:  09/07/2015   ID:  Kristen Cardinal, DOB 1928-06-30, MRN [id number]  PCP:  [name], MD  Cardiologist:  Sherren Mocha, MD    Chief Complaint  Patient presents with  . Aortic Stenosis    Severe     History of Present Illness: James Hardy is a 82 y.o. male who presents for 30 day follow-up after TAVR. The patient developed severe aortic stenosis and underwent TAVR via a percutaneous right transfemoral approach 08/08/2015. His early postoperative course was uncomplicated.  He feels very well. Shortness of breath, chest pain, and exercise intolerance have completely resolved. He has been out running a tiller, spreading mulch, and has been very active in his yard. He did have some nausea with heavy work but no other symptoms.    Past Medical History  Diagnosis Date  . HYPERLIPIDEMIA 04/11/2009    Qualifier: Diagnosis of  By: Joyce Gross    . CERUMEN IMPACTION 04/11/2009    Qualifier: Diagnosis of  By: Joyce Gross    . INTERNAL HEMORRHOIDS 08/28/2006    Qualifier: Diagnosis of  By: Jerral Ralph    . APHTHOUS ULCERS 12/19/2009    Qualifier: Diagnosis of  By: [name] MD, Bruce    . ANGULAR CHEILITIS 04/11/2009    Qualifier: Diagnosis of  By: Joyce Gross    . GERD 12/02/2008    Qualifier: Diagnosis of  By: [name] MD, Bruce    . DIVERTICULOSIS, COLON 08/28/2006    Qualifier: Diagnosis of  By: Jerral Ralph    . OSTEOPOROSIS 08/21/2007    Qualifier: Diagnosis of  By: Jerral Ralph    . SLEEP APNEA 08/21/2007    Qualifier: Diagnosis of  By: Jerral Ralph    . COLONIC POLYPS, HX OF 12/02/2008    Qualifier: Diagnosis of  By: [name] MD, Bruce    . COLITIS, HX OF 08/21/2007    Qualifier: Diagnosis of  By: Jerral Ralph    . BENIGN PROSTATIC HYPERTROPHY, HX OF 08/21/2007    Qualifier: Diagnosis of  By: Jerral Ralph    . Shortness of breath dyspnea     with exertion  . Nocturia     Past  Surgical History  Procedure Laterality Date  . Hemorrhoid surgery  2001  . Cardiac catheterization N/A 07/25/2015    Procedure: Right/Left Heart Cath and Coronary Angiography;  Surgeon: Sherren Mocha, MD;  Location: [place] [facility];  Service: Cardiovascular;  Laterality: N/A;  . Colonoscopy    . Skin cancer excision      left leg  . Transcatheter aortic valve replacement, transfemoral N/A 08/08/2015    Procedure: TRANSCATHETER AORTIC VALVE REPLACEMENT, TRANSFEMORAL;  Surgeon: Sherren Mocha, MD;  Location: [place];  Service: Open Heart Surgery;  Laterality: N/A;  . Tee without cardioversion N/A 08/08/2015    Procedure: TRANSESOPHAGEAL ECHOCARDIOGRAM (TEE);  Surgeon: Sherren Mocha, MD;  Location: [place];  Service: Open Heart Surgery;  Laterality: N/A;    Current Outpatient Prescriptions  Medication Sig Dispense Refill  . aspirin 81 MG tablet Take 81 mg by mouth daily.    . Calcium Citrate 250 MG TABS Take 500 mg by mouth 2 (two) times daily.    . Cholecalciferol (VITAMIN D3) 5000 units TABS Take 5,000 Units by mouth daily.    . clopidogrel (PLAVIX) 75 MG tablet Take 1 tablet (75 mg total) by mouth daily with breakfast. 30 tablet 3  . doxazosin (CARDURA) 8 MG tablet Take 8 mg by mouth at bedtime.    Marland Kitchen  finasteride (PROSCAR) 5 MG tablet Take 5 mg by mouth daily.    Marland Kitchen glucosamine-chondroitin 500-400 MG tablet Take 2 tablets by mouth 2 (two) times daily.    . Multiple Vitamin (MULTIVITAMIN) tablet Take 1 tablet by mouth daily.    . NON FORMULARY CPAP machine    . Omega-3 Fatty Acids (FISH OIL) 1000 MG CAPS Take 1,000 mg by mouth daily.    . pantoprazole (PROTONIX) 40 MG tablet Take 1 tablet (40 mg total) by mouth daily. 30 tablet 11  . Probiotic Product (PRO-BIOTIC BLEND) CAPS Take 1 capsule by mouth daily as needed (digestive issues).     . ranitidine (ZANTAC) 75 MG tablet Take 75 mg by mouth 2 (two) times daily.    . tamsulosin (FLOMAX) 0.4 MG CAPS capsule Take 0.4 mg by mouth daily.      No  current facility-administered medications for this visit.    Allergies:   Neosporin   Social History:  The patient  reports that he has never smoked. He does not have any smokeless tobacco history on file. He reports that he does not drink alcohol or use illicit drugs.   Family History:  The patient's  family history includes Asthma in his mother; Heart disease in his other.    ROS:  Please see the history of present illness. All other systems are reviewed and negative.    PHYSICAL EXAM: VS:  BP 126/58 mmHg  Pulse 60  Ht 5\' 2"  (1.575 m)  Wt 139 lb (63.05 kg)  BMI 25.42 kg/m2 , BMI Body mass index is 25.42 kg/(m^2). GEN: Well nourished, well developed, pleasant elderly male in no acute distress HEENT: normal Neck: no JVD, no masses.  Cardiac: RRR without murmur or gallop                Respiratory:  clear to auscultation bilaterally, normal work of breathing GI: soft, nontender, nondistended, + BS MS: no deformity or atrophy Ext: no pretibial edema Skin: warm and dry, no rash Neuro:  Strength and sensation are intact Psych: euthymic mood, full affect  EKG:  EKG is not ordered today.  Recent Labs: 08/04/2015: ALT 16* 08/09/2015: BUN 15; Creatinine, Ser 1.04; Magnesium 2.0; Potassium 3.8; Sodium 139 08/18/2015: Hemoglobin 10.1*; Platelets 205.0   Lipid Panel     Component Value Date/Time   CHOL 110 04/11/2009 0955   TRIG 26.0 04/11/2009 0955   HDL 31.70* 04/11/2009 0955   CHOLHDL 3 04/11/2009 0955   VLDL 5.2 04/11/2009 0955   LDLCALC 73 04/11/2009 0955      Wt Readings from Last 3 Encounters:  09/07/15 139 lb (63.05 kg)  08/18/15 142 lb (64.411 kg)  08/16/15 141 lb 9.6 oz (64.229 kg)     Cardiac Studies Reviewed: 2D Echo (today): Study Conclusions  - Left ventricle: Systolic function was normal. The estimated  ejection fraction was in the range of 55% to 60%. Wall motion was  normal; there were no regional wall motion abnormalities. Doppler  parameters are  consistent with abnormal left ventricular  relaxation (grade 1 diastolic dysfunction). - Aortic valve: A stent-valve bioprosthesis (TAVR) was present and  functioning normally. There was mild perivalvular regurgitation,  at the posterior aspect of the aortic annulus, towards the mitral  valve. - Pulmonary arteries: Systolic pressure was mildly increased. PA  peak pressure: 34 mm Hg (S).   ASSESSMENT AND PLAN: Aortic valve disease s/p TAVR: doing well with NYHA functional class I symptoms. He appears to be completely asymptomatic now  just one month after TAVR and is doing physical work in his yard. His echo is reviewed and the transcatheter heart valve is functionally normally with a mean gradient of 10 mmHg and mild paravalvular regurgitation. I cannot appreciate a diastolic murmur on his exam. He understands instructions for SBE prophylaxis. I advised him to FU with Dr Wynonia Lawman in 2 months and I plan to see him back for a one year [facility]ve [facility] appt.  Current medicines are reviewed with the patient today.  The patient does not have concerns regarding medicines.  Labs/ tests ordered today include:   Orders Placed This Encounter  Procedures  . ECHO COMPLETE    Disposition:   FU as above  Signed, Sherren Mocha, MD  09/07/2015 1:14 PM    [facility] [place], [place], [place]  [address] Phone: [phone]; Fax: [phone]

## 2015-10-09 ENCOUNTER — Other Ambulatory Visit: Payer: Self-pay | Admitting: Surgery

## 2015-10-09 DIAGNOSIS — R911 Solitary pulmonary nodule: Secondary | ICD-10-CM

## 2015-11-14 DIAGNOSIS — R509 Fever, unspecified: Secondary | ICD-10-CM | POA: Diagnosis not present

## 2015-11-14 DIAGNOSIS — J40 Bronchitis, not specified as acute or chronic: Secondary | ICD-10-CM | POA: Diagnosis not present

## 2015-11-15 ENCOUNTER — Encounter: Payer: Self-pay | Admitting: Surgery

## 2015-11-15 ENCOUNTER — Ambulatory Visit
Admission: RE | Admit: 2015-11-15 | Discharge: 2015-11-15 | Disposition: A | Discharge status: Home / Self Care | Payer: Medicare Other | Source: Ambulatory Visit | Attending: Surgery | Admitting: Surgery

## 2015-11-15 ENCOUNTER — Ambulatory Visit (INDEPENDENT_AMBULATORY_CARE_PROVIDER_SITE_OTHER): Payer: Medicare Other | Admitting: Surgery

## 2015-11-15 ENCOUNTER — Ambulatory Visit (INDEPENDENT_AMBULATORY_CARE_PROVIDER_SITE_OTHER): Payer: Medicare Other | Admitting: Family Medicine

## 2015-11-15 ENCOUNTER — Encounter: Payer: Self-pay | Admitting: Family Medicine

## 2015-11-15 ENCOUNTER — Other Ambulatory Visit: Payer: Self-pay | Admitting: Family Medicine

## 2015-11-15 ENCOUNTER — Other Ambulatory Visit: Payer: Medicare Other

## 2015-11-15 VITALS — BP 128/70 | HR 87 | Temp 101.9°F | Ht 62.0 in | Wt 141.4 lb

## 2015-11-15 VITALS — BP 143/54 | HR 81 | Temp 101.3°F | Resp 18 | Ht 62.0 in | Wt 140.0 lb

## 2015-11-15 DIAGNOSIS — Z954 Presence of other heart-valve replacement: Secondary | ICD-10-CM

## 2015-11-15 DIAGNOSIS — M609 Myositis, unspecified: Secondary | ICD-10-CM

## 2015-11-15 DIAGNOSIS — R531 Weakness: Secondary | ICD-10-CM | POA: Diagnosis not present

## 2015-11-15 DIAGNOSIS — I350 Nonrheumatic aortic (valve) stenosis: Secondary | ICD-10-CM

## 2015-11-15 DIAGNOSIS — R509 Fever, unspecified: Secondary | ICD-10-CM | POA: Diagnosis not present

## 2015-11-15 DIAGNOSIS — Z953 Presence of xenogenic heart valve: Secondary | ICD-10-CM

## 2015-11-15 DIAGNOSIS — IMO0001 Reserved for inherently not codable concepts without codable children: Secondary | ICD-10-CM

## 2015-11-15 DIAGNOSIS — R911 Solitary pulmonary nodule: Secondary | ICD-10-CM

## 2015-11-15 DIAGNOSIS — M791 Myalgia: Secondary | ICD-10-CM | POA: Diagnosis not present

## 2015-11-15 DIAGNOSIS — R918 Other nonspecific abnormal finding of lung field: Secondary | ICD-10-CM | POA: Diagnosis not present

## 2015-11-15 LAB — POCT URINALYSIS DIPSTICK
Bilirubin, UA: NEGATIVE
Glucose, UA: NEGATIVE
Ketones, UA: NEGATIVE
LEUKOCYTES UA: NEGATIVE
NITRITE UA: NEGATIVE
PH UA: 5.5
RBC UA: NEGATIVE
Spec Grav, UA: >=1.03
UROBILINOGEN UA: 0.2

## 2015-11-15 MED ORDER — PANTOPRAZOLE SODIUM 40 MG PO TBEC: 40.0000 mg | DELAYED_RELEASE_TABLET | Freq: Every day | ORAL | 3 refills | Status: DC

## 2015-11-15 MED ORDER — DOXYCYCLINE HYCLATE 100 MG PO CAPS: 100.0000 mg | ORAL_CAPSULE | Freq: Two times a day (BID) | ORAL | 0 refills | Status: DC

## 2015-11-15 MED ORDER — CLOPIDOGREL BISULFATE 75 MG PO TABS: 75.0000 mg | ORAL_TABLET | Freq: Every day | ORAL | 1 refills | Status: DC

## 2015-11-15 NOTE — Progress Notes (Signed)
Subjective:     [name], male   DOB: Apr 08, 1928, 82 y.o.   MRN: [id number]  HPI Patient seen with fever. Had fever yesterday 102 went to local urgent care. He was prescribed amoxicillin but no testing was done. He developed couple days ago some mild cough with diffuse body aches and fever. He had some chills. Denies any burning with urine showed does have some mild frequency. He's had some mild headache past couple days. No stiff neck.  Denies nasal congestion. No skin rash. No abdominal pain. Decreased appetite. No vomiting.  He does lots of outdoor work and states he has pulled off several ticks this summer though he does not recall removing any ticks over the past month. He has mostly removed deer tics.  Patient had recent aortic valve surgery and has done extremely well until this recent fever. His overall dyspnea has improved. He had follow-up CT chest earlier today to follow-up pulmonary nodule. His lung nodule had decreased from 13 mm to 9 mm. CT did not show any evidence for pneumonia. Is currently taking amoxicillin 500 mgs 3 times a day  Wife had somewhat similar symptoms (though less severe) last weekend.  Past Medical History:  Diagnosis Date  . ANGULAR CHEILITIS 04/11/2009   Qualifier: Diagnosis of  By: Joyce Gross    . APHTHOUS ULCERS 12/19/2009   Qualifier: Diagnosis of  By: [name] MD, Bruce    . BENIGN PROSTATIC HYPERTROPHY, HX OF 08/21/2007   Qualifier: Diagnosis of  By: Jerral Ralph    . CERUMEN IMPACTION 04/11/2009   Qualifier: Diagnosis of  By: Joyce Gross    . COLITIS, HX OF 08/21/2007   Qualifier: Diagnosis of  By: Jerral Ralph    . COLONIC POLYPS, HX OF 12/02/2008   Qualifier: Diagnosis of  By: [name] MD, Bruce    . DIVERTICULOSIS, COLON 08/28/2006   Qualifier: Diagnosis of  By: Jerral Ralph    . GERD 12/02/2008   Qualifier: Diagnosis of  By: [name] MD, Bruce    . HYPERLIPIDEMIA 04/11/2009   Qualifier: Diagnosis  of  By: Joyce Gross    . INTERNAL HEMORRHOIDS 08/28/2006   Qualifier: Diagnosis of  By: Jerral Ralph    . Nocturia   . OSTEOPOROSIS 08/21/2007   Qualifier: Diagnosis of  By: Jerral Ralph    . Shortness of breath dyspnea    with exertion  . SLEEP APNEA 08/21/2007   Qualifier: Diagnosis of  By: Jerral Ralph     Past Surgical History:  Procedure Laterality Date  . CARDIAC CATHETERIZATION N/A 07/25/2015   Procedure: Right/Left Heart Cath and Coronary Angiography;  Surgeon: Sherren Mocha, MD;  Location: [place] [facility];  Service: Cardiovascular;  Laterality: N/A;  . COLONOSCOPY    . [place]  2001  . SKIN CANCER EXCISION     left leg  . TEE WITHOUT CARDIOVERSION N/A 08/08/2015   Procedure: TRANSESOPHAGEAL ECHOCARDIOGRAM (TEE);  Surgeon: Sherren Mocha, MD;  Location: [place];  Service: Open Heart Surgery;  Laterality: N/A;  . TRANSCATHETER AORTIC VALVE REPLACEMENT, TRANSFEMORAL N/A 08/08/2015   Procedure: TRANSCATHETER AORTIC VALVE REPLACEMENT, TRANSFEMORAL;  Surgeon: Sherren Mocha, MD;  Location: [place];  Service: Open Heart Surgery;  Laterality: N/A;    reports that he has never smoked. He has never used smokeless tobacco. He reports that he does not drink alcohol or use drugs. family history includes Asthma in his mother; Heart disease in his other. Allergies  Allergen Reactions  . Neosporin [Neomycin-Bacitracin  Zn-Polymyx] Rash     Review of Systems  Constitutional: Positive for chills and fever. Negative for appetite change.  Respiratory: Positive for cough.   Cardiovascular: Negative for chest pain.  Gastrointestinal: Negative for abdominal pain.  Skin: Negative for rash.  Neurological: Positive for headaches. Negative for dizziness.  Hematological: Negative for adenopathy.       Objective:   Physical Exam  Constitutional: He appears well-developed and well-nourished.  HENT:  Right Ear: External ear normal.  Left Ear: External ear  normal.  Mouth/Throat: Oropharynx is clear and moist.  Neck: Neck supple.  Cardiovascular: Normal rate and regular rhythm.   Pulmonary/Chest: Effort normal and breath sounds normal. No respiratory distress. He has no wheezes. He has no rales.  Abdominal: Soft. Bowel sounds are normal. He exhibits no distension and no mass. There is no tenderness. There is no rebound and no guarding.  Lymphadenopathy:    He has no cervical adenopathy.  Skin: No rash noted.       Assessment:     Fever. Etiology unclear at this point. Vital signs are stable other than temperature 101.9. Nontoxic in appearance.  CT scan earlier today showed no pneumonia. Urinalysis here in office is completely normal. Doubt polymyalgia rheumatica as his body aches are fairly diffuse. He does have history of multiple tick bites recently but does not have any specific findings such as skin rash-but still need to keep possible tick bite related fever in DDx    Plan:     -Check further labs with CBC, sedimentation rate. Urinalysis as above -Check [facility] spotted fever and Lyme antibody titers -Consider coverage with doxycycline 100 mg twice a day pending further evaluation -stay well hydrated and follow up promptly for any confusion, vomiting, progressive weakness, or other concerns.    [name] MD [facility] at [facility]

## 2015-11-15 NOTE — Progress Notes (Signed)
Pre visit review using our clinic review tool, if applicable. No additional management support is needed unless otherwise documented below in the visit note. 

## 2015-11-15 NOTE — Progress Notes (Signed)
HPI:  The patient returns today to follow up on two left lung nodules found incidentally on his chest CT during his TAVR work-up. He underwent TAVR on 08/08/2015 and has done well with much improvement in his breathing and stamina. He does report developing URI symptoms since last week and then muscle and joint aches over the past few days. He also reports a fever and some chills and had a temp of 101.3 in the office today. He was seen in Urgent Care yesterday and started on Amoxicillin. He has an appt later today to see Dr. [name]. He reports being bitten by a tick a few weeks ago but did not have any local reaction to it.  Current Outpatient Prescriptions  Medication Sig Dispense Refill  . aspirin 81 MG tablet Take 81 mg by mouth daily.    . Calcium Citrate 250 MG TABS Take 500 mg by mouth 2 (two) times daily.    . Cholecalciferol (VITAMIN D3) 5000 units TABS Take 5,000 Units by mouth daily.    . clopidogrel (PLAVIX) 75 MG tablet Take 1 tablet (75 mg total) by mouth daily with breakfast. 30 tablet 3  . doxazosin (CARDURA) 8 MG tablet Take 8 mg by mouth at bedtime.    . finasteride (PROSCAR) 5 MG tablet Take 5 mg by mouth daily.    Marland Kitchen glucosamine-chondroitin 500-400 MG tablet Take 2 tablets by mouth 2 (two) times daily.    . Multiple Vitamin (MULTIVITAMIN) tablet Take 1 tablet by mouth daily.    . NON FORMULARY CPAP machine    . Omega-3 Fatty Acids (FISH OIL) 1000 MG CAPS Take 1,000 mg by mouth daily.    . pantoprazole (PROTONIX) 40 MG tablet Take 1 tablet (40 mg total) by mouth daily. 30 tablet 11  . Probiotic Product (PRO-BIOTIC BLEND) CAPS Take 1 capsule by mouth daily as needed (digestive issues).     . ranitidine (ZANTAC) 75 MG tablet Take 75 mg by mouth 2 (two) times daily.    . tamsulosin (FLOMAX) 0.4 MG CAPS capsule Take 0.4 mg by mouth daily.      No current facility-administered medications for this visit.      Physical Exam: BP (!) 143/54   Pulse 81   Temp (!) 101.3  F (38.5 C) (Oral)   Resp 18   Ht 5\' 2"  (1.575 m)   Wt 140 lb (63.5 kg)   SpO2 95% Comment: ON RA  BMI 25.61 kg/m  He looks tired but alert and conversant Lungs are clear Cardiac exam shows a regular rate and rhythm with a 2/6 systolic murmur along the RSB. Abdomen is soft and nontender There is no peripheral edema  Diagnostic Tests:  CLINICAL DATA:  Followup left lung lesion found on prior CT. Current fever.  EXAM: CT CHEST WITHOUT CONTRAST  TECHNIQUE: Multidetector CT imaging of the chest was performed following the standard protocol without IV contrast.  COMPARISON:  07/31/2015  FINDINGS: Cardiovascular: Prior aortic valve repair. Scattered coronary artery calcifications, most pronounced in the left anterior descending coronary artery. Moderate aortic calcifications. No aneurysm.  Mediastinum/Nodes: No mediastinal, hilar, or axillary adenopathy.  Lungs/Pleura: Biapical scarring. Multiple nodular areas within the lung. The left upper lobe nodule which previously measured 13 x 9 mm currently measures 9 x 6 mm on image 54 an appears more solid. 7 mm nodule in the lingula is stable. Other areas of scarring in the lingula, right middle lobe and lung bases. No new or enlarging  pulmonary nodules.  Upper Abdomen: Imaging into the upper abdomen shows no acute findings.  Musculoskeletal: Chest wall soft tissues are unremarkable. No acute bony abnormality or focal bone lesion ribs.  IMPRESSION: Scattered nodular areas within the lungs, smaller or stable since prior study. In particular, the most concerning nodule in the left upper lobe has decreased in size and the common for solid in appearance, measuring up to 9 mm on today's study compared with 13 mm previously. This could be followed with repeat CT in 6-12 months to ensure stability or continued improvement.  Prior aortic valve repair.  Cardiomegaly.  Coronary artery disease.  Aortic  atherosclerosis.   Electronically Signed   By: Rolm Baptise M.D.   On: 11/15/2015 10:26   Impression:  The left upper lobe nodule is smaller than it was on the initial CT decreasing from 13 mm to 9 mm. The lingular nodule is stable at 7 mm. I suspect that these are benign but should be followed a while longer to be sure. He has a current fever, chills and myalgia, joint pains with recent URI symptoms. This may be a viral illness. There is no sign of pneumonia on the CT chest today. He is going to see his PCP later today for evaluation.  Plan:  I will see him back in 6 months with a CT scan of the chest without contrast to reevaluate the lung nodules.   Gaye Pollack, MD [facility] [phone]

## 2015-11-15 NOTE — Patient Instructions (Signed)
Stay well hydrated Go to ER for any increased weakness, vomiting, confusion

## 2015-11-16 ENCOUNTER — Other Ambulatory Visit: Payer: Medicare Other

## 2015-11-16 DIAGNOSIS — D729 Disorder of white blood cells, unspecified: Secondary | ICD-10-CM

## 2015-11-16 LAB — CBC WITH DIFFERENTIAL/PLATELET
BASOS PCT: 0.8 % (ref 0.0–3.0)
Basophils Absolute: 0.1 10*3/uL (ref 0.0–0.1)
EOS PCT: 0.3 % (ref 0.0–5.0)
Eosinophils Absolute: 0 10*3/uL (ref 0.0–0.7)
HCT: 28.7 % — ABNORMAL LOW (ref 39.0–52.0)
HEMOGLOBIN: 9.7 g/dL — AB (ref 13.0–17.0)
LYMPHS PCT: 13.2 % (ref 12.0–46.0)
Lymphs Abs: 1 10*3/uL (ref 0.7–4.0)
MCHC: 34 g/dL (ref 30.0–36.0)
MCV: 99.1 fl (ref 78.0–100.0)
Monocytes Absolute: 2.5 10*3/uL — ABNORMAL HIGH (ref 0.1–1.0)
Neutro Abs: 3.8 10*3/uL (ref 1.4–7.7)
Neutrophils Relative %: 51.7 % (ref 43.0–77.0)
Platelets: 21 10*3/uL — CL (ref 150.0–400.0)
RBC: 2.9 Mil/uL — AB (ref 4.22–5.81)
RDW: 16.7 % — AB (ref 11.5–15.5)
WBC: 7.3 10*3/uL (ref 4.0–10.5)

## 2015-11-16 LAB — COMPREHENSIVE METABOLIC PANEL
ALBUMIN: 3.5 g/dL (ref 3.5–5.2)
ALK PHOS: 70 U/L (ref 39–117)
ALT: 34 U/L (ref 0–53)
AST: 47 U/L — ABNORMAL HIGH (ref 0–37)
BUN: 30 mg/dL — AB (ref 6–23)
CALCIUM: 7.9 mg/dL — AB (ref 8.4–10.5)
CHLORIDE: 104 meq/L (ref 96–112)
CO2: 23 mEq/L (ref 19–32)
CREATININE: 1.69 mg/dL — AB (ref 0.40–1.50)
GFR: 41.18 mL/min — ABNORMAL LOW (ref >60.00)
Glucose, Bld: 133 mg/dL — ABNORMAL HIGH (ref 70–99)
POTASSIUM: 4.7 meq/L (ref 3.5–5.1)
Sodium: 134 mEq/L — ABNORMAL LOW (ref 135–145)
TOTAL PROTEIN: 5.9 g/dL — AB (ref 6.0–8.3)
Total Bilirubin: 0.8 mg/dL (ref 0.2–1.2)

## 2015-11-16 LAB — LYME AB/WESTERN BLOT REFLEX

## 2015-11-17 ENCOUNTER — Encounter: Payer: Self-pay | Admitting: Family Medicine

## 2015-11-17 ENCOUNTER — Other Ambulatory Visit: Payer: Medicare Other

## 2015-11-17 ENCOUNTER — Ambulatory Visit (INDEPENDENT_AMBULATORY_CARE_PROVIDER_SITE_OTHER): Payer: Medicare Other | Admitting: Family Medicine

## 2015-11-17 VITALS — BP 120/62 | HR 84 | Temp 97.9°F | Ht 62.0 in | Wt 140.0 lb

## 2015-11-17 DIAGNOSIS — D696 Thrombocytopenia, unspecified: Secondary | ICD-10-CM

## 2015-11-17 DIAGNOSIS — R7401 Elevation of levels of liver transaminase levels: Secondary | ICD-10-CM

## 2015-11-17 DIAGNOSIS — N179 Acute kidney failure, unspecified: Secondary | ICD-10-CM

## 2015-11-17 DIAGNOSIS — R740 Nonspecific elevation of levels of transaminase and lactic acid dehydrogenase [LDH]: Secondary | ICD-10-CM | POA: Diagnosis not present

## 2015-11-17 LAB — CBC WITH DIFFERENTIAL/PLATELET
BASOS ABS: 0 10*3/uL (ref 0.0–0.1)
BASOS ABS: 0 10*3/uL (ref 0.0–0.1)
BASOS PCT: 0.3 % (ref 0.0–3.0)
Basophils Relative: 0.3 % (ref 0.0–3.0)
EOS ABS: 0 10*3/uL (ref 0.0–0.7)
Eosinophils Absolute: 0 10*3/uL (ref 0.0–0.7)
Eosinophils Relative: 0 % (ref 0.0–5.0)
Eosinophils Relative: 0 % (ref 0.0–5.0)
HEMATOCRIT: 26.6 % — AB (ref 39.0–52.0)
HEMATOCRIT: 26.6 % — AB (ref 39.0–52.0)
HEMOGLOBIN: 9.2 g/dL — AB (ref 13.0–17.0)
Hemoglobin: 9.2 g/dL — ABNORMAL LOW (ref 13.0–17.0)
LYMPHS PCT: 32.4 % (ref 12.0–46.0)
Lymphocytes Relative: 32.4 % (ref 12.0–46.0)
Lymphs Abs: 1.3 10*3/uL (ref 0.7–4.0)
Lymphs Abs: 1.3 10*3/uL (ref 0.7–4.0)
MCHC: 34.5 g/dL (ref 30.0–36.0)
MCHC: 34.5 g/dL (ref 30.0–36.0)
MCV: 98.2 fl (ref 78.0–100.0)
MCV: 98.2 fl (ref 78.0–100.0)
Monocytes Absolute: 0.1 10*3/uL (ref 0.1–1.0)
Monocytes Absolute: 0.1 10*3/uL (ref 0.1–1.0)
Monocytes Relative: 2 % — ABNORMAL LOW (ref 3.0–12.0)
Monocytes Relative: 2 % — ABNORMAL LOW (ref 3.0–12.0)
NEUTROS ABS: 2.7 10*3/uL (ref 1.4–7.7)
Neutro Abs: 2.7 10*3/uL (ref 1.4–7.7)
Neutrophils Relative %: 65.3 % (ref 43.0–77.0)
Neutrophils Relative %: 65.3 % (ref 43.0–77.0)
RBC: 2.71 Mil/uL — AB (ref 4.22–5.81)
RBC: 2.71 Mil/uL — ABNORMAL LOW (ref 4.22–5.81)
RDW: 16.5 % — ABNORMAL HIGH (ref 11.5–15.5)
RDW: 16.5 % — ABNORMAL HIGH (ref 11.5–15.5)
WBC: 4.1 10*3/uL (ref 4.0–10.5)
WBC: 4.1 10*3/uL (ref 4.0–10.5)

## 2015-11-17 LAB — COMPREHENSIVE METABOLIC PANEL
ALK PHOS: 72 U/L (ref 39–117)
ALT: 27 U/L (ref 0–53)
AST: 32 U/L (ref 0–37)
Albumin: 3.1 g/dL — ABNORMAL LOW (ref 3.5–5.2)
BILIRUBIN TOTAL: 0.6 mg/dL (ref 0.2–1.2)
BUN: 31 mg/dL — ABNORMAL HIGH (ref 6–23)
CALCIUM: 7.9 mg/dL — AB (ref 8.4–10.5)
CO2: 23 mEq/L (ref 19–32)
Chloride: 108 mEq/L (ref 96–112)
Creatinine, Ser: 1.39 mg/dL (ref 0.40–1.50)
GFR: 51.6 mL/min — AB (ref >60.00)
Glucose, Bld: 107 mg/dL — ABNORMAL HIGH (ref 70–99)
Potassium: 4.3 mEq/L (ref 3.5–5.1)
Sodium: 139 mEq/L (ref 135–145)
TOTAL PROTEIN: 5.3 g/dL — AB (ref 6.0–8.3)

## 2015-11-17 LAB — PATHOLOGIST SMEAR REVIEW

## 2015-11-17 LAB — LACTATE DEHYDROGENASE: LDH: 256 U/L — AB (ref 120–250)

## 2015-11-17 NOTE — Progress Notes (Unsigned)
pl

## 2015-11-17 NOTE — Patient Instructions (Signed)
Follow up for any recurrent fever, bleeding, petechiae, or other concerns

## 2015-11-17 NOTE — Progress Notes (Signed)
Subjective:     [name], male   DOB: [birth year], 82 y.o.   MRN: [id number]  HPI Patient seen for follow-up regarding acute illness. Refer to recent note from couple days ago. Patient developed fever up to 102. He had relatively nonfocal exam. He had nonspecific symptoms of headache and body aches. No rash. He did have history of multiple tick bites this summer though he did not recall any within the past month. He had CT of the chest earlier that day which did not show any pneumonia and urinalysis was unremarkable. We obtain further screening labs significant for platelet count 21,000 with increased creatinine 1.69 and mild liver transaminase elevations. We started doxycycline for cover for possible tick fever and patient noted fairly dramatic improvement. He has no fever. No headache. No rash. Appetite is improving. Increased energy.  He has not noted any petechiae or any other bleeding complications.  Past Medical History:  Diagnosis Date  . ANGULAR CHEILITIS 04/11/2009   Qualifier: Diagnosis of  By: Joyce Gross    . APHTHOUS ULCERS 12/19/2009   Qualifier: Diagnosis of  By: [name] MD, Shi Blankenship    . BENIGN PROSTATIC HYPERTROPHY, HX OF 08/21/2007   Qualifier: Diagnosis of  By: Jerral Ralph    . CERUMEN IMPACTION 04/11/2009   Qualifier: Diagnosis of  By: Joyce Gross    . COLITIS, HX OF 08/21/2007   Qualifier: Diagnosis of  By: Jerral Ralph    . COLONIC POLYPS, HX OF 12/02/2008   Qualifier: Diagnosis of  By: [name] MD, Robet Crutchfield    . DIVERTICULOSIS, COLON 08/28/2006   Qualifier: Diagnosis of  By: Jerral Ralph    . GERD 12/02/2008   Qualifier: Diagnosis of  By: [name] MD, Nathanel Tallman    . HYPERLIPIDEMIA 04/11/2009   Qualifier: Diagnosis of  By: Joyce Gross    . INTERNAL HEMORRHOIDS 08/28/2006   Qualifier: Diagnosis of  By: Jerral Ralph    . Nocturia   . OSTEOPOROSIS 08/21/2007   Qualifier: Diagnosis of  By: Jerral Ralph    .  Shortness of breath dyspnea    with exertion  . SLEEP APNEA 08/21/2007   Qualifier: Diagnosis of  By: Jerral Ralph     Past Surgical History:  Procedure Laterality Date  . CARDIAC CATHETERIZATION N/A 07/25/2015   Procedure: Right/Left Heart Cath and Coronary Angiography;  Surgeon: Sherren Mocha, MD;  Location: [place] [facility];  Service: Cardiovascular;  Laterality: N/A;  . COLONOSCOPY    . [place]  2001  . SKIN CANCER EXCISION     left leg  . TEE WITHOUT CARDIOVERSION N/A 08/08/2015   Procedure: TRANSESOPHAGEAL ECHOCARDIOGRAM (TEE);  Surgeon: Sherren Mocha, MD;  Location: [place];  Service: Open Heart Surgery;  Laterality: N/A;  . TRANSCATHETER AORTIC VALVE REPLACEMENT, TRANSFEMORAL N/A 08/08/2015   Procedure: TRANSCATHETER AORTIC VALVE REPLACEMENT, TRANSFEMORAL;  Surgeon: Sherren Mocha, MD;  Location: [place];  Service: Open Heart Surgery;  Laterality: N/A;    reports that he has never smoked. He has never used smokeless tobacco. He reports that he does not drink alcohol or use drugs. family history includes Asthma in his mother; Heart disease in his other. Allergies  Allergen Reactions  . Neosporin [Neomycin-Bacitracin Zn-Polymyx] Rash     Review of Systems  Constitutional: Negative for appetite change, chills and fever.  Respiratory: Negative for cough and shortness of breath.   Cardiovascular: Negative for chest pain.  Gastrointestinal: Negative for abdominal pain, nausea and vomiting.  Genitourinary: Negative for dysuria.  Neurological: Negative for headaches.  Hematological: Negative for adenopathy. Does not bruise/bleed easily.  Psychiatric/Behavioral: Negative for confusion.       Objective:   Physical Exam  Constitutional: He is oriented to person, place, and time. He appears well-developed and well-nourished.  HENT:  Mouth/Throat: Oropharynx is clear and moist.  Neck: Neck supple.  Cardiovascular: Normal rate and regular rhythm.    Pulmonary/Chest: Effort normal and breath sounds normal. No respiratory distress. He has no wheezes. He has no rales.  Abdominal: Soft. He exhibits no mass. There is no tenderness.  No splenomegaly or hepatomegaly  Musculoskeletal: He exhibits no edema.  Lymphadenopathy:    He has no cervical adenopathy.  Neurological: He is alert and oriented to person, place, and time.  Skin: No rash noted.       Assessment:     #1 recent febrile illness. Differential should include [facility] spotted fever. Titers pending and we explained to patient they may be negative even if this were [facility] spotted fever as this sometimes take several days to weeks to increase He is definitely improving clinically on doxycycline  #2 thrombocytopenia. Etiology uncertain at this point but can be seen with tick related illness  #3 acute kidney injury. Possibly partly dehydration  #4 mild liver transaminase elevations    Plan:     -Repeat stat CBC and comprehensive metabolic panel today. If platelet count dropping further may need to consider hospitalization. Hopefully, this will be increasing -Continue doxycycline -Follow-up immediately for any petechiae or other bleeding complications or for any recurrent fever  [name] MD [facility] at [facility]

## 2015-11-20 ENCOUNTER — Telehealth: Payer: Self-pay | Admitting: Family Medicine

## 2015-11-20 NOTE — Telephone Encounter (Signed)
Received pa request for pantoprazole from pharmacy.  Submitted on CoverMyMeds.  Waiting on a reply

## 2015-11-23 NOTE — Telephone Encounter (Signed)
PA for Protonix has been approved. Pharmacy notified and will contact patient. Thanks!

## 2015-11-24 ENCOUNTER — Encounter: Payer: Self-pay | Admitting: Family Medicine

## 2015-11-24 ENCOUNTER — Ambulatory Visit (INDEPENDENT_AMBULATORY_CARE_PROVIDER_SITE_OTHER): Payer: Medicare Other | Admitting: Family Medicine

## 2015-11-24 VITALS — BP 110/60 | HR 81 | Temp 97.8°F | Ht 62.0 in | Wt 137.0 lb

## 2015-11-24 DIAGNOSIS — D696 Thrombocytopenia, unspecified: Secondary | ICD-10-CM | POA: Diagnosis not present

## 2015-11-24 LAB — CBC WITH DIFFERENTIAL/PLATELET
Basophils Absolute: 0 10*3/uL (ref 0.0–0.1)
Basophils Relative: 0.3 % (ref 0.0–3.0)
EOS PCT: 0.3 % (ref 0.0–5.0)
Eosinophils Absolute: 0 10*3/uL (ref 0.0–0.7)
HCT: 25.5 % — ABNORMAL LOW (ref 39.0–52.0)
Hemoglobin: 8.8 g/dL — ABNORMAL LOW (ref 13.0–17.0)
LYMPHS ABS: 1.8 10*3/uL (ref 0.7–4.0)
Lymphocytes Relative: 12.9 % (ref 12.0–46.0)
MCHC: 34.4 g/dL (ref 30.0–36.0)
MCV: 95.9 fl (ref 78.0–100.0)
MONO ABS: 1.7 10*3/uL — AB (ref 0.1–1.0)
MONOS PCT: 12.6 % — AB (ref 3.0–12.0)
NEUTROS ABS: 10.1 10*3/uL — AB (ref 1.4–7.7)
NEUTROS PCT: 73.9 % (ref 43.0–77.0)
PLATELETS: 322 10*3/uL (ref 150.0–400.0)
RBC: 2.66 Mil/uL — ABNORMAL LOW (ref 4.22–5.81)
RDW: 15.7 % — ABNORMAL HIGH (ref 11.5–15.5)
WBC: 13.6 10*3/uL — ABNORMAL HIGH (ref 4.0–10.5)

## 2015-11-24 MED ORDER — VALACYCLOVIR HCL 1 G PO TABS: ORAL_TABLET | ORAL | 0 refills | Status: DC

## 2015-11-24 NOTE — Progress Notes (Signed)
Subjective:     [name], male   DOB: 06/25/1928, 82 y.o.   MRN: [id number]  HPI  Patient seen for follow-up regarding recent febrile illness. Suspected [place] spotted fever. His antibody titers are still pending. He has history of multiple recent tick bites. He presented with high fever along with nonspecific symptoms of malaise and body aches. He had slightly elevated lactate dehydrogenase levels along with low platelets and mildly elevated liver transaminases. After placing him on doxycycline he had prompt resolution of symptoms. His platelet counts were 21,000. He has not had any petechiae or other skin rashes. No headache. No persistent fever. Feels back to baseline  History of frequent cold sores. He has used topicals over-the-counter without much improvement. Most recent flareup couple of weeks ago. Inquiring about possible treatments.  Past Medical History:  Diagnosis Date  . ANGULAR CHEILITIS 04/11/2009   Qualifier: Diagnosis of  By: Joyce Gross    . APHTHOUS ULCERS 12/19/2009   Qualifier: Diagnosis of  By: [name] MD, Seymour Pavlak    . BENIGN PROSTATIC HYPERTROPHY, HX OF 08/21/2007   Qualifier: Diagnosis of  By: Jerral Ralph    . CERUMEN IMPACTION 04/11/2009   Qualifier: Diagnosis of  By: Joyce Gross    . COLITIS, HX OF 08/21/2007   Qualifier: Diagnosis of  By: Jerral Ralph    . COLONIC POLYPS, HX OF 12/02/2008   Qualifier: Diagnosis of  By: [name] MD, Paizleigh Wilds    . DIVERTICULOSIS, COLON 08/28/2006   Qualifier: Diagnosis of  By: Jerral Ralph    . GERD 12/02/2008   Qualifier: Diagnosis of  By: [name] MD, Shanel Prazak    . HYPERLIPIDEMIA 04/11/2009   Qualifier: Diagnosis of  By: Joyce Gross    . INTERNAL HEMORRHOIDS 08/28/2006   Qualifier: Diagnosis of  By: Jerral Ralph    . Nocturia   . OSTEOPOROSIS 08/21/2007   Qualifier: Diagnosis of  By: Jerral Ralph    . Shortness of breath dyspnea    with exertion  . SLEEP APNEA  08/21/2007   Qualifier: Diagnosis of  By: Jerral Ralph     Past Surgical History:  Procedure Laterality Date  . CARDIAC CATHETERIZATION N/A 07/25/2015   Procedure: Right/Left Heart Cath and Coronary Angiography;  Surgeon: Sherren Mocha, MD;  Location: [place] [facility];  Service: Cardiovascular;  Laterality: N/A;  . COLONOSCOPY    . [place]  2001  . SKIN CANCER EXCISION     left leg  . TEE WITHOUT CARDIOVERSION N/A 08/08/2015   Procedure: TRANSESOPHAGEAL ECHOCARDIOGRAM (TEE);  Surgeon: Sherren Mocha, MD;  Location: [place];  Service: Open Heart Surgery;  Laterality: N/A;  . TRANSCATHETER AORTIC VALVE REPLACEMENT, TRANSFEMORAL N/A 08/08/2015   Procedure: TRANSCATHETER AORTIC VALVE REPLACEMENT, TRANSFEMORAL;  Surgeon: Sherren Mocha, MD;  Location: [place];  Service: Open Heart Surgery;  Laterality: N/A;    reports that he has never smoked. He has never used smokeless tobacco. He reports that he does not drink alcohol or use drugs. family history includes Asthma in his mother; Heart disease in his other. Allergies  Allergen Reactions  . Neosporin [Neomycin-Bacitracin Zn-Polymyx] Rash     .  Review of Systems  Constitutional: Negative for chills and fever.  Skin: Negative for rash.  Neurological: Negative for headaches.       Objective:   Physical Exam  Constitutional: He appears well-developed and well-nourished.  HENT:  Healing cold sore mid aspect of his lower lip  Neck: Neck supple.  Cardiovascular: Normal  rate and regular rhythm.   Pulmonary/Chest: Effort normal and breath sounds normal. No respiratory distress. He has no wheezes. He has no rales.  Lymphadenopathy:    He has no cervical adenopathy.  Skin: No rash noted.       Assessment:     Recent febrile illness. Suspect [facility] spotted fever. Patient clinically improved. Thrombocytopenia probably related to his acute illness  Recurrent cold sores    Plan:     -Finish out  doxycycline -We'll follow-up on [facility] spotted fever titers which are pending -Repeat CBC today -Follow-up immediately for any recurrent fever or other concern -Valacyclovir 1 g take 2 tablets at onset of cold sore and repeat 2 tablets in 12 hours  [name] MD [facility] at [facility]

## 2015-12-04 ENCOUNTER — Ambulatory Visit (INDEPENDENT_AMBULATORY_CARE_PROVIDER_SITE_OTHER): Payer: Medicare Other | Admitting: Otolaryngology

## 2015-12-04 DIAGNOSIS — H6123 Impacted cerumen, bilateral: Secondary | ICD-10-CM

## 2015-12-04 DIAGNOSIS — J310 Chronic rhinitis: Secondary | ICD-10-CM

## 2015-12-12 DIAGNOSIS — H02839 Dermatochalasis of unspecified eye, unspecified eyelid: Secondary | ICD-10-CM | POA: Diagnosis not present

## 2015-12-12 DIAGNOSIS — H18412 Arcus senilis, left eye: Secondary | ICD-10-CM | POA: Diagnosis not present

## 2015-12-12 DIAGNOSIS — H2511 Age-related nuclear cataract, right eye: Secondary | ICD-10-CM | POA: Diagnosis not present

## 2015-12-12 DIAGNOSIS — H18411 Arcus senilis, right eye: Secondary | ICD-10-CM | POA: Diagnosis not present

## 2015-12-28 ENCOUNTER — Other Ambulatory Visit: Payer: Medicare Other

## 2015-12-29 ENCOUNTER — Other Ambulatory Visit (INDEPENDENT_AMBULATORY_CARE_PROVIDER_SITE_OTHER): Payer: Medicare Other

## 2015-12-29 DIAGNOSIS — D509 Iron deficiency anemia, unspecified: Secondary | ICD-10-CM | POA: Diagnosis not present

## 2015-12-29 LAB — CBC WITH DIFFERENTIAL/PLATELET
BASOS PCT: 0.7 % (ref 0.0–3.0)
Basophils Absolute: 0.1 10*3/uL (ref 0.0–0.1)
EOS PCT: 0.6 % (ref 0.0–5.0)
Eosinophils Absolute: 0 10*3/uL (ref 0.0–0.7)
HEMATOCRIT: 27.6 % — AB (ref 39.0–52.0)
HEMOGLOBIN: 9.3 g/dL — AB (ref 13.0–17.0)
Lymphocytes Relative: 15.1 % (ref 12.0–46.0)
Lymphs Abs: 1.1 10*3/uL (ref 0.7–4.0)
MCHC: 33.6 g/dL (ref 30.0–36.0)
MCV: 101 fl — ABNORMAL HIGH (ref 78.0–100.0)
MONO ABS: 1.3 10*3/uL — AB (ref 0.1–1.0)
Monocytes Relative: 17.4 % — ABNORMAL HIGH (ref 3.0–12.0)
NEUTROS ABS: 4.8 10*3/uL (ref 1.4–7.7)
Neutrophils Relative %: 66.2 % (ref 43.0–77.0)
PLATELETS: 213 10*3/uL (ref 150.0–400.0)
RBC: 2.73 Mil/uL — ABNORMAL LOW (ref 4.22–5.81)
RDW: 18.5 % — AB (ref 11.5–15.5)
WBC: 7.2 10*3/uL (ref 4.0–10.5)

## 2016-01-02 ENCOUNTER — Telehealth: Payer: Self-pay | Admitting: Family Medicine

## 2016-01-02 NOTE — Telephone Encounter (Signed)
Pt is aware waiting on md °

## 2016-01-02 NOTE — Telephone Encounter (Signed)
Pt would like to have his lab results

## 2016-01-04 DIAGNOSIS — I209 Angina pectoris, unspecified: Secondary | ICD-10-CM | POA: Diagnosis not present

## 2016-01-04 DIAGNOSIS — Z952 Presence of prosthetic heart valve: Secondary | ICD-10-CM | POA: Diagnosis not present

## 2016-01-04 DIAGNOSIS — I350 Nonrheumatic aortic (valve) stenosis: Secondary | ICD-10-CM | POA: Diagnosis not present

## 2016-01-04 DIAGNOSIS — R0602 Shortness of breath: Secondary | ICD-10-CM | POA: Diagnosis not present

## 2016-01-04 DIAGNOSIS — E785 Hyperlipidemia, unspecified: Secondary | ICD-10-CM | POA: Diagnosis not present

## 2016-01-04 DIAGNOSIS — G4733 Obstructive sleep apnea (adult) (pediatric): Secondary | ICD-10-CM | POA: Diagnosis not present

## 2016-01-04 DIAGNOSIS — I2510 Atherosclerotic heart disease of native coronary artery without angina pectoris: Secondary | ICD-10-CM | POA: Diagnosis not present

## 2016-01-04 DIAGNOSIS — K219 Gastro-esophageal reflux disease without esophagitis: Secondary | ICD-10-CM | POA: Diagnosis not present

## 2016-01-04 NOTE — Telephone Encounter (Signed)
Pt calling again to get lab results. 

## 2016-01-04 NOTE — Telephone Encounter (Signed)
Pt is aware of lab results.

## 2016-01-15 DIAGNOSIS — H2511 Age-related nuclear cataract, right eye: Secondary | ICD-10-CM | POA: Diagnosis not present

## 2016-01-16 ENCOUNTER — Telehealth: Payer: Self-pay | Admitting: Family Medicine

## 2016-01-16 DIAGNOSIS — H2512 Age-related nuclear cataract, left eye: Secondary | ICD-10-CM | POA: Diagnosis not present

## 2016-01-16 MED ORDER — DOXAZOSIN MESYLATE 8 MG PO TABS: 8.0000 mg | ORAL_TABLET | Freq: Every day | ORAL | 5 refills | Status: AC

## 2016-01-16 NOTE — Telephone Encounter (Signed)
Medication sent in for patient. 

## 2016-01-16 NOTE — Telephone Encounter (Signed)
Patient states he lost his prescription for Doxazosin and wants to get a replacement.  Pharmacy said he filled it but he can't find it and he is out of the medication.   Pharmacy: [name] in [place]

## 2016-01-29 DIAGNOSIS — H2512 Age-related nuclear cataract, left eye: Secondary | ICD-10-CM | POA: Diagnosis not present

## 2016-02-19 DIAGNOSIS — L570 Actinic keratosis: Secondary | ICD-10-CM | POA: Diagnosis not present

## 2016-02-19 DIAGNOSIS — X32XXXD Exposure to sunlight, subsequent encounter: Secondary | ICD-10-CM | POA: Diagnosis not present

## 2016-02-19 DIAGNOSIS — L821 Other seborrheic keratosis: Secondary | ICD-10-CM | POA: Diagnosis not present

## 2016-02-19 DIAGNOSIS — D225 Melanocytic nevi of trunk: Secondary | ICD-10-CM | POA: Diagnosis not present

## 2016-04-08 DIAGNOSIS — I350 Nonrheumatic aortic (valve) stenosis: Secondary | ICD-10-CM | POA: Diagnosis not present

## 2016-04-08 DIAGNOSIS — N401 Enlarged prostate with lower urinary tract symptoms: Secondary | ICD-10-CM | POA: Diagnosis not present

## 2016-04-08 DIAGNOSIS — E785 Hyperlipidemia, unspecified: Secondary | ICD-10-CM | POA: Diagnosis not present

## 2016-04-08 DIAGNOSIS — G4733 Obstructive sleep apnea (adult) (pediatric): Secondary | ICD-10-CM | POA: Diagnosis not present

## 2016-04-08 DIAGNOSIS — I2510 Atherosclerotic heart disease of native coronary artery without angina pectoris: Secondary | ICD-10-CM | POA: Diagnosis not present

## 2016-04-08 DIAGNOSIS — Z952 Presence of prosthetic heart valve: Secondary | ICD-10-CM | POA: Diagnosis not present

## 2016-04-08 DIAGNOSIS — I359 Nonrheumatic aortic valve disorder, unspecified: Secondary | ICD-10-CM | POA: Diagnosis not present

## 2016-04-08 DIAGNOSIS — K219 Gastro-esophageal reflux disease without esophagitis: Secondary | ICD-10-CM | POA: Diagnosis not present

## 2016-04-08 DIAGNOSIS — R351 Nocturia: Secondary | ICD-10-CM | POA: Diagnosis not present

## 2016-04-15 DIAGNOSIS — H02834 Dermatochalasis of left upper eyelid: Secondary | ICD-10-CM | POA: Diagnosis not present

## 2016-04-15 DIAGNOSIS — H02423 Myogenic ptosis of bilateral eyelids: Secondary | ICD-10-CM | POA: Diagnosis not present

## 2016-04-15 DIAGNOSIS — H02831 Dermatochalasis of right upper eyelid: Secondary | ICD-10-CM | POA: Diagnosis not present

## 2016-04-15 DIAGNOSIS — H02413 Mechanical ptosis of bilateral eyelids: Secondary | ICD-10-CM | POA: Diagnosis not present

## 2016-04-16 ENCOUNTER — Ambulatory Visit (INDEPENDENT_AMBULATORY_CARE_PROVIDER_SITE_OTHER): Payer: Medicare Other | Admitting: Family Medicine

## 2016-04-16 ENCOUNTER — Encounter: Payer: Self-pay | Admitting: Family Medicine

## 2016-04-16 DIAGNOSIS — D649 Anemia, unspecified: Secondary | ICD-10-CM | POA: Diagnosis not present

## 2016-04-16 DIAGNOSIS — D539 Nutritional anemia, unspecified: Secondary | ICD-10-CM | POA: Insufficient documentation

## 2016-04-16 LAB — CBC WITH DIFFERENTIAL/PLATELET
BASOS ABS: 0.1 10*3/uL (ref 0.0–0.1)
Basophils Relative: 1.1 % (ref 0.0–3.0)
EOS PCT: 1.5 % (ref 0.0–5.0)
Eosinophils Absolute: 0.1 10*3/uL (ref 0.0–0.7)
HCT: 29.6 % — ABNORMAL LOW (ref 39.0–52.0)
Hemoglobin: 10.2 g/dL — ABNORMAL LOW (ref 13.0–17.0)
LYMPHS PCT: 22.1 % (ref 12.0–46.0)
Lymphs Abs: 1.1 10*3/uL (ref 0.7–4.0)
MCHC: 34.5 g/dL (ref 30.0–36.0)
MCV: 100.4 fl — AB (ref 78.0–100.0)
MONOS PCT: 18.7 % — AB (ref 3.0–12.0)
Monocytes Absolute: 1 10*3/uL (ref 0.1–1.0)
NEUTROS PCT: 56.6 % (ref 43.0–77.0)
Neutro Abs: 2.9 10*3/uL (ref 1.4–7.7)
Platelets: 290 10*3/uL (ref 150.0–400.0)
RBC: 2.95 Mil/uL — AB (ref 4.22–5.81)
RDW: 17.7 % — ABNORMAL HIGH (ref 11.5–15.5)
WBC: 5.1 10*3/uL (ref 4.0–10.5)

## 2016-04-16 NOTE — Patient Instructions (Signed)
We will call you with lab results and hopefully can stop the iron.

## 2016-04-16 NOTE — Progress Notes (Signed)
Subjective:     [name], male   DOB: June 20, 1928, 83 y.o.   MRN: [id number]  HPI Patient seen for follow-up regarding anemia. He presented in August with acute febrile illness in the setting of recent tick bite and we strongly suspected [facility] spotted fever. He had multiple things that went along with this. He promptly improved with doxycycline. His hemoglobin dropped as low as 8.8 with MCV 101. He started some iron replacement. Feels better at this time. He denies dizziness. No abdominal pain. No melena. No hematemesis. Appetite is good. He had colonoscopy around 2008  Past Medical History:  Diagnosis Date  . ANGULAR CHEILITIS 04/11/2009   Qualifier: Diagnosis of  By: Joyce Gross    . APHTHOUS ULCERS 12/19/2009   Qualifier: Diagnosis of  By: [name] MD, Bruce    . BENIGN PROSTATIC HYPERTROPHY, HX OF 08/21/2007   Qualifier: Diagnosis of  By: Jerral Ralph    . CERUMEN IMPACTION 04/11/2009   Qualifier: Diagnosis of  By: Joyce Gross    . COLITIS, HX OF 08/21/2007   Qualifier: Diagnosis of  By: Jerral Ralph    . COLONIC POLYPS, HX OF 12/02/2008   Qualifier: Diagnosis of  By: [name] MD, Bruce    . DIVERTICULOSIS, COLON 08/28/2006   Qualifier: Diagnosis of  By: Jerral Ralph    . GERD 12/02/2008   Qualifier: Diagnosis of  By: [name] MD, Bruce    . HYPERLIPIDEMIA 04/11/2009   Qualifier: Diagnosis of  By: Joyce Gross    . INTERNAL HEMORRHOIDS 08/28/2006   Qualifier: Diagnosis of  By: Jerral Ralph    . Nocturia   . OSTEOPOROSIS 08/21/2007   Qualifier: Diagnosis of  By: Jerral Ralph    . Shortness of breath dyspnea    with exertion  . SLEEP APNEA 08/21/2007   Qualifier: Diagnosis of  By: Jerral Ralph     Past Surgical History:  Procedure Laterality Date  . CARDIAC CATHETERIZATION N/A 07/25/2015   Procedure: Right/Left Heart Cath and Coronary Angiography;  Surgeon: Sherren Mocha, MD;  Location: [place] [facility];  Service: Cardiovascular;  Laterality: N/A;  . COLONOSCOPY    . [place]  2001  . SKIN CANCER EXCISION     left leg  . TEE WITHOUT CARDIOVERSION N/A 08/08/2015   Procedure: TRANSESOPHAGEAL ECHOCARDIOGRAM (TEE);  Surgeon: Sherren Mocha, MD;  Location: [place];  Service: Open Heart Surgery;  Laterality: N/A;  . TRANSCATHETER AORTIC VALVE REPLACEMENT, TRANSFEMORAL N/A 08/08/2015   Procedure: TRANSCATHETER AORTIC VALVE REPLACEMENT, TRANSFEMORAL;  Surgeon: Sherren Mocha, MD;  Location: [place];  Service: Open Heart Surgery;  Laterality: N/A;    reports that he has never smoked. He has never used smokeless tobacco. He reports that he does not drink alcohol or use drugs. family history includes Asthma in his mother; Heart disease in his other. Allergies  Allergen Reactions  . Neosporin [Neomycin-Bacitracin Zn-Polymyx] Rash     Review of Systems  Constitutional: Negative for appetite change, chills, fever and unexpected weight change.  Respiratory: Negative for shortness of breath.   Cardiovascular: Negative for chest pain.  Gastrointestinal: Negative for abdominal pain and blood in stool.  Neurological: Negative for dizziness and headaches.       Objective:   Physical Exam  Constitutional: He is oriented to person, place, and time. He appears well-developed and well-nourished.  Eyes:  Conjunctivae are pink  Cardiovascular: Normal rate and regular rhythm.   Pulmonary/Chest: Effort normal and breath sounds normal. No respiratory  distress. He has no wheezes. He has no rales.  Musculoskeletal: He exhibits no edema.  Neurological: He is alert and oriented to person, place, and time.       Assessment:     Anemia. Basically normocytic in the setting of recent acute illness with suspected [facility] spotted fever. Clinically stable at this time.  He also had some transient thrombocytopenia likely related to [facility] spotted fever    Plan:     -Recheck CBC -Consider  further evaluation of not improving  [name] MD [facility] at [facility]

## 2016-04-16 NOTE — Progress Notes (Signed)
Pre visit review using our clinic review tool, if applicable. No additional management support is needed unless otherwise documented below in the visit note. 

## 2016-04-18 ENCOUNTER — Other Ambulatory Visit: Payer: Self-pay

## 2016-04-18 ENCOUNTER — Telehealth: Payer: Self-pay | Admitting: Family Medicine

## 2016-04-18 DIAGNOSIS — H02423 Myogenic ptosis of bilateral eyelids: Secondary | ICD-10-CM | POA: Diagnosis not present

## 2016-04-18 DIAGNOSIS — E785 Hyperlipidemia, unspecified: Secondary | ICD-10-CM

## 2016-04-18 DIAGNOSIS — H53483 Generalized contraction of visual field, bilateral: Secondary | ICD-10-CM | POA: Diagnosis not present

## 2016-04-18 DIAGNOSIS — D649 Anemia, unspecified: Secondary | ICD-10-CM

## 2016-04-18 DIAGNOSIS — R718 Other abnormality of red blood cells: Secondary | ICD-10-CM

## 2016-04-18 NOTE — Telephone Encounter (Signed)
Pt returned your call concerning lab results.  Scheduled pt for repeat in 3 months, but pt would like a more thorough report if you can call back.

## 2016-04-19 NOTE — Telephone Encounter (Signed)
Pt is aware of results. 

## 2016-04-22 ENCOUNTER — Encounter: Payer: Self-pay | Admitting: Family Medicine

## 2016-04-22 ENCOUNTER — Other Ambulatory Visit: Payer: Self-pay | Admitting: *Deleted

## 2016-04-22 DIAGNOSIS — R911 Solitary pulmonary nodule: Secondary | ICD-10-CM

## 2016-05-06 DIAGNOSIS — H02413 Mechanical ptosis of bilateral eyelids: Secondary | ICD-10-CM | POA: Diagnosis not present

## 2016-05-06 DIAGNOSIS — H02831 Dermatochalasis of right upper eyelid: Secondary | ICD-10-CM | POA: Diagnosis not present

## 2016-05-06 DIAGNOSIS — H02834 Dermatochalasis of left upper eyelid: Secondary | ICD-10-CM | POA: Diagnosis not present

## 2016-05-06 DIAGNOSIS — H02423 Myogenic ptosis of bilateral eyelids: Secondary | ICD-10-CM | POA: Diagnosis not present

## 2016-05-15 ENCOUNTER — Encounter: Payer: Self-pay | Admitting: Surgery

## 2016-05-15 ENCOUNTER — Ambulatory Visit
Admission: RE | Admit: 2016-05-15 | Discharge: 2016-05-15 | Disposition: A | Discharge status: Home / Self Care | Payer: Medicare Other | Source: Ambulatory Visit | Attending: Surgery | Admitting: Surgery

## 2016-05-15 ENCOUNTER — Ambulatory Visit (INDEPENDENT_AMBULATORY_CARE_PROVIDER_SITE_OTHER): Payer: Medicare Other | Admitting: Surgery

## 2016-05-15 VITALS — BP 145/59 | HR 66 | Resp 16 | Ht 62.0 in | Wt 139.2 lb

## 2016-05-15 DIAGNOSIS — Z953 Presence of xenogenic heart valve: Secondary | ICD-10-CM | POA: Diagnosis not present

## 2016-05-15 DIAGNOSIS — R918 Other nonspecific abnormal finding of lung field: Secondary | ICD-10-CM

## 2016-05-15 DIAGNOSIS — R911 Solitary pulmonary nodule: Secondary | ICD-10-CM

## 2016-05-16 ENCOUNTER — Encounter: Payer: Self-pay | Admitting: Surgery

## 2016-05-16 NOTE — Progress Notes (Signed)
HPI:  The patient returns today to follow up on two left lung nodules found incidentally on his chest CT during his TAVR work-up. He underwent TAVR on 08/08/2015 and has done well with much improvement in his breathing and stamina. He is very active and walking without chest pain or dyspnea. He denies cough, sputum production or hemoptysis. He recently had blepharoplasty due to sagging eyelids that were hindering his vision.  He had an echo on 04/08/2016 that showed a normally function prosthetic aortic valve with a mean gradient of 8.4 mm Hg and a peak of 16.4 mm Hg with mild periprosthetic AI.  Current Outpatient Prescriptions  Medication Sig Dispense Refill  . aspirin 81 MG tablet Take 81 mg by mouth daily.    . Calcium Citrate 250 MG TABS Take 500 mg by mouth 2 (two) times daily.    . Cholecalciferol (VITAMIN D3) 5000 units TABS Take 5,000 Units by mouth daily.    . clopidogrel (PLAVIX) 75 MG tablet Take 1 tablet (75 mg total) by mouth daily with breakfast. 90 tablet 1  . doxazosin (CARDURA) 8 MG tablet Take 1 tablet (8 mg total) by mouth at bedtime. 30 tablet 5  . finasteride (PROSCAR) 5 MG tablet Take 5 mg by mouth daily.    Marland Kitchen glucosamine-chondroitin 500-400 MG tablet Take 2 tablets by mouth 2 (two) times daily.    . Multiple Vitamin (MULTIVITAMIN) tablet Take 1 tablet by mouth daily.    . NON FORMULARY CPAP machine    . Omega-3 Fatty Acids (FISH OIL) 1000 MG CAPS Take 1,000 mg by mouth daily.    . pantoprazole (PROTONIX) 40 MG tablet Take 1 tablet (40 mg total) by mouth daily. 90 tablet 3  . Probiotic Product (PRO-BIOTIC BLEND) CAPS Take 1 capsule by mouth daily as needed (digestive issues).     . tamsulosin (FLOMAX) 0.4 MG CAPS capsule Take 0.4 mg by mouth daily.      No current facility-administered medications for this visit.      Physical Exam: BP (!) 145/59 (BP Location: Right Arm, Patient Position: Sitting, Cuff Size: Normal)   Pulse 66   Resp 16   Ht 5\' 2"  (1.575 m)    Wt 139 lb 3.2 oz (63.1 kg)   SpO2 99% Comment: ON RA  BMI 25.46 kg/m  He looks well Some residual ecchymosis over cheeks. Lungs are clear Cardiac exam shows a regular rate and rhythm with a 1/6 systolic murmur along the RSB and 1/6 diastolic murmur at the apex. Abdomen is soft and nontender There is no peripheral edema   Diagnostic Tests:  CLINICAL DATA:  Follow-up pulmonary nodules.  EXAM: CT CHEST WITHOUT CONTRAST  TECHNIQUE: Multidetector CT imaging of the chest was performed following the standard protocol without IV contrast.  COMPARISON:  11/15/2015 chest CT.  FINDINGS: Cardiovascular: Top-normal heart size. No significant pericardial fluid/thickening. Aortic valvular prosthesis is in place. Left anterior descending coronary atherosclerosis. Atherosclerotic nonaneurysmal thoracic aorta. Normal caliber pulmonary arteries.  Mediastinum/Nodes: No discrete thyroid nodules. Unremarkable esophagus. No axillary adenopathy. Stable mildly enlarged 1.0 cm right lower paratracheal node (series 3/ image 49) back to 07/31/2015, probably benign. No additional pathologically enlarged mediastinal or gross hilar nodes on this noncontrast study. Stable coarsely calcified right hilar nodes from prior granulomatous disease.  Lungs/Pleura: No pneumothorax. No pleural effusion. Several scattered indistinct nodules in the bilateral upper lobes measuring up to 9 x 5 mm in the left upper lobe (series 4/ image 43), all stable  since 11/15/2015 and stable or decreased since 07/31/2015. New small curvilinear opacities in the dependent basilar right lower lobe (series 4/ image 97) and dependent left lower lobe (series 4/ image 51), favor peripheral mucoid impaction. No acute consolidative airspace disease, lung masses or additional new significant pulmonary nodules.  Upper abdomen: Unremarkable.  Musculoskeletal: No aggressive appearing focal osseous lesions. Moderate thoracic  spondylosis.  IMPRESSION: 1. Interval stability of previously visualized indistinct pulmonary nodules in the bilateral upper lobes, largest 9 x 5 mm in the left upper lobe, for which 9 month stability has been demonstrated, probably benign postinflammatory nodules. 2. Two new small curvilinear lung opacities in the dependent lower lobes, favor peripheral mucoid impaction. 3. Mild right paratracheal lymphadenopathy, for which 9 month stability has been demonstrated, probably benign. 4. Recommend continued chest CT follow-up in 6-12 months. 5. Aortic atherosclerosis.  One vessel coronary atherosclerosis.   Electronically Signed   By: Ilona Sorrel M.D.   On: 05/15/2016 12:32   Impression:  He is doing well following TAVR in 07/2015. His lung nodules in both upper lobes are stable over the past 9 months.  Plan:  I will see him back in 6 months with a CT chest to follow up on the lung nodules. He has 1 year TAVR follow up with Dr. Burt Knack on 08/14/2016.   Gaye Pollack, MD [facility] [phone]

## 2016-06-19 ENCOUNTER — Other Ambulatory Visit: Payer: Self-pay | Admitting: Family Medicine

## 2016-07-08 ENCOUNTER — Telehealth: Payer: Self-pay | Admitting: Cardiovascular Disease

## 2016-07-08 NOTE — Telephone Encounter (Signed)
New message    Pt is calling to find out if he should keep echo appt on 5/23 before his appt with Dr. Burt Knack this day. He said that he had an echo back in January with Dr. Wynonia Lawman.

## 2016-07-08 NOTE — Telephone Encounter (Signed)
Per TAVR guidelines the pt has to have 1 year follow-up appointment and Echocardiogram  305-425 days post procedure, which is 10-14 months. The pt had TAVR 08/08/2015. Left message on machine for pt to contact the office.

## 2016-07-09 NOTE — Telephone Encounter (Signed)
I spoke with the pt about TAVR guidelines. At this time we will leave appointments as scheduled.

## 2016-07-16 ENCOUNTER — Other Ambulatory Visit (INDEPENDENT_AMBULATORY_CARE_PROVIDER_SITE_OTHER): Payer: Medicare Other

## 2016-07-16 DIAGNOSIS — D649 Anemia, unspecified: Secondary | ICD-10-CM | POA: Diagnosis not present

## 2016-07-16 DIAGNOSIS — E785 Hyperlipidemia, unspecified: Secondary | ICD-10-CM | POA: Diagnosis not present

## 2016-07-16 LAB — CBC WITH DIFFERENTIAL/PLATELET
BASOS PCT: 1.3 % (ref 0.0–3.0)
Basophils Absolute: 0.1 10*3/uL (ref 0.0–0.1)
EOS PCT: 0.7 % (ref 0.0–5.0)
Eosinophils Absolute: 0.1 10*3/uL (ref 0.0–0.7)
HEMATOCRIT: 30 % — AB (ref 39.0–52.0)
HEMOGLOBIN: 10.1 g/dL — AB (ref 13.0–17.0)
LYMPHS PCT: 14.5 % (ref 12.0–46.0)
Lymphs Abs: 1.5 10*3/uL (ref 0.7–4.0)
MCHC: 33.8 g/dL (ref 30.0–36.0)
MCV: 101.7 fl — AB (ref 78.0–100.0)
MONOS PCT: 14.3 % — AB (ref 3.0–12.0)
Monocytes Absolute: 1.5 10*3/uL — ABNORMAL HIGH (ref 0.1–1.0)
NEUTROS ABS: 7.3 10*3/uL (ref 1.4–7.7)
Neutrophils Relative %: 69.2 % (ref 43.0–77.0)
PLATELETS: 409 10*3/uL — AB (ref 150.0–400.0)
RBC: 2.95 Mil/uL — ABNORMAL LOW (ref 4.22–5.81)
RDW: 16.6 % — AB (ref 11.5–15.5)
WBC: 10.6 10*3/uL — AB (ref 4.0–10.5)

## 2016-07-16 LAB — TSH: TSH: 4.09 u[IU]/mL (ref 0.35–4.50)

## 2016-07-17 ENCOUNTER — Ambulatory Visit: Payer: Medicare Other | Admitting: Family Medicine

## 2016-07-18 DIAGNOSIS — L821 Other seborrheic keratosis: Secondary | ICD-10-CM | POA: Diagnosis not present

## 2016-07-18 DIAGNOSIS — L820 Inflamed seborrheic keratosis: Secondary | ICD-10-CM | POA: Diagnosis not present

## 2016-07-18 DIAGNOSIS — L570 Actinic keratosis: Secondary | ICD-10-CM | POA: Diagnosis not present

## 2016-07-18 DIAGNOSIS — X32XXXD Exposure to sunlight, subsequent encounter: Secondary | ICD-10-CM | POA: Diagnosis not present

## 2016-07-18 DIAGNOSIS — Z1283 Encounter for screening for malignant neoplasm of skin: Secondary | ICD-10-CM | POA: Diagnosis not present

## 2016-07-19 ENCOUNTER — Ambulatory Visit (INDEPENDENT_AMBULATORY_CARE_PROVIDER_SITE_OTHER)
Admission: RE | Admit: 2016-07-19 | Discharge: 2016-07-19 | Disposition: A | Discharge status: Home / Self Care | Payer: Medicare Other | Source: Ambulatory Visit | Attending: Family Medicine | Admitting: Family Medicine

## 2016-07-19 ENCOUNTER — Ambulatory Visit (INDEPENDENT_AMBULATORY_CARE_PROVIDER_SITE_OTHER): Payer: Medicare Other | Admitting: Family Medicine

## 2016-07-19 ENCOUNTER — Encounter: Payer: Self-pay | Admitting: Family Medicine

## 2016-07-19 VITALS — BP 120/70 | HR 64 | Temp 98.1°F | Wt 138.2 lb

## 2016-07-19 DIAGNOSIS — R059 Cough, unspecified: Secondary | ICD-10-CM

## 2016-07-19 DIAGNOSIS — R05 Cough: Secondary | ICD-10-CM | POA: Diagnosis not present

## 2016-07-19 NOTE — Patient Instructions (Signed)
Follow up for any fever or increased shortness of breath. 

## 2016-07-19 NOTE — Progress Notes (Signed)
Subjective:     [name], male   DOB: 11/24/1928, 83 y.o.   MRN: [id number]  HPI Patient's seen with intermittent cough over the past 3 weeks. He states he had severe sore throat Wednesday but resolved today. He has not at any point had any fever. He was concerned apparently because his sister has MRSA pneumonia and is currently hospitalized. Patient's not had any recent close contacts. He has never smoked. No hemoptysis. No dyspnea. Minimal nasal congestion.  Past Medical History:  Diagnosis Date  . ANGULAR CHEILITIS 04/11/2009   Qualifier: Diagnosis of  By: Joyce Gross    . APHTHOUS ULCERS 12/19/2009   Qualifier: Diagnosis of  By: [name] MD, Jearldean Gutt    . BENIGN PROSTATIC HYPERTROPHY, HX OF 08/21/2007   Qualifier: Diagnosis of  By: Jerral Ralph    . CERUMEN IMPACTION 04/11/2009   Qualifier: Diagnosis of  By: Joyce Gross    . COLITIS, HX OF 08/21/2007   Qualifier: Diagnosis of  By: Jerral Ralph    . COLONIC POLYPS, HX OF 12/02/2008   Qualifier: Diagnosis of  By: [name] MD, Tarek Cravens    . DIVERTICULOSIS, COLON 08/28/2006   Qualifier: Diagnosis of  By: Jerral Ralph    . GERD 12/02/2008   Qualifier: Diagnosis of  By: [name] MD, Wael Maestas    . HYPERLIPIDEMIA 04/11/2009   Qualifier: Diagnosis of  By: Joyce Gross    . INTERNAL HEMORRHOIDS 08/28/2006   Qualifier: Diagnosis of  By: Jerral Ralph    . Nocturia   . OSTEOPOROSIS 08/21/2007   Qualifier: Diagnosis of  By: Jerral Ralph    . Shortness of breath dyspnea    with exertion  . SLEEP APNEA 08/21/2007   Qualifier: Diagnosis of  By: Jerral Ralph     Past Surgical History:  Procedure Laterality Date  . CARDIAC CATHETERIZATION N/A 07/25/2015   Procedure: Right/Left Heart Cath and Coronary Angiography;  Surgeon: Sherren Mocha, MD;  Location: [place] [facility];  Service: Cardiovascular;  Laterality: N/A;  . COLONOSCOPY    . [place]  2001  . SKIN CANCER  EXCISION     left leg  . TEE WITHOUT CARDIOVERSION N/A 08/08/2015   Procedure: TRANSESOPHAGEAL ECHOCARDIOGRAM (TEE);  Surgeon: Sherren Mocha, MD;  Location: [place];  Service: Open Heart Surgery;  Laterality: N/A;  . TRANSCATHETER AORTIC VALVE REPLACEMENT, TRANSFEMORAL N/A 08/08/2015   Procedure: TRANSCATHETER AORTIC VALVE REPLACEMENT, TRANSFEMORAL;  Surgeon: Sherren Mocha, MD;  Location: [place];  Service: Open Heart Surgery;  Laterality: N/A;    reports that he has never smoked. He has never used smokeless tobacco. He reports that he does not drink alcohol or use drugs. family history includes Asthma in his mother; Heart disease in his other. Allergies  Allergen Reactions  . Neosporin [Neomycin-Bacitracin Zn-Polymyx] Rash     Review of Systems  Constitutional: Negative for chills and fever.  HENT: Negative for congestion.   Respiratory: Positive for cough. Negative for shortness of breath and wheezing.   Cardiovascular: Negative for chest pain, palpitations and leg swelling.       Objective:   Physical Exam  Constitutional: He appears well-developed and well-nourished.  HENT:  Right Ear: External ear normal.  Left Ear: External ear normal.  Mouth/Throat: Oropharynx is clear and moist.  Neck: Neck supple.  Cardiovascular: Normal rate and regular rhythm.   Pulmonary/Chest: Effort normal. No respiratory distress. He has no wheezes. He has no rales.  Musculoskeletal: He exhibits no edema.  Assessment:     Cough. Patient's does not have any fever, dyspnea, or other concerns and non-focal exam.    Plan:     -Go ahead with chest x-ray given duration of cough and his age. -Follow-up immediately for any fever or increasing shortness of breath  [name] MD [facility] at [facility]  CXR- no acute findings. Pt notified.  [name] MD [facility] at [facility]

## 2016-07-19 NOTE — Progress Notes (Signed)
Pre visit review using our clinic review tool, if applicable. No additional management support is needed unless otherwise documented below in the visit note. 

## 2016-08-08 DIAGNOSIS — H04123 Dry eye syndrome of bilateral lacrimal glands: Secondary | ICD-10-CM | POA: Diagnosis not present

## 2016-08-08 DIAGNOSIS — Z09 Encounter for follow-up examination after completed treatment for conditions other than malignant neoplasm: Secondary | ICD-10-CM | POA: Diagnosis not present

## 2016-08-14 ENCOUNTER — Encounter: Payer: Self-pay | Admitting: Cardiovascular Disease

## 2016-08-14 ENCOUNTER — Other Ambulatory Visit: Payer: Self-pay

## 2016-08-14 ENCOUNTER — Ambulatory Visit (INDEPENDENT_AMBULATORY_CARE_PROVIDER_SITE_OTHER): Payer: Medicare Other | Admitting: Cardiovascular Disease

## 2016-08-14 ENCOUNTER — Encounter (INDEPENDENT_AMBULATORY_CARE_PROVIDER_SITE_OTHER): Payer: Self-pay

## 2016-08-14 ENCOUNTER — Ambulatory Visit (HOSPITAL_COMMUNITY): Payer: Medicare Other | Attending: Cardiovascular Disease

## 2016-08-14 VITALS — BP 144/60 | HR 61 | Ht 63.0 in | Wt 136.2 lb

## 2016-08-14 DIAGNOSIS — I350 Nonrheumatic aortic (valve) stenosis: Secondary | ICD-10-CM | POA: Insufficient documentation

## 2016-08-14 DIAGNOSIS — Z952 Presence of prosthetic heart valve: Secondary | ICD-10-CM | POA: Insufficient documentation

## 2016-08-14 NOTE — Patient Instructions (Addendum)
Medication Instructions:  Your physician has recommended you make the following change in your medication:  1. STOP Plavix (clopidogrel)  Labwork: No new orders.   Testing/Procedures: No new orders.   Follow-Up: Your physician recommends that you continue routine cardiology follow-up with Dr Wynonia Lawman.    Any Other Special Instructions Will Be Listed Below (If Applicable).  Your physician discussed the importance of taking an antibiotic prior to any dental, gastrointestinal, genitourinary procedures to prevent damage to the heart valves from infection.   If you need a refill on your cardiac medications before your next appointment, please call your pharmacy.

## 2016-08-14 NOTE — Progress Notes (Signed)
Cardiology Office Note Date:  08/14/2016   ID:  James Hardy, DOB January 17, 1929, MRN [id number]  PCP:  [name], MD  Cardiologist:  Tollie Eth, MD  Chief Complaint  Patient presents with  . Aortic Stenosis     History of Present Illness: James Hardy is a 83 y.o. male who presents for 1 year TAVR follow-up. The patient underwent TAVR in May 2017 via a right transfemoral approach. He was treated with a 26 mm Sapien 3 valve.  The patient is here alone today. He is doing well. He denies symptoms of chest pain, shortness of breath, leg swelling, heart palpitations, lightheadedness, or syncope. He continues to get out and do yard work. He uses a chainsaw every once a while. He maintains an active lifestyle.  Past Medical History:  Diagnosis Date  . ANGULAR CHEILITIS 04/11/2009   Qualifier: Diagnosis of  By: Joyce Gross    . APHTHOUS ULCERS 12/19/2009   Qualifier: Diagnosis of  By: [name] MD, Bruce    . BENIGN PROSTATIC HYPERTROPHY, HX OF 08/21/2007   Qualifier: Diagnosis of  By: Jerral Ralph    . CERUMEN IMPACTION 04/11/2009   Qualifier: Diagnosis of  By: Joyce Gross    . COLITIS, HX OF 08/21/2007   Qualifier: Diagnosis of  By: Jerral Ralph    . COLONIC POLYPS, HX OF 12/02/2008   Qualifier: Diagnosis of  By: [name] MD, Bruce    . DIVERTICULOSIS, COLON 08/28/2006   Qualifier: Diagnosis of  By: Jerral Ralph    . GERD 12/02/2008   Qualifier: Diagnosis of  By: [name] MD, Bruce    . HYPERLIPIDEMIA 04/11/2009   Qualifier: Diagnosis of  By: Joyce Gross    . INTERNAL HEMORRHOIDS 08/28/2006   Qualifier: Diagnosis of  By: Jerral Ralph    . Nocturia   . OSTEOPOROSIS 08/21/2007   Qualifier: Diagnosis of  By: Jerral Ralph    . Shortness of breath dyspnea    with exertion  . SLEEP APNEA 08/21/2007   Qualifier: Diagnosis of  By: Jerral Ralph      Past Surgical History:  Procedure Laterality Date  . CARDIAC  CATHETERIZATION N/A 07/25/2015   Procedure: Right/Left Heart Cath and Coronary Angiography;  Surgeon: Sherren Mocha, MD;  Location: [place] [facility];  Service: Cardiovascular;  Laterality: N/A;  . COLONOSCOPY    . [place]  2001  . SKIN CANCER EXCISION     left leg  . TEE WITHOUT CARDIOVERSION N/A 08/08/2015   Procedure: TRANSESOPHAGEAL ECHOCARDIOGRAM (TEE);  Surgeon: Sherren Mocha, MD;  Location: [place];  Service: Open Heart Surgery;  Laterality: N/A;  . TRANSCATHETER AORTIC VALVE REPLACEMENT, TRANSFEMORAL N/A 08/08/2015   Procedure: TRANSCATHETER AORTIC VALVE REPLACEMENT, TRANSFEMORAL;  Surgeon: Sherren Mocha, MD;  Location: [place];  Service: Open Heart Surgery;  Laterality: N/A;    Current Outpatient Prescriptions  Medication Sig Dispense Refill  . aspirin 81 MG tablet Take 81 mg by mouth daily.    . Calcium Citrate 250 MG TABS Take 500 mg by mouth 2 (two) times daily.    . Cholecalciferol (VITAMIN D3) 5000 units TABS Take 5,000 Units by mouth daily.    . clopidogrel (PLAVIX) 75 MG tablet TAKE 1 TABLET (75 MG TOTAL) BY MOUTH DAILY WITH BREAKFAST. 90 tablet 0  . doxazosin (CARDURA) 8 MG tablet Take 1 tablet (8 mg total) by mouth at bedtime. 30 tablet 5  . finasteride (PROSCAR) 5 MG tablet Take 5 mg by mouth daily.    Marland Kitchen  glucosamine-chondroitin 500-400 MG tablet Take 2 tablets by mouth 2 (two) times daily.    . Multiple Vitamin (MULTIVITAMIN) tablet Take 1 tablet by mouth daily.    . NON FORMULARY CPAP machine    . Omega-3 Fatty Acids (FISH OIL) 1000 MG CAPS Take 1,000 mg by mouth daily.    . pantoprazole (PROTONIX) 40 MG tablet Take 1 tablet (40 mg total) by mouth daily. 90 tablet 3  . Probiotic Product (PRO-BIOTIC BLEND) CAPS Take 1 capsule by mouth daily as needed (digestive issues).     . tamsulosin (FLOMAX) 0.4 MG CAPS capsule Take 0.4 mg by mouth daily.      No current facility-administered medications for this visit.     Allergies:   Neosporin [neomycin-bacitracin  zn-polymyx]   Social History:  The patient  reports that he has never smoked. He has never used smokeless tobacco. He reports that he does not drink alcohol or use drugs.   Family History:  The patient's  family history includes Asthma in his mother; Heart disease in his other.    ROS:  Please see the history of present illness.  Otherwise, review of systems is positive for easy bruising.  All other systems are reviewed and negative.   PHYSICAL EXAM: VS:  BP (!) 144/60   Pulse 61   Ht 5\' 3"  (1.6 m)   Wt 136 lb 3.2 oz (61.8 kg)   BMI 24.13 kg/m  , BMI Body mass index is 24.13 kg/m. GEN: Well nourished, well developed, in no acute distress  HEENT: normal  Neck: no JVD, no masses.  Cardiac: RRR with a grade 2/6 systolic murmur at the RUSB and a grade 1/6 diastolic decrescendo murmur at the LLSB               Respiratory:  clear to auscultation bilaterally, normal work of breathing GI: soft, nontender, nondistended, + BS MS: no deformity or atrophy  Ext: no pretibial edema, pedal pulses 2+= bilaterally Skin: warm and dry, no rash, diffuse ecchymoses noted Neuro:  Strength and sensation are intact Psych: euthymic mood, full affect  EKG:  EKG is ordered today. The ekg ordered today shows normal sinus rhythm 61 bpm, first-degree AV block, nonspecific IVCD, cannot rule out age-indeterminate septal infarct.  Recent Labs: 11/17/2015: ALT 27; BUN 31; Creatinine, Ser 1.39; Potassium 4.3; Sodium 139 07/16/2016: Hemoglobin 10.1; Platelets 409.0; TSH 4.09   Lipid Panel     Component Value Date/Time   CHOL 110 04/11/2009 0955   TRIG 26.0 04/11/2009 0955   HDL 31.70 (L) 04/11/2009 0955   CHOLHDL 3 04/11/2009 0955   VLDL 5.2 04/11/2009 0955   LDLCALC 73 04/11/2009 0955      Wt Readings from Last 3 Encounters:  08/14/16 136 lb 3.2 oz (61.8 kg)  07/19/16 138 lb 3.2 oz (62.7 kg)  05/15/16 139 lb 3.2 oz (63.1 kg)     Cardiac Studies Reviewed: 2-D echo is performed today. The official  interpretation is pending. I have reviewed the study and it demonstrates normal LV systolic function. The patient's transcatheter heart valve function appears normal with mild paravalvular insufficiency and a mean transvalvular gradient of 17 mmHg.  ASSESSMENT AND PLAN: Aortic valve disease status post TAVR, with New York Heart Association functional class I symptoms. The patient is one year out from TAVR. I recommended that he stop Plavix at this point. He will remain on aspirin long-term. He understands the need for SBE prophylaxis. He will follow-up with Dr. Wynonia Lawman. His transvalvular  gradient remains in the normal range, but I might consider a follow-up echo in 6-12 months since the mean gradient is a little higher than it previously has been.  Current medicines are reviewed with the patient today.  The patient does not have concerns regarding medicines.  Labs/ tests ordered today include:  No orders of the defined types were placed in this encounter.  Disposition:   FU as needed  Signed, Sherren Mocha, MD  08/14/2016 3:13 PM    [facility] [address], [place], [place]  [address] Phone: [phone]; Fax: [phone]

## 2016-08-22 ENCOUNTER — Ambulatory Visit (INDEPENDENT_AMBULATORY_CARE_PROVIDER_SITE_OTHER)
Admission: RE | Admit: 2016-08-22 | Discharge: 2016-08-22 | Disposition: A | Discharge status: Home / Self Care | Payer: Medicare Other | Source: Ambulatory Visit | Attending: Family Medicine | Admitting: Family Medicine

## 2016-08-22 ENCOUNTER — Encounter: Payer: Self-pay | Admitting: Family Medicine

## 2016-08-22 ENCOUNTER — Ambulatory Visit (INDEPENDENT_AMBULATORY_CARE_PROVIDER_SITE_OTHER): Payer: Medicare Other | Admitting: Family Medicine

## 2016-08-22 VITALS — BP 125/59 | HR 57 | Temp 98.3°F | Ht 63.0 in | Wt 134.0 lb

## 2016-08-22 DIAGNOSIS — R0781 Pleurodynia: Secondary | ICD-10-CM | POA: Diagnosis not present

## 2016-08-22 DIAGNOSIS — R079 Chest pain, unspecified: Secondary | ICD-10-CM | POA: Diagnosis not present

## 2016-08-22 NOTE — Patient Instructions (Signed)
WE NOW OFFER   [facility] FAST TRACK!!!  SAME DAY Appointments for ACUTE CARE  Such as: Sprains, Injuries, cuts, abrasions, rashes, muscle pain, joint pain, back pain Colds, flu, sore throats, headache, allergies, cough, fever  Ear pain, sinus and eye infections Abdominal pain, nausea, vomiting, diarrhea, upset stomach Animal/insect bites  3 Easy Ways to Schedule: Walk-In Scheduling Call in scheduling Mychart Sign-up: [email or web address]         

## 2016-08-22 NOTE — Progress Notes (Signed)
   Subjective:    [name], male    DOB: 10-13-1928, 83 y.o.   MRN: [id number]  HPI Here for right upper chest pain after he tripped and feel at home on 08-15-16, landing over a weed trimmer he was using. He applied ice and took some Tylenol, and this helped. He can use the right shoulder without difficulty. No SOB.    Review of Systems  Constitutional: Negative.   Respiratory: Negative.   Cardiovascular: Positive for chest pain. Negative for leg swelling.  Neurological: Negative.        Objective:   Physical Exam  Constitutional: He appears well-developed and well-nourished. No distress.  Neck: No thyromegaly present.  Cardiovascular: Normal rate, regular rhythm, normal heart sounds and intact distal pulses.   Pulmonary/Chest: Effort normal and breath sounds normal. No respiratory distress. He has no wheezes. He has no rales.  He is tender over the right anterior upper ribs. No crepitus   Musculoskeletal:  Right shoulder is normal with no tenderness and full ROM   Lymphadenopathy:    He has no cervical adenopathy.          Assessment & Plan:  Rib contusion. Use Tylenol prn. This should resolve over the next week. We will get Xrays of the ribs today to rule out fractures.  Alysia Penna, MD

## 2016-08-23 ENCOUNTER — Telehealth: Payer: Self-pay | Admitting: Family Medicine

## 2016-08-23 NOTE — Telephone Encounter (Signed)
See my result note.

## 2016-08-23 NOTE — Telephone Encounter (Signed)
Pt had xray yesterday at [facility] for rib pain and would like the results. Pt saw dr fry yesterday

## 2016-09-16 ENCOUNTER — Encounter: Payer: Self-pay | Admitting: Family Medicine

## 2016-09-16 ENCOUNTER — Ambulatory Visit (INDEPENDENT_AMBULATORY_CARE_PROVIDER_SITE_OTHER): Payer: Medicare Other | Admitting: Family Medicine

## 2016-09-16 VITALS — BP 120/60 | HR 58 | Temp 98.7°F | Wt 134.5 lb

## 2016-09-16 DIAGNOSIS — R197 Diarrhea, unspecified: Secondary | ICD-10-CM

## 2016-09-16 DIAGNOSIS — K219 Gastro-esophageal reflux disease without esophagitis: Secondary | ICD-10-CM | POA: Diagnosis not present

## 2016-09-16 MED ORDER — ONDANSETRON 4 MG PO TBDP: 4.0000 mg | ORAL_TABLET | Freq: Three times a day (TID) | ORAL | 0 refills | Status: DC | PRN

## 2016-09-16 NOTE — Patient Instructions (Signed)
Bland Diet A bland diet consists of foods that do not have a lot of fat or fiber. Foods without fat or fiber are easier for the body to digest. They are also less likely to irritate your mouth, throat, stomach, and other parts of your gastrointestinal tract. A bland diet is sometimes called a BRAT diet. What is my plan? Your health care provider or dietitian may recommend specific changes to your diet to prevent and treat your symptoms, such as:  Eating small meals often.  Cooking food until it is soft enough to chew easily.  Chewing your food well.  Drinking fluids slowly.  Not eating foods that are very spicy, sour, or fatty.  Not eating citrus fruits, such as oranges and grapefruit.  What do I need to know about this diet?  Eat a variety of foods from the bland diet food list.  Do not follow a bland diet longer than you have to.  Ask your health care provider whether you should take vitamins. What foods can I eat? Grains  Hot cereals, such as cream of wheat. Bread, crackers, or tortillas made from refined white flour. Rice. Vegetables Canned or cooked vegetables. Mashed or boiled potatoes. Fruits Bananas. Applesauce. Other types of cooked or canned fruit with the skin and seeds removed, such as canned peaches or pears. Meats and Other Protein Sources Scrambled eggs. Creamy peanut butter or other nut butters. Lean, well-cooked meats, such as chicken or fish. Tofu. Soups or broths. Dairy Low-fat dairy products, such as milk, cottage cheese, or yogurt. Beverages Water. Herbal tea. Apple juice. Sweets and Desserts Pudding. Custard. Fruit gelatin. Ice cream. Fats and Oils Mild salad dressings. Canola or olive oil. The items listed above may not be a complete list of allowed foods or beverages. Contact your dietitian for more options. What foods are not recommended? Foods and ingredients that are often not recommended include:  Spicy foods, such as hot sauce or  salsa.  Fried foods.  Sour foods, such as pickled or fermented foods.  Raw vegetables or fruits, especially citrus or berries.  Caffeinated drinks.  Alcohol.  Strongly flavored seasonings or condiments.  The items listed above may not be a complete list of foods and beverages that are not allowed. Contact your dietitian for more information. This information is not intended to replace advice given to you by your health care provider. Make sure you discuss any questions you have with your health care provider. Document Released: 07/03/2015 Document Revised: 08/17/2015 Document Reviewed: 03/23/2014 Elsevier Interactive Patient Education  2018 [employer].  Consider adding Zantac of Pepcid for breakthrough reflux symptoms. Consider short term use only of Imodium.

## 2016-09-16 NOTE — Progress Notes (Signed)
Subjective:     [name], male   DOB: [birth year], 83 y.o.   MRN: [id number]  HPI   Patient has long-standing history of reflux on Protonix. He presents now with some increased reflux symptoms for the past several days and watery nonbloody diarrhea with onset last Thursday. He's had some nausea but no vomiting. No bloody stools. No recent travels. No recent antibiotics. Has some diffuse abdominal cramps and diffuse weakness. Patient has bloated feeling.  Increased GERD/heartburn past few days.  Takes Protonix daily.  No dysphagia.  Wife state he is remaining active. In fact, he went out and mowed on the riding mower this morning. No dizziness.  Past Medical History:  Diagnosis Date  . ANGULAR CHEILITIS 04/11/2009   Qualifier: Diagnosis of  By: Joyce Gross    . APHTHOUS ULCERS 12/19/2009   Qualifier: Diagnosis of  By: [name] MD, Justyce Yeater    . BENIGN PROSTATIC HYPERTROPHY, HX OF 08/21/2007   Qualifier: Diagnosis of  By: Jerral Ralph    . CERUMEN IMPACTION 04/11/2009   Qualifier: Diagnosis of  By: Joyce Gross    . COLITIS, HX OF 08/21/2007   Qualifier: Diagnosis of  By: Jerral Ralph    . COLONIC POLYPS, HX OF 12/02/2008   Qualifier: Diagnosis of  By: [name] MD, Nahshon Reich    . DIVERTICULOSIS, COLON 08/28/2006   Qualifier: Diagnosis of  By: Jerral Ralph    . GERD 12/02/2008   Qualifier: Diagnosis of  By: [name] MD, Danyella Mcginty    . HYPERLIPIDEMIA 04/11/2009   Qualifier: Diagnosis of  By: Joyce Gross    . INTERNAL HEMORRHOIDS 08/28/2006   Qualifier: Diagnosis of  By: Jerral Ralph    . Nocturia   . OSTEOPOROSIS 08/21/2007   Qualifier: Diagnosis of  By: Jerral Ralph    . Shortness of breath dyspnea    with exertion  . SLEEP APNEA 08/21/2007   Qualifier: Diagnosis of  By: Jerral Ralph     Past Surgical History:  Procedure Laterality Date  . CARDIAC CATHETERIZATION N/A 07/25/2015   Procedure: Right/Left Heart Cath and  Coronary Angiography;  Surgeon: Sherren Mocha, MD;  Location: [facility];  Service: Cardiovascular;  Laterality: N/A;  . COLONOSCOPY    . [place]  2001  . SKIN CANCER EXCISION     left leg  . TEE WITHOUT CARDIOVERSION N/A 08/08/2015   Procedure: TRANSESOPHAGEAL ECHOCARDIOGRAM (TEE);  Surgeon: Sherren Mocha, MD;  Location: [facility];  Service: Open Heart Surgery;  Laterality: N/A;  . TRANSCATHETER AORTIC VALVE REPLACEMENT, TRANSFEMORAL N/A 08/08/2015   Procedure: TRANSCATHETER AORTIC VALVE REPLACEMENT, TRANSFEMORAL;  Surgeon: Sherren Mocha, MD;  Location: [facility];  Service: Open Heart Surgery;  Laterality: N/A;    reports that he has never smoked. He has never used smokeless tobacco. He reports that he does not drink alcohol or use drugs. family history includes Asthma in his mother; Heart disease in his other. Allergies  Allergen Reactions  . Neosporin [Neomycin-Bacitracin Zn-Polymyx] Rash     Review of Systems  Constitutional: Positive for fatigue. Negative for chills and fever.  Respiratory: Negative for shortness of breath.   Cardiovascular: Negative for chest pain.  Gastrointestinal: Positive for diarrhea and nausea. Negative for abdominal pain, blood in stool and vomiting.  Genitourinary: Negative for dysuria.  Neurological: Positive for weakness.       Objective:   Physical Exam  Constitutional: He appears well-developed and well-nourished.  HENT:  Mouth/Throat: Oropharynx is clear and moist.  Cardiovascular: Normal  rate and regular rhythm.   Pulmonary/Chest: Effort normal and breath sounds normal. No respiratory distress. He has no wheezes. He has no rales.  Abdominal: Soft. Bowel sounds are normal. He exhibits no distension and no mass. There is no tenderness. There is no rebound and no guarding.  Musculoskeletal: He exhibits no edema.       Assessment:     #1 patient presents with 4 day history of nausea without vomiting and diarrhea. He has not had  any recent travels or recent antibiotics. Suspect viral origin  #2 history of GERD currently not well controlled on Protonix    Plan:     -Recommended bland diet -Supplement Protonix with Zantac or Pepcid as needed -Check basic metabolic panel given severity and duration of diarrhea -Consider stool studies if not improving over the next few days -Follow-up immediately for recurrent vomiting, dizziness, or other concerns -Zofran 4 mg po q 8 hours prn nausea.  [name] MD [facility] at [facility]

## 2016-09-17 ENCOUNTER — Other Ambulatory Visit: Payer: Self-pay | Admitting: Family Medicine

## 2016-09-17 ENCOUNTER — Ambulatory Visit: Payer: Medicare Other | Admitting: Family Medicine

## 2016-09-17 LAB — BASIC METABOLIC PANEL
BUN: 23 mg/dL (ref 6–23)
CHLORIDE: 108 meq/L (ref 96–112)
CO2: 21 meq/L (ref 19–32)
CREATININE: 1.21 mg/dL (ref 0.40–1.50)
Calcium: 7.1 mg/dL — ABNORMAL LOW (ref 8.4–10.5)
GFR: 60.44 mL/min (ref >60.00)
Glucose, Bld: 93 mg/dL (ref 70–99)
Potassium: 4.3 mEq/L (ref 3.5–5.1)
Sodium: 136 mEq/L (ref 135–145)

## 2016-09-18 ENCOUNTER — Other Ambulatory Visit: Payer: Self-pay | Admitting: Family Medicine

## 2016-09-26 ENCOUNTER — Other Ambulatory Visit (INDEPENDENT_AMBULATORY_CARE_PROVIDER_SITE_OTHER): Payer: Medicare Other

## 2016-09-26 LAB — BASIC METABOLIC PANEL
BUN: 24 mg/dL — AB (ref 6–23)
CALCIUM: 8.3 mg/dL — AB (ref 8.4–10.5)
CO2: 29 mEq/L (ref 19–32)
Chloride: 105 mEq/L (ref 96–112)
Creatinine, Ser: 1.28 mg/dL (ref 0.40–1.50)
GFR: 56.64 mL/min — AB (ref >60.00)
GLUCOSE: 97 mg/dL (ref 70–99)
Potassium: 5.5 mEq/L — ABNORMAL HIGH (ref 3.5–5.1)
Sodium: 138 mEq/L (ref 135–145)

## 2016-09-26 LAB — HEPATIC FUNCTION PANEL
ALBUMIN: 3.5 g/dL (ref 3.5–5.2)
ALT: 11 U/L (ref 0–53)
AST: 16 U/L (ref 0–37)
Alkaline Phosphatase: 36 U/L — ABNORMAL LOW (ref 39–117)
Bilirubin, Direct: 0.1 mg/dL (ref 0.0–0.3)
Total Bilirubin: 0.4 mg/dL (ref 0.2–1.2)
Total Protein: 5.6 g/dL — ABNORMAL LOW (ref 6.0–8.3)

## 2016-09-27 ENCOUNTER — Encounter: Payer: Self-pay | Admitting: Family Medicine

## 2016-09-27 ENCOUNTER — Ambulatory Visit (INDEPENDENT_AMBULATORY_CARE_PROVIDER_SITE_OTHER): Payer: Medicare Other | Admitting: Family Medicine

## 2016-09-27 ENCOUNTER — Other Ambulatory Visit: Payer: Self-pay | Admitting: Family Medicine

## 2016-09-27 VITALS — BP 104/60 | HR 63 | Temp 98.2°F | Ht 63.0 in | Wt 138.1 lb

## 2016-09-27 DIAGNOSIS — K4090 Unilateral inguinal hernia, without obstruction or gangrene, not specified as recurrent: Secondary | ICD-10-CM | POA: Diagnosis not present

## 2016-09-27 NOTE — Progress Notes (Signed)
Subjective:     [name], male   DOB: 1929/01/03, 83 y.o.   MRN: [id number]  HPI   Patient seen with concern for possible hernia. Noted swelling last week . Increased with lifting/strain.  He has some mild discomfort for couple days but none since then. No prior history of hernia.  Patient had recent gastroenteritis type illness. Those symptoms are fully resolved. He had calcium of 7.1 and we had him return for follow-up labs yesterday. His corrected calcium for albumin was 8.8 which is normal range.  Past Medical History:  Diagnosis Date  . ANGULAR CHEILITIS 04/11/2009   Qualifier: Diagnosis of  By: Joyce Gross    . APHTHOUS ULCERS 12/19/2009   Qualifier: Diagnosis of  By: [name] MD, Eather Chaires    . BENIGN PROSTATIC HYPERTROPHY, HX OF 08/21/2007   Qualifier: Diagnosis of  By: Jerral Ralph    . CERUMEN IMPACTION 04/11/2009   Qualifier: Diagnosis of  By: Joyce Gross    . COLITIS, HX OF 08/21/2007   Qualifier: Diagnosis of  By: Jerral Ralph    . COLONIC POLYPS, HX OF 12/02/2008   Qualifier: Diagnosis of  By: [name] MD, Katrece Roediger    . DIVERTICULOSIS, COLON 08/28/2006   Qualifier: Diagnosis of  By: Jerral Ralph    . GERD 12/02/2008   Qualifier: Diagnosis of  By: [name] MD, Ikran Patman    . HYPERLIPIDEMIA 04/11/2009   Qualifier: Diagnosis of  By: Joyce Gross    . INTERNAL HEMORRHOIDS 08/28/2006   Qualifier: Diagnosis of  By: Jerral Ralph    . Nocturia   . OSTEOPOROSIS 08/21/2007   Qualifier: Diagnosis of  By: Jerral Ralph    . Shortness of breath dyspnea    with exertion  . SLEEP APNEA 08/21/2007   Qualifier: Diagnosis of  By: Jerral Ralph     Past Surgical History:  Procedure Laterality Date  . CARDIAC CATHETERIZATION N/A 07/25/2015   Procedure: Right/Left Heart Cath and Coronary Angiography;  Surgeon: Sherren Mocha, MD;  Location: [facility];  Service: Cardiovascular;  Laterality: N/A;  . COLONOSCOPY    .  [place]  2001  . SKIN CANCER EXCISION     left leg  . TEE WITHOUT CARDIOVERSION N/A 08/08/2015   Procedure: TRANSESOPHAGEAL ECHOCARDIOGRAM (TEE);  Surgeon: Sherren Mocha, MD;  Location: [facility];  Service: Open Heart Surgery;  Laterality: N/A;  . TRANSCATHETER AORTIC VALVE REPLACEMENT, TRANSFEMORAL N/A 08/08/2015   Procedure: TRANSCATHETER AORTIC VALVE REPLACEMENT, TRANSFEMORAL;  Surgeon: Sherren Mocha, MD;  Location: [facility];  Service: Open Heart Surgery;  Laterality: N/A;    reports that he has never smoked. He has never used smokeless tobacco. He reports that he does not drink alcohol or use drugs. family history includes Asthma in his mother; Heart disease in his other. Allergies  Allergen Reactions  . Neosporin [Neomycin-Bacitracin Zn-Polymyx] Rash     Review of Systems  Constitutional: Negative for chills and fever.  Genitourinary: Negative for dysuria.       Objective:   Physical Exam  Constitutional: He appears well-developed and well-nourished.  Cardiovascular: Normal rate and regular rhythm.   Genitourinary:  Genitourinary Comments: Patient has left inguinal hernia. This is soft and nontender. No right-sided hernia. No adenopathy.        Assessment:     Left inguinal hernia    Plan:     -Offered surgical referral at this point patient is not sure he wishes to have anything done with this. He prefers observation -  Reviewed signs and symptoms of strangulation and patient will observe and follow-up immediately if any changes  [name] MD [facility] at [facility]

## 2016-09-27 NOTE — Patient Instructions (Signed)

## 2016-10-03 ENCOUNTER — Other Ambulatory Visit: Payer: Self-pay | Admitting: Surgery

## 2016-10-03 DIAGNOSIS — R918 Other nonspecific abnormal finding of lung field: Secondary | ICD-10-CM

## 2016-10-08 DIAGNOSIS — E785 Hyperlipidemia, unspecified: Secondary | ICD-10-CM | POA: Diagnosis not present

## 2016-10-08 DIAGNOSIS — K219 Gastro-esophageal reflux disease without esophagitis: Secondary | ICD-10-CM | POA: Diagnosis not present

## 2016-10-08 DIAGNOSIS — Z952 Presence of prosthetic heart valve: Secondary | ICD-10-CM | POA: Diagnosis not present

## 2016-10-08 DIAGNOSIS — G4733 Obstructive sleep apnea (adult) (pediatric): Secondary | ICD-10-CM | POA: Diagnosis not present

## 2016-10-08 DIAGNOSIS — I2510 Atherosclerotic heart disease of native coronary artery without angina pectoris: Secondary | ICD-10-CM | POA: Diagnosis not present

## 2016-10-08 DIAGNOSIS — I359 Nonrheumatic aortic valve disorder, unspecified: Secondary | ICD-10-CM | POA: Diagnosis not present

## 2016-10-08 DIAGNOSIS — I350 Nonrheumatic aortic (valve) stenosis: Secondary | ICD-10-CM | POA: Diagnosis not present

## 2016-10-11 ENCOUNTER — Other Ambulatory Visit: Payer: Self-pay

## 2016-10-21 ENCOUNTER — Telehealth: Payer: Self-pay | Admitting: Family Medicine

## 2016-10-21 NOTE — Telephone Encounter (Signed)
About 6 weeks

## 2016-10-21 NOTE — Telephone Encounter (Signed)
Patient is aware and will call back when he is ready for surgery.

## 2016-10-21 NOTE — Telephone Encounter (Signed)
Pt was seen on 7-6 for hernia issue. Pt forget what time frame md told him it will take him to recovery from surgery. Pt is not schedule for surgery

## 2016-10-28 ENCOUNTER — Telehealth: Payer: Self-pay | Admitting: Family Medicine

## 2016-10-28 DIAGNOSIS — K4090 Unilateral inguinal hernia, without obstruction or gangrene, not specified as recurrent: Secondary | ICD-10-CM

## 2016-10-28 NOTE — Telephone Encounter (Signed)
° ° ° ° °  Pt call to say he would like to have that referral to have hernia surgery. James Hardy he is having a lot of discomfort

## 2016-10-28 NOTE — Telephone Encounter (Signed)
Referral placed.

## 2016-11-04 ENCOUNTER — Telehealth: Payer: Self-pay | Admitting: Family Medicine

## 2016-11-04 NOTE — Telephone Encounter (Signed)
If Imodium not helping really needs to be seen for further evaluation. There are many prescriptions for diarrhea. Lomotil is for diarrhea but would recommend evaluation before prescribing

## 2016-11-04 NOTE — Telephone Encounter (Signed)
Spoke with patient and he "feeling a lot better today".  Patient will call back if needed.

## 2016-11-04 NOTE — Telephone Encounter (Signed)
Pt has had diarrhea all night, up all night. Would like to know if dr will call in something for diarrhea? Pt declined appt., states he was here not long ago, and has not had any sleep. Pt states imodium is not working.   [name] - [name], [place] - [address]

## 2016-11-05 ENCOUNTER — Encounter: Payer: Self-pay | Admitting: Family Medicine

## 2016-11-05 ENCOUNTER — Telehealth: Payer: Self-pay | Admitting: *Deleted

## 2016-11-05 ENCOUNTER — Ambulatory Visit (INDEPENDENT_AMBULATORY_CARE_PROVIDER_SITE_OTHER): Payer: Medicare Other | Admitting: Family Medicine

## 2016-11-05 VITALS — BP 130/80 | HR 68 | Temp 98.2°F

## 2016-11-05 DIAGNOSIS — R197 Diarrhea, unspecified: Secondary | ICD-10-CM | POA: Diagnosis not present

## 2016-11-05 MED ORDER — DIPHENOXYLATE-ATROPINE 2.5-0.025 MG PO TABS: 1.0000 | ORAL_TABLET | Freq: Four times a day (QID) | ORAL | 0 refills | Status: DC | PRN

## 2016-11-05 MED ORDER — ONDANSETRON 4 MG PO TBDP: 4.0000 mg | ORAL_TABLET | Freq: Three times a day (TID) | ORAL | 0 refills | Status: DC | PRN

## 2016-11-05 NOTE — Patient Instructions (Signed)
Food Choices to Help Relieve Diarrhea, Adult When you have diarrhea, the foods you eat and your eating habits are very important. Choosing the right foods and drinks can help:  Relieve diarrhea.  Replace lost fluids and nutrients.  Prevent dehydration.  What general guidelines should I follow? Relieving diarrhea  Choose foods with less than 2 g or .07 oz. of fiber per serving.  Limit fats to less than 8 tsp (38 g or 1.34 oz.) a day.  Avoid the following: ? Foods and beverages sweetened with high-fructose corn syrup, honey, or sugar alcohols such as xylitol, sorbitol, and mannitol. ? Foods that contain a lot of fat or sugar. ? Fried, greasy, or spicy foods. ? High-fiber grains, breads, and cereals. ? Raw fruits and vegetables.  Eat foods that are rich in probiotics. These foods include dairy products such as yogurt and fermented milk products. They help increase healthy bacteria in the stomach and intestines (gastrointestinal tract, or GI tract).  If you have lactose intolerance, avoid dairy products. These may make your diarrhea worse.  Take medicine to help stop diarrhea (antidiarrheal medicine) only as told by your health care provider. Replacing nutrients  Eat small meals or snacks every 3-4 hours.  Eat bland foods, such as white rice, toast, or baked potato, until your diarrhea starts to get better. Gradually reintroduce nutrient-rich foods as tolerated or as told by your health care provider. This includes: ? Well-cooked protein foods. ? Peeled, seeded, and soft-cooked fruits and vegetables. ? Low-fat dairy products.  Take vitamin and mineral supplements as told by your health care provider. Preventing dehydration   Start by sipping water or a special solution to prevent dehydration (oral rehydration solution, ORS). Urine that is clear or pale yellow means that you are getting enough fluid.  Try to drink at least 8-10 cups of fluid each day to help replace lost  fluids.  You may add other liquids in addition to water, such as clear juice or decaffeinated sports drinks, as tolerated or as told by your health care provider.  Avoid drinks with caffeine, such as coffee, tea, or soft drinks.  Avoid alcohol. What foods are recommended? The items listed may not be a complete list. Talk with your health care provider about what dietary choices are best for you. Grains White rice. White, French, or pita breads (fresh or toasted), including plain rolls, buns, or bagels. White pasta. Saltine, soda, or graham crackers. Pretzels. Low-fiber cereal. Cooked cereals made with water (such as cornmeal, farina, or cream cereals). Plain muffins. Matzo. Melba toast. Zwieback. Vegetables Potatoes (without the skin). Most well-cooked and canned vegetables without skins or seeds. Tender lettuce. Fruits Apple sauce. Fruits canned in juice. Cooked apricots, cherries, grapefruit, peaches, pears, or plums. Fresh bananas and cantaloupe. Meats and other protein foods Baked or boiled chicken. Eggs. Tofu. Fish. Seafood. Smooth nut butters. Ground or well-cooked tender beef, ham, veal, lamb, pork, or poultry. Dairy Plain yogurt, kefir, and unsweetened liquid yogurt. Lactose-free milk, buttermilk, skim milk, or soy milk. Low-fat or nonfat hard cheese. Beverages Water. Low-calorie sports drinks. Fruit juices without pulp. Strained tomato and vegetable juices. Decaffeinated teas. Sugar-free beverages not sweetened with sugar alcohols. Oral rehydration solutions, if approved by your health care provider. Seasoning and other foods Bouillon, broth, or soups made from recommended foods. What foods are not recommended? The items listed may not be a complete list. Talk with your health care provider about what dietary choices are best for you. Grains Whole grain, whole wheat,   bran, or rye breads, rolls, pastas, and crackers. Wild or brown rice. Whole grain or bran cereals. Barley. Oats and  oatmeal. Corn tortillas or taco shells. Granola. Popcorn. Vegetables Raw vegetables. Fried vegetables. Cabbage, broccoli, Brussels sprouts, artichokes, baked beans, beet greens, corn, kale, legumes, peas, sweet potatoes, and yams. Potato skins. Cooked spinach and cabbage. Fruits Dried fruit, including raisins and dates. Raw fruits. Stewed or dried prunes. Canned fruits with syrup. Meat and other protein foods Fried or fatty meats. Deli meats. Chunky nut butters. Nuts and seeds. Beans and lentils. Bacon. Hot dogs. Sausage. Dairy High-fat cheeses. Whole milk, chocolate milk, and beverages made with milk, such as milk shakes. Half-and-half. Cream. sour cream. Ice cream. Beverages Caffeinated beverages (such as coffee, tea, soda, or energy drinks). Alcoholic beverages. Fruit juices with pulp. Prune juice. Soft drinks sweetened with high-fructose corn syrup or sugar alcohols. High-calorie sports drinks. Fats and oils Butter. Cream sauces. Margarine. Salad oils. Plain salad dressings. Olives. Avocados. Mayonnaise. Sweets and desserts Sweet rolls, doughnuts, and sweet breads. Sugar-free desserts sweetened with sugar alcohols such as xylitol and sorbitol. Seasoning and other foods Honey. Hot sauce. Chili powder. Gravy. Cream-based or milk-based soups. Pancakes and waffles. Summary  When you have diarrhea, the foods you eat and your eating habits are very important.  Make sure you get at least 8-10 cups of fluid each day, or enough to keep your urine clear or pale yellow.  Eat bland foods and gradually reintroduce healthy, nutrient-rich foods as tolerated, or as told by your health care provider.  Avoid high-fiber, fried, greasy, or spicy foods. This information is not intended to replace advice given to you by your health care provider. Make sure you discuss any questions you have with your health care provider. Document Released: 06/01/2003 Document Revised: 03/08/2016 Document Reviewed:  03/08/2016 Elsevier Interactive Patient Education  2017 [employer]  

## 2016-11-05 NOTE — Telephone Encounter (Signed)
Patient is calling because he now has some nausea.  Per Dr [name] patient can have a prescription for Zofran.  If gi symptoms increase like vomiting or abdominal pain, then patient should go to the ER.  Patient verbally agreed.

## 2016-11-05 NOTE — Progress Notes (Signed)
Subjective:     [name], male   DOB: [birth year], 83 y.o.   MRN: [id number]  HPI Patient seen with onset this past Sunday of watery diarrhea. He had similar episode back in June which resolved after a few days. No recent travels. No recent antibiotics. No history of C. difficile. No history of colitis. No fevers or chills. No history of lactose intolerance or gluten sensitivity. He had recent TSH which was normal. Appetite is fair. Denies any nausea or vomiting. No bloody stools. Tried over-the-counter Imodium without much relief. Sometimes having stools as frequent as every hour  Hx of diverticulosis but no recent bleed.  Past Medical History:  Diagnosis Date  . ANGULAR CHEILITIS 04/11/2009   Qualifier: Diagnosis of  By: Joyce Gross    . APHTHOUS ULCERS 12/19/2009   Qualifier: Diagnosis of  By: [name] MD, Bruce    . BENIGN PROSTATIC HYPERTROPHY, HX OF 08/21/2007   Qualifier: Diagnosis of  By: Jerral Ralph    . CERUMEN IMPACTION 04/11/2009   Qualifier: Diagnosis of  By: Joyce Gross    . COLITIS, HX OF 08/21/2007   Qualifier: Diagnosis of  By: Jerral Ralph    . COLONIC POLYPS, HX OF 12/02/2008   Qualifier: Diagnosis of  By: [name] MD, Bruce    . DIVERTICULOSIS, COLON 08/28/2006   Qualifier: Diagnosis of  By: Jerral Ralph    . GERD 12/02/2008   Qualifier: Diagnosis of  By: [name] MD, Bruce    . HYPERLIPIDEMIA 04/11/2009   Qualifier: Diagnosis of  By: Joyce Gross    . INTERNAL HEMORRHOIDS 08/28/2006   Qualifier: Diagnosis of  By: Jerral Ralph    . Nocturia   . OSTEOPOROSIS 08/21/2007   Qualifier: Diagnosis of  By: Jerral Ralph    . Shortness of breath dyspnea    with exertion  . SLEEP APNEA 08/21/2007   Qualifier: Diagnosis of  By: Jerral Ralph     Past Surgical History:  Procedure Laterality Date  . CARDIAC CATHETERIZATION N/A 07/25/2015   Procedure: Right/Left Heart Cath and Coronary Angiography;   Surgeon: Sherren Mocha, MD;  Location: [facility];  Service: Cardiovascular;  Laterality: N/A;  . COLONOSCOPY    . [place]  2001  . SKIN CANCER EXCISION     left leg  . TEE WITHOUT CARDIOVERSION N/A 08/08/2015   Procedure: TRANSESOPHAGEAL ECHOCARDIOGRAM (TEE);  Surgeon: Sherren Mocha, MD;  Location: [place];  Service: Open Heart Surgery;  Laterality: N/A;  . TRANSCATHETER AORTIC VALVE REPLACEMENT, TRANSFEMORAL N/A 08/08/2015   Procedure: TRANSCATHETER AORTIC VALVE REPLACEMENT, TRANSFEMORAL;  Surgeon: Sherren Mocha, MD;  Location: [facility];  Service: Open Heart Surgery;  Laterality: N/A;    reports that he has never smoked. He has never used smokeless tobacco. He reports that he does not drink alcohol or use drugs. family history includes Asthma in his mother; Heart disease in his other. Allergies  Allergen Reactions  . Neosporin [Neomycin-Bacitracin Zn-Polymyx] Rash     Review of Systems  Constitutional: Negative for chills and fever.  Gastrointestinal: Positive for diarrhea. Negative for abdominal pain, nausea and vomiting.       Objective:   Physical Exam  Constitutional: He appears well-developed and well-nourished.  Cardiovascular: Normal rate and regular rhythm.   Pulmonary/Chest: Effort normal and breath sounds normal. No respiratory distress. He has no wheezes. He has no rales.  Abdominal: Soft. He exhibits no mass. There is no tenderness. There is no rebound and no guarding.  Assessment:     Diarrhea. Patient does not have any recent antibiotic use or travels. Does not appear clinically dehydrated. ?viral.    Plan:     -Cautious use of Lomotil one every 6 hours as needed for severe diarrhea. -Stay well-hydrated -Touch base if diarrhea not improving over the next couple days -Consider GI consult if he continues to have intermittent diarrhea  [name] MD [facility] at [facility]

## 2016-11-13 ENCOUNTER — Ambulatory Visit: Payer: Medicare Other | Admitting: Surgery

## 2016-11-13 ENCOUNTER — Ambulatory Visit
Admission: RE | Admit: 2016-11-13 | Discharge: 2016-11-13 | Disposition: A | Discharge status: Home / Self Care | Payer: Medicare Other | Source: Ambulatory Visit | Attending: Surgery | Admitting: Surgery

## 2016-11-13 DIAGNOSIS — R918 Other nonspecific abnormal finding of lung field: Secondary | ICD-10-CM

## 2016-11-13 DIAGNOSIS — R911 Solitary pulmonary nodule: Secondary | ICD-10-CM | POA: Diagnosis not present

## 2016-11-21 DIAGNOSIS — Z9989 Dependence on other enabling machines and devices: Secondary | ICD-10-CM | POA: Diagnosis not present

## 2016-11-21 DIAGNOSIS — N401 Enlarged prostate with lower urinary tract symptoms: Secondary | ICD-10-CM | POA: Diagnosis not present

## 2016-11-21 DIAGNOSIS — R351 Nocturia: Secondary | ICD-10-CM | POA: Diagnosis not present

## 2016-11-21 DIAGNOSIS — G4733 Obstructive sleep apnea (adult) (pediatric): Secondary | ICD-10-CM | POA: Diagnosis not present

## 2016-11-21 DIAGNOSIS — K4090 Unilateral inguinal hernia, without obstruction or gangrene, not specified as recurrent: Secondary | ICD-10-CM | POA: Diagnosis not present

## 2016-11-22 NOTE — Telephone Encounter (Signed)
This encounter was created in error - please disregard.

## 2016-12-04 ENCOUNTER — Ambulatory Visit (INDEPENDENT_AMBULATORY_CARE_PROVIDER_SITE_OTHER): Payer: Medicare Other | Admitting: Surgery

## 2016-12-04 ENCOUNTER — Encounter: Payer: Self-pay | Admitting: Surgery

## 2016-12-04 ENCOUNTER — Telehealth: Payer: Self-pay

## 2016-12-04 VITALS — BP 135/58 | HR 63 | Resp 16 | Ht 63.0 in | Wt 134.0 lb

## 2016-12-04 DIAGNOSIS — R918 Other nonspecific abnormal finding of lung field: Secondary | ICD-10-CM

## 2016-12-04 DIAGNOSIS — Z953 Presence of xenogenic heart valve: Secondary | ICD-10-CM

## 2016-12-07 ENCOUNTER — Encounter: Payer: Self-pay | Admitting: Surgery

## 2016-12-07 NOTE — Progress Notes (Signed)
HPI:  The patient returns today to follow up on two left lung nodules found incidentally on his chest CT during his TAVR work-up. He underwent TAVR on 08/08/2015 and has done well with much improvement in his breathing and stamina. He is very active and walking without chest pain or dyspnea. He denies cough, sputum production or hemoptysis. When I last saw him on 05/15/2016 the lung nodules in both upper lobes were stable.  Current Outpatient Prescriptions  Medication Sig Dispense Refill  . aspirin 81 MG tablet Take 81 mg by mouth daily.    . Calcium Citrate 250 MG TABS Take 500 mg by mouth 2 (two) times daily.    . Cholecalciferol (VITAMIN D3) 5000 units TABS Take 5,000 Units by mouth daily.    Marland Kitchen doxazosin (CARDURA) 8 MG tablet Take 1 tablet (8 mg total) by mouth at bedtime. 30 tablet 5  . finasteride (PROSCAR) 5 MG tablet Take 5 mg by mouth daily.    Marland Kitchen glucosamine-chondroitin 500-400 MG tablet Take 2 tablets by mouth 2 (two) times daily.    . Multiple Vitamin (MULTIVITAMIN) tablet Take 1 tablet by mouth daily.    . NON FORMULARY CPAP machine    . Omega-3 Fatty Acids (FISH OIL) 1000 MG CAPS Take 1,000 mg by mouth daily.    . pantoprazole (PROTONIX) 40 MG tablet TAKE 1 TABLET (40 MG TOTAL) BY MOUTH DAILY. 90 tablet 2  . Probiotic Product (PRO-BIOTIC BLEND) CAPS Take 1 capsule by mouth daily as needed (digestive issues).     . tamsulosin (FLOMAX) 0.4 MG CAPS capsule Take 0.4 mg by mouth daily.      No current facility-administered medications for this visit.      Physical Exam: BP (!) 135/58 (BP Location: Right Arm, Patient Position: Sitting, Cuff Size: Large)   Pulse 63   Resp 16   Ht 5\' 3"  (1.6 m)   Wt 134 lb (60.8 kg)   SpO2 97% Comment: ON RA  BMI 23.74 kg/m  He looks well Lungs are clear Cardiac exam shows a regular rate and rhythm with a 1/6 systolic murmur along the RSB.  Diagnostic Tests:  CLINICAL DATA:  Followup pulmonary nodule  EXAM: CT CHEST WITHOUT  CONTRAST  TECHNIQUE: Multidetector CT imaging of the chest was performed following the standard protocol without IV contrast.  COMPARISON:  05/15/2016  FINDINGS: Cardiovascular: The heart size is normal. Aortic atherosclerosis. Calcification within the LAD coronary artery noted.  Mediastinum/Nodes: No enlarged mediastinal or axillary lymph nodes. Calcified right hilar lymph node again noted. Thyroid gland, trachea, and esophagus demonstrate no significant findings.  Lungs/Pleura: Trace right pleural effusion. Scattered ill defined nodular densities are again noted. Index nodule within the left upper lobe is unchanged measuring 1 cm, image 33 of series 4. No new or enlarging pulmonary nodules identified. Curvilinear densities within the lower lobes, image 51 of series 4 and image 102 of series 4, are stable from previous exam and likely reflect areas of peripheral mucoid impaction.  Upper Abdomen: No acute abnormality.  Musculoskeletal: Degenerative disc disease noted within the thoracic spine. No aggressive lytic or sclerotic bone lesions.  IMPRESSION: 1. No change in previously visualized indistinct pulmonary nodules in bilateral lungs for which approximately 15 weeks of stability has been established. 2. No change and curving linear lung opacities favoring peripheral mucoid impaction. 3. Aortic Atherosclerosis (ICD10-I70.0). One vessel coronary artery calcifications noted.   Electronically Signed   By: Kerby Moors M.D.   On: 11/13/2016  13:01   Impression:  The bilateral lung nodules are stable and have been for the past 15 months. I suspect that these are benign. I reviewed the CT images with him and his wife and answered their questions. I will see him back in 6 months with a repeat CT and if there is no change then I don't think there is any need to continue following these.  Plan:  Follow up with me in six months with a CT scan of the chest.  I  spent 15 minutes performing this established patient evaluation and > 50% of this time was spent face to face counseling and coordinating the care of this patient's bilateral lung nodules.    Gaye Pollack, MD [facility] [phone]

## 2016-12-19 NOTE — Progress Notes (Signed)
Please place orders in EPIC as patient has a pre-op appointment on 12/27/2016! Thank you! 

## 2016-12-24 NOTE — Progress Notes (Signed)
Please place orders in EPIC as patient has a pre-op appointment on 12/27/2016! Thank you! 

## 2016-12-26 ENCOUNTER — Encounter (HOSPITAL_COMMUNITY): Payer: Self-pay

## 2016-12-26 NOTE — Patient Instructions (Signed)
James Hardy  12/26/2016   Your procedure is scheduled on: 01/01/17  Report to [facility] Main  Entrance Take [place]  elevators to 3rd floor to  [place] at     [phone].    Call this number if you have problems the morning of surgery [phone]    Remember: ONLY 1 PERSON MAY GO WITH YOU TO SHORT STAY TO GET  READY MORNING OF YOUR SURGERY.  Do not eat food or drink liquids :After Midnight.     Take these medicines the morning of surgery with A SIP OF WATER: tamulosin, protonix, finasteride, doxazosin                                You may not have any metal on your body including hair pins and              piercings  Do not wear jewelry,, lotions, powders or perfumes, deodorant          .              Men may shave face and neck.   Do not bring valuables to the hospital. [place].  Contacts, dentures or bridgework may not be worn into surgery.  Leave suitcase in the car. After surgery it may be brought to your room.               Please read over the following fact sheets you were given: _____________________________________________________________________           [facility] - Preparing for Surgery Before surgery, you can play an important role.  Because skin is not sterile, your skin needs to be as free of germs as possible.  You can reduce the number of germs on your skin by washing with CHG (chlorahexidine gluconate) soap before surgery.  CHG is an antiseptic cleaner which kills germs and bonds with the skin to continue killing germs even after washing. Please DO NOT use if you have an allergy to CHG or antibacterial soaps.  If your skin becomes reddened/irritated stop using the CHG and inform your nurse when you arrive at Short Stay. Do not shave (including legs and underarms) for at least 48 hours prior to the first CHG shower.  You may shave your face/neck. Please follow these instructions  carefully:  1.  Shower with CHG Soap the night before surgery and the  morning of Surgery.  2.  If you choose to wash your hair, wash your hair first as usual with your  normal  shampoo.  3.  After you shampoo, rinse your hair and body thoroughly to remove the  shampoo.                           4.  Use CHG as you would any other liquid soap.  You can apply chg directly  to the skin and wash                       Gently with a scrungie or clean washcloth.  5.  Apply the CHG Soap to your body ONLY FROM THE NECK DOWN.   Do not use on  face/ open                           Wound or open sores. Avoid contact with eyes, ears mouth and genitals (private parts).                       Wash face,  Genitals (private parts) with your normal soap.             6.  Wash thoroughly, paying special attention to the area where your surgery  will be performed.  7.  Thoroughly rinse your body with warm water from the neck down.  8.  DO NOT shower/wash with your normal soap after using and rinsing off  the CHG Soap.                9.  Pat yourself dry with a clean towel.            10.  Wear clean pajamas.            11.  Place clean sheets on your bed the night of your first shower and do not  sleep with pets. Day of Surgery : Do not apply any lotions/deodorants the morning of surgery.  Please wear clean clothes to the hospital/surgery center.  FAILURE TO FOLLOW THESE INSTRUCTIONS MAY RESULT IN THE CANCELLATION OF YOUR SURGERY PATIENT SIGNATURE_________________________________  NURSE SIGNATURE__________________________________  ________________________________________________________________________

## 2016-12-26 NOTE — Progress Notes (Signed)
Dr  Wynonia Lawman clearance 12/03/16 chart Echo 08/14/16 epic cxr 07/19/16 epic ekg 08/14/16 epic

## 2016-12-26 NOTE — Progress Notes (Signed)
Please place orders in EPIC as patient has a pre-op appointment on 12/27/2016! Thank you! 

## 2016-12-26 NOTE — Progress Notes (Signed)
Please place orders in epic pt. Has a preop apt. 12/27/16 at 2pm. Thank you !

## 2016-12-27 ENCOUNTER — Encounter (HOSPITAL_COMMUNITY): Payer: Self-pay

## 2016-12-27 ENCOUNTER — Encounter (HOSPITAL_COMMUNITY)
Admission: RE | Admit: 2016-12-27 | Discharge: 2016-12-27 | Disposition: A | Discharge status: Home / Self Care | Payer: Medicare Other | Source: Ambulatory Visit | Attending: General Surgery | Admitting: General Surgery

## 2016-12-27 DIAGNOSIS — G4730 Sleep apnea, unspecified: Secondary | ICD-10-CM | POA: Insufficient documentation

## 2016-12-27 DIAGNOSIS — K4090 Unilateral inguinal hernia, without obstruction or gangrene, not specified as recurrent: Secondary | ICD-10-CM | POA: Insufficient documentation

## 2016-12-27 DIAGNOSIS — Z7982 Long term (current) use of aspirin: Secondary | ICD-10-CM | POA: Diagnosis not present

## 2016-12-27 DIAGNOSIS — Z01812 Encounter for preprocedural laboratory examination: Secondary | ICD-10-CM | POA: Diagnosis not present

## 2016-12-27 DIAGNOSIS — K649 Unspecified hemorrhoids: Secondary | ICD-10-CM | POA: Diagnosis not present

## 2016-12-27 DIAGNOSIS — Z952 Presence of prosthetic heart valve: Secondary | ICD-10-CM | POA: Diagnosis not present

## 2016-12-27 DIAGNOSIS — Z9889 Other specified postprocedural states: Secondary | ICD-10-CM | POA: Diagnosis not present

## 2016-12-27 DIAGNOSIS — Z7901 Long term (current) use of anticoagulants: Secondary | ICD-10-CM | POA: Insufficient documentation

## 2016-12-27 DIAGNOSIS — M549 Dorsalgia, unspecified: Secondary | ICD-10-CM | POA: Insufficient documentation

## 2016-12-27 DIAGNOSIS — N400 Enlarged prostate without lower urinary tract symptoms: Secondary | ICD-10-CM | POA: Diagnosis not present

## 2016-12-27 DIAGNOSIS — Z79899 Other long term (current) drug therapy: Secondary | ICD-10-CM | POA: Insufficient documentation

## 2016-12-27 DIAGNOSIS — K219 Gastro-esophageal reflux disease without esophagitis: Secondary | ICD-10-CM | POA: Diagnosis not present

## 2016-12-27 HISTORY — DX: Personal history of other infectious and parasitic diseases: Z86.19

## 2016-12-27 HISTORY — DX: Malignant (primary) neoplasm, unspecified: C80.1

## 2016-12-27 LAB — BASIC METABOLIC PANEL
Anion gap: 7 (ref 5–15)
BUN: 26 mg/dL — AB (ref 6–20)
CALCIUM: 8.4 mg/dL — AB (ref 8.9–10.3)
CO2: 23 mmol/L (ref 22–32)
CREATININE: 1.28 mg/dL — AB (ref 0.61–1.24)
Chloride: 109 mmol/L (ref 101–111)
GFR calc Af Amer: 57 mL/min — ABNORMAL LOW (ref >60)
GFR, EST NON AFRICAN AMERICAN: 49 mL/min — AB (ref >60)
GLUCOSE: 104 mg/dL — AB (ref 65–99)
Potassium: 4.9 mmol/L (ref 3.5–5.1)
Sodium: 139 mmol/L (ref 135–145)

## 2016-12-27 LAB — CBC
HCT: 26.6 % — ABNORMAL LOW (ref 39.0–52.0)
HEMOGLOBIN: 8.7 g/dL — AB (ref 13.0–17.0)
MCH: 33.2 pg (ref 26.0–34.0)
MCHC: 32.7 g/dL (ref 30.0–36.0)
MCV: 101.5 fL — ABNORMAL HIGH (ref 78.0–100.0)
PLATELETS: 345 10*3/uL (ref 150–400)
RBC: 2.62 MIL/uL — ABNORMAL LOW (ref 4.22–5.81)
RDW: 18 % — AB (ref 11.5–15.5)
WBC: 9.6 10*3/uL (ref 4.0–10.5)

## 2016-12-27 NOTE — Progress Notes (Signed)
Spoke with Jan triage nurse for Dr. Redmond Pulling to inform Dr. Redmond Pulling of pt's Hgb of 8.7 today at preop.

## 2016-12-27 NOTE — Progress Notes (Signed)
Cbc and bmp done 12/27/16 routed to Dr. Redmond Pulling via epic

## 2016-12-31 ENCOUNTER — Ambulatory Visit: Payer: Self-pay | Admitting: General Surgery

## 2016-12-31 NOTE — H&P (Signed)
James Hardy 11/21/2016 11:26 AM Location: [facility] Patient #: [id number] DOB: 1929-02-09 Married / Language: James Hardy / Race: White Male   History of Present Illness James Hardy M. James Hedman MD; 11/21/2016 12:27 PM) The patient is a 83 year old male who presents with an inguinal hernia. He is referred by Dr James Hardy for evaluation of a left inguinal hernia. He states that he associated bulge for about 6 weeks. He was in the shower and noticed a lump. It causes occasional pain and discomfort. At times it causes some burning and stinging. This is especially is the case with activity. He denies any nausea, vomiting, diarrhea or constipation. He denies any prior abdominal surgery. He states he had an aortic valve replacement surgery more than one year ago it is no longer on a blood thinner. He denies any chest pain, chest pressure, source breath, dyspnea on exertion. He does have BPH symptoms and takes Flomax.   Problem List/Past Medical James Hardy M. James Pulling, MD; 11/21/2016 12:29 PM) LEFT INGUINAL HERNIA (K40.90)   Past Surgical History James Hardy, RMA; 11/21/2016 11:26 AM) Cataract Surgery  Left. Hemorrhoidectomy  Oral Surgery  Valve Replacement   Diagnostic Studies History James Hardy, [place]; 11/21/2016 11:26 AM) Colonoscopy  >10 years ago  Allergies James Hardy, RMA; 11/21/2016 11:26 AM) No Known Allergies 11/21/2016  Medication History James Hardy, RMA; 11/21/2016 11:29 AM) Diphenoxylate-Atropine (2.5-0.025MG  Tablet, Oral) Active. Pantoprazole Sodium (40MG  Tablet DR, Oral) Active. Finasteride (5MG  Tablet, Oral) Active. Tamsulosin HCl (0.4MG  Capsule, Oral) Active. Doxazosin Mesylate (8MG  Tablet, Oral) Active. Iron (325 (65 Fe)MG Tablet, Oral) Active. Aspirin (81MG  Tablet, Oral) Active. Calcium Citrate (250MG  Tablet, Oral) Active. Vitamin D (50000U Tablet, Oral) Active. Glucosamine Chondroitin (Oral) Active. Multivitamin (Oral)  Active. Medications Reconciled  Social History James Hardy, [place]; 11/21/2016 11:26 AM) No alcohol use  No drug use  Tobacco use  Never smoker.  Family History James Hardy, [place]; 11/21/2016 11:26 AM) First Degree Relatives  No pertinent family history   Other Problems James Hardy M. James Pulling, MD; 11/21/2016 12:29 PM) Back Pain  Gastroesophageal Reflux Disease  Hemorrhoids  Inguinal Hernia  Sleep Apnea  Enlarged Prostate     Review of Systems James Hardy RMA; 11/21/2016 11:26 AM) General Not Present- Appetite Loss, Chills, Fatigue, Fever, Night Sweats, Weight Gain and Weight Loss. Skin Present- Dryness. Not Present- Change in Wart/Mole, Hives, Jaundice, New Lesions, Non-Healing Wounds, Rash and Ulcer. HEENT Present- Wears glasses/contact lenses. Not Present- Earache, Hearing Loss, Hoarseness, Nose Bleed, Oral Ulcers, Ringing in the Ears, Seasonal Allergies, Sinus Pain, Sore Throat, Visual Disturbances and Yellow Eyes. Cardiovascular Not Present- Chest Pain, Difficulty Breathing Lying Down, Leg Cramps, Palpitations, Rapid Heart Rate, Shortness of Breath and Swelling of Extremities. Gastrointestinal Present- Indigestion. Not Present- Abdominal Pain, Bloating, Bloody Stool, Change in Bowel Habits, Chronic diarrhea, Constipation, Difficulty Swallowing, Excessive gas, Gets full quickly at meals, Hemorrhoids, Nausea, Rectal Pain and Vomiting. Musculoskeletal Present- Back Pain. Not Present- Joint Pain, Joint Stiffness, Muscle Pain, Muscle Weakness and Swelling of Extremities. Neurological Not Present- Decreased Memory, Fainting, Headaches, Numbness, Seizures, Tingling, Tremor, Trouble walking and Weakness. Psychiatric Not Present- Anxiety, Bipolar, Change in Sleep Pattern, Depression, Fearful and Frequent crying. Endocrine Not Present- Cold Intolerance, Excessive Hunger, Hair Changes, Heat Intolerance, Hot flashes and New Diabetes. Hematology Not Present- Blood Thinners, Easy  Bruising, Excessive bleeding, Gland problems, HIV and Persistent Infections.  Vitals James Hardy RMA; 11/21/2016 11:30 AM) 11/21/2016 11:30 AM Weight: 134.2 lb Height: 62in Body Surface Area: 1.61 m Body Mass Index: 24.55 kg/m  Temp.: 97.60F  Pulse: 66 (Regular)  BP: 118/52 (Sitting, Left Arm, Standard)       Physical Exam James Hardy M. James Isaac MD; 11/21/2016 12:26 PM) General Mental Status-Alert. General Appearance-Consistent with stated age. Hydration-Well hydrated. Voice-Normal.  Integumentary Note: various states of bruising on forearms   Head and Neck Head-normocephalic, atraumatic with no lesions or palpable masses. Trachea-midline. Thyroid Gland Characteristics - normal size and consistency.  Eye Eyeball - Bilateral-Extraocular movements intact. Sclera/Conjunctiva - Bilateral-No scleral icterus.  ENMT Note: ears -normal external ears mouth - lips intact   Chest and Lung Exam Chest and lung exam reveals -quiet, even and easy respiratory effort with no use of accessory muscles and on auscultation, normal breath sounds, no adventitious sounds and normal vocal resonance. Inspection Chest Wall - Normal. Back - normal.  Breast - Did not examine.  Cardiovascular Cardiovascular examination reveals -normal heart sounds, regular rate and rhythm with no murmurs and normal pedal pulses bilaterally.  Abdomen Inspection Inspection of the abdomen reveals - No Hernias. Skin - Scar - no surgical scars. Palpation/Percussion Palpation and Percussion of the abdomen reveal - Soft, Non Tender, No Rebound tenderness, No Rigidity (guarding) and No hepatosplenomegaly. Auscultation Auscultation of the abdomen reveals - Bowel sounds normal.  Male Genitourinary Note: both testicles down, no scrotal/testicular masses; obvious L groin bulge, reducible. no bulge in RT groin with valsalva   Peripheral Vascular Upper Extremity Palpation - Pulses  bilaterally normal.  Neurologic Neurologic evaluation reveals -alert and oriented x 3 with no impairment of recent or remote memory. Mental Status-Normal.  Neuropsychiatric The patient's mood and affect are described as -normal. Judgment and Insight-insight is appropriate concerning matters relevant to self.  Musculoskeletal Normal Exam - Left-Upper Extremity Strength Normal and Lower Extremity Strength Normal. Normal Exam - Right-Upper Extremity Strength Normal and Lower Extremity Strength Normal.  Lymphatic Head & Neck  General Head & Neck Lymphatics: Bilateral - Description - Normal. Axillary - Did not examine. Femoral & Inguinal - Did not examine.    Assessment & Plan James Hardy M. Makoto Sellitto MD; 11/21/2016 12:29 PM) LEFT INGUINAL HERNIA (K40.90) Impression: We discussed the etiology of inguinal hernias. We discussed the signs & symptoms of incarceration & strangulation. We discussed non-operative and operative management.  The patient has elected to proceed with [place] (pending cardiac clearance)   I described the procedure in detail. The patient was given educational material. We discussed the risks and benefits including but not limited to bleeding, infection, chronic inguinal pain, nerve entrapment, hernia recurrence, mesh complications, hematoma formation, urinary retention, injury to the testicle, numbness in the groin, blood clots, injury to the surrounding structures, and anesthesia risk. We also discussed the typical post operative recovery course, including no heavy lifting for 4-6 weeks. I explained that the likelihood of improvement of their symptoms is good. Current Plans I recommended obtaining preoperative cardiac clearance. I am concerned about the health of the patient and the ability to tolerate the operation. Therefore, we will request clearance by cardiology to better assess operative risk & see if a  reevaluation, further workup, etc is needed. Also recommendations on how medications such as for anticoagulation and blood pressure should be managed/held/restarted after surgery. Pt Education - Pamphlet Given - Laparoscopic Hernia Repair: discussed with patient and provided information. OBSTRUCTIVE SLEEP APNEA ON CPAP (G47.33) BPH ASSOCIATED WITH NOCTURIA (N40.1) Impression: cont flomax. discussed that he is at higher risk for urinary retention  Leighton Ruff. James Pulling, MD, FACS General, Bariatric, & Minimally  Invasive Surgery [facility], [place]

## 2017-01-01 ENCOUNTER — Encounter (HOSPITAL_COMMUNITY): Payer: Self-pay | Admitting: *Deleted

## 2017-01-01 ENCOUNTER — Encounter (HOSPITAL_COMMUNITY)
Admission: RE | Disposition: A | Discharge status: Home / Self Care | Payer: Self-pay | Source: Ambulatory Visit | Attending: General Surgery

## 2017-01-01 ENCOUNTER — Ambulatory Visit (HOSPITAL_COMMUNITY): Payer: Medicare Other | Admitting: Anesthesiology

## 2017-01-01 ENCOUNTER — Observation Stay (HOSPITAL_COMMUNITY)
Admission: RE | Admit: 2017-01-01 | Discharge: 2017-01-02 | Disposition: A | Discharge status: Home / Self Care | Payer: Medicare Other | Source: Ambulatory Visit | Attending: General Surgery | Admitting: General Surgery

## 2017-01-01 DIAGNOSIS — Z79899 Other long term (current) drug therapy: Secondary | ICD-10-CM | POA: Insufficient documentation

## 2017-01-01 DIAGNOSIS — I350 Nonrheumatic aortic (valve) stenosis: Secondary | ICD-10-CM | POA: Diagnosis not present

## 2017-01-01 DIAGNOSIS — E785 Hyperlipidemia, unspecified: Secondary | ICD-10-CM | POA: Diagnosis not present

## 2017-01-01 DIAGNOSIS — Z8719 Personal history of other diseases of the digestive system: Secondary | ICD-10-CM

## 2017-01-01 DIAGNOSIS — D631 Anemia in chronic kidney disease: Secondary | ICD-10-CM | POA: Insufficient documentation

## 2017-01-01 DIAGNOSIS — G4733 Obstructive sleep apnea (adult) (pediatric): Secondary | ICD-10-CM | POA: Diagnosis not present

## 2017-01-01 DIAGNOSIS — Z888 Allergy status to other drugs, medicaments and biological substances status: Secondary | ICD-10-CM | POA: Insufficient documentation

## 2017-01-01 DIAGNOSIS — K219 Gastro-esophageal reflux disease without esophagitis: Secondary | ICD-10-CM | POA: Insufficient documentation

## 2017-01-01 DIAGNOSIS — Z7982 Long term (current) use of aspirin: Secondary | ICD-10-CM | POA: Insufficient documentation

## 2017-01-01 DIAGNOSIS — D649 Anemia, unspecified: Secondary | ICD-10-CM | POA: Diagnosis not present

## 2017-01-01 DIAGNOSIS — K4090 Unilateral inguinal hernia, without obstruction or gangrene, not specified as recurrent: Principal | ICD-10-CM | POA: Insufficient documentation

## 2017-01-01 DIAGNOSIS — I129 Hypertensive chronic kidney disease with stage 1 through stage 4 chronic kidney disease, or unspecified chronic kidney disease: Secondary | ICD-10-CM | POA: Diagnosis not present

## 2017-01-01 DIAGNOSIS — N189 Chronic kidney disease, unspecified: Secondary | ICD-10-CM | POA: Insufficient documentation

## 2017-01-01 DIAGNOSIS — Z9889 Other specified postprocedural states: Secondary | ICD-10-CM

## 2017-01-01 DIAGNOSIS — M810 Age-related osteoporosis without current pathological fracture: Secondary | ICD-10-CM | POA: Insufficient documentation

## 2017-01-01 DIAGNOSIS — N400 Enlarged prostate without lower urinary tract symptoms: Secondary | ICD-10-CM | POA: Diagnosis not present

## 2017-01-01 HISTORY — PX: INGUINAL HERNIA REPAIR: SHX194

## 2017-01-01 HISTORY — PX: INSERTION OF MESH: SHX5868

## 2017-01-01 SURGERY — REPAIR, HERNIA, INGUINAL, LAPAROSCOPIC
Anesthesia: General | Laterality: Left

## 2017-01-01 MED ORDER — CHLORHEXIDINE GLUCONATE CLOTH 2 % EX PADS: 6.0000 | MEDICATED_PAD | Freq: Once | CUTANEOUS | Status: DC

## 2017-01-01 MED ORDER — LIDOCAINE HCL (CARDIAC) 20 MG/ML IV SOLN
INTRAVENOUS | Status: DC | PRN
  Administered 2017-01-01: 50 mg via INTRAVENOUS

## 2017-01-01 MED ORDER — DOXAZOSIN MESYLATE 2 MG PO TABS
8.0000 mg | ORAL_TABLET | Freq: Every day | ORAL | Status: DC
  Filled 2017-01-01: qty 4

## 2017-01-01 MED ORDER — METOCLOPRAMIDE HCL 5 MG/ML IJ SOLN: 10.0000 mg | Freq: Once | INTRAMUSCULAR | Status: DC | PRN

## 2017-01-01 MED ORDER — ROCURONIUM BROMIDE 50 MG/5ML IV SOSY
PREFILLED_SYRINGE | INTRAVENOUS | Status: AC
  Filled 2017-01-01: qty 5

## 2017-01-01 MED ORDER — BUPIVACAINE-EPINEPHRINE (PF) 0.25% -1:200000 IJ SOLN
INTRAMUSCULAR | Status: AC
  Filled 2017-01-01: qty 30

## 2017-01-01 MED ORDER — DEXAMETHASONE SODIUM PHOSPHATE 10 MG/ML IJ SOLN
INTRAMUSCULAR | Status: AC
  Filled 2017-01-01: qty 1

## 2017-01-01 MED ORDER — FENTANYL CITRATE (PF) 100 MCG/2ML IJ SOLN
25.0000 ug | INTRAMUSCULAR | Status: DC | PRN
  Administered 2017-01-01 (×2): 50 ug via INTRAVENOUS

## 2017-01-01 MED ORDER — FENTANYL CITRATE (PF) 100 MCG/2ML IJ SOLN
INTRAMUSCULAR | Status: AC
  Administered 2017-01-01: 50 ug via INTRAVENOUS
  Filled 2017-01-01: qty 4

## 2017-01-01 MED ORDER — ONDANSETRON HCL 4 MG/2ML IJ SOLN
INTRAMUSCULAR | Status: DC | PRN
Start: 2017-01-01 — End: 2017-01-01
  Administered 2017-01-01: 4 mg via INTRAVENOUS

## 2017-01-01 MED ORDER — FINASTERIDE 5 MG PO TABS
5.0000 mg | ORAL_TABLET | Freq: Every day | ORAL | Status: DC
  Administered 2017-01-01 – 2017-01-02 (×2): 5 mg via ORAL
  Filled 2017-01-01 (×2): qty 1

## 2017-01-01 MED ORDER — DIPHENHYDRAMINE HCL 50 MG/ML IJ SOLN: 12.5000 mg | Freq: Four times a day (QID) | INTRAMUSCULAR | Status: DC | PRN

## 2017-01-01 MED ORDER — BUPIVACAINE LIPOSOME 1.3 % IJ SUSP
20.0000 mL | Freq: Once | INTRAMUSCULAR | Status: AC
  Administered 2017-01-01: 20 mL
  Filled 2017-01-01: qty 20

## 2017-01-01 MED ORDER — LIDOCAINE 2% (20 MG/ML) 5 ML SYRINGE
INTRAMUSCULAR | Status: AC
  Filled 2017-01-01: qty 5

## 2017-01-01 MED ORDER — BUPIVACAINE-EPINEPHRINE 0.25% -1:200000 IJ SOLN
INTRAMUSCULAR | Status: DC | PRN
  Administered 2017-01-01: 10 mL

## 2017-01-01 MED ORDER — FENTANYL CITRATE (PF) 100 MCG/2ML IJ SOLN
INTRAMUSCULAR | Status: DC | PRN
  Administered 2017-01-01 (×2): 25 ug via INTRAVENOUS
  Administered 2017-01-01: 50 ug via INTRAVENOUS

## 2017-01-01 MED ORDER — CEFAZOLIN SODIUM-DEXTROSE 2-4 GM/100ML-% IV SOLN
INTRAVENOUS | Status: AC
  Filled 2017-01-01: qty 100

## 2017-01-01 MED ORDER — ONDANSETRON 4 MG PO TBDP: 4.0000 mg | ORAL_TABLET | Freq: Four times a day (QID) | ORAL | Status: DC | PRN

## 2017-01-01 MED ORDER — FENTANYL CITRATE (PF) 100 MCG/2ML IJ SOLN
INTRAMUSCULAR | Status: AC
  Filled 2017-01-01: qty 2

## 2017-01-01 MED ORDER — TAMSULOSIN HCL 0.4 MG PO CAPS
0.4000 mg | ORAL_CAPSULE | Freq: Every day | ORAL | Status: DC
  Administered 2017-01-01 – 2017-01-02 (×2): 0.4 mg via ORAL
  Filled 2017-01-01 (×2): qty 1

## 2017-01-01 MED ORDER — HEPARIN SODIUM (PORCINE) 5000 UNIT/ML IJ SOLN
5000.0000 [IU] | Freq: Three times a day (TID) | INTRAMUSCULAR | Status: DC
  Administered 2017-01-01 – 2017-01-02 (×2): 5000 [IU] via SUBCUTANEOUS
  Filled 2017-01-01 (×2): qty 1

## 2017-01-01 MED ORDER — MEPERIDINE HCL 50 MG/ML IJ SOLN: 6.2500 mg | INTRAMUSCULAR | Status: DC | PRN

## 2017-01-01 MED ORDER — LACTATED RINGERS IV SOLN
INTRAVENOUS | Status: DC | PRN
  Administered 2017-01-01: 08:00:00 via INTRAVENOUS

## 2017-01-01 MED ORDER — ACETAMINOPHEN 500 MG PO TABS
1000.0000 mg | ORAL_TABLET | Freq: Four times a day (QID) | ORAL | Status: DC
  Administered 2017-01-01 – 2017-01-02 (×4): 1000 mg via ORAL
  Filled 2017-01-01 (×3): qty 2

## 2017-01-01 MED ORDER — ONDANSETRON HCL 4 MG/2ML IJ SOLN
4.0000 mg | Freq: Four times a day (QID) | INTRAMUSCULAR | Status: DC | PRN
Start: 2017-01-01 — End: 2017-01-02
  Administered 2017-01-01: 4 mg via INTRAVENOUS
  Filled 2017-01-01: qty 2

## 2017-01-01 MED ORDER — GABAPENTIN 100 MG PO CAPS
100.0000 mg | ORAL_CAPSULE | ORAL | Status: AC
  Administered 2017-01-01: 100 mg via ORAL
  Filled 2017-01-01: qty 1

## 2017-01-01 MED ORDER — DIPHENHYDRAMINE HCL 12.5 MG/5ML PO ELIX: 12.5000 mg | ORAL_SOLUTION | Freq: Four times a day (QID) | ORAL | Status: DC | PRN

## 2017-01-01 MED ORDER — CEFAZOLIN SODIUM-DEXTROSE 2-4 GM/100ML-% IV SOLN
2.0000 g | INTRAVENOUS | Status: AC
  Administered 2017-01-01: 2 g via INTRAVENOUS

## 2017-01-01 MED ORDER — ROCURONIUM BROMIDE 100 MG/10ML IV SOLN
INTRAVENOUS | Status: DC | PRN
  Administered 2017-01-01: 35 mg via INTRAVENOUS

## 2017-01-01 MED ORDER — GABAPENTIN 100 MG PO CAPS
100.0000 mg | ORAL_CAPSULE | Freq: Two times a day (BID) | ORAL | Status: DC
  Administered 2017-01-01 – 2017-01-02 (×2): 100 mg via ORAL
  Filled 2017-01-01 (×2): qty 1

## 2017-01-01 MED ORDER — ACETAMINOPHEN 500 MG PO TABS
1000.0000 mg | ORAL_TABLET | ORAL | Status: AC
  Administered 2017-01-01: 1000 mg via ORAL
  Filled 2017-01-01: qty 2

## 2017-01-01 MED ORDER — PANTOPRAZOLE SODIUM 40 MG PO TBEC
40.0000 mg | DELAYED_RELEASE_TABLET | Freq: Every day | ORAL | Status: DC
  Administered 2017-01-02: 40 mg via ORAL
  Filled 2017-01-01: qty 1

## 2017-01-01 MED ORDER — PROPOFOL 10 MG/ML IV BOLUS
INTRAVENOUS | Status: DC | PRN
  Administered 2017-01-01: 100 mg via INTRAVENOUS

## 2017-01-01 MED ORDER — SUGAMMADEX SODIUM 200 MG/2ML IV SOLN
INTRAVENOUS | Status: AC
  Filled 2017-01-01: qty 2

## 2017-01-01 MED ORDER — SUGAMMADEX SODIUM 200 MG/2ML IV SOLN
INTRAVENOUS | Status: DC | PRN
  Administered 2017-01-01: 150 mg via INTRAVENOUS

## 2017-01-01 MED ORDER — DOCUSATE SODIUM 100 MG PO CAPS
100.0000 mg | ORAL_CAPSULE | Freq: Two times a day (BID) | ORAL | Status: DC
  Administered 2017-01-01 – 2017-01-02 (×2): 100 mg via ORAL
  Filled 2017-01-01 (×2): qty 1

## 2017-01-01 MED ORDER — PROPOFOL 10 MG/ML IV BOLUS
INTRAVENOUS | Status: AC
  Filled 2017-01-01: qty 20

## 2017-01-01 MED ORDER — 0.9 % SODIUM CHLORIDE (POUR BTL) OPTIME
TOPICAL | Status: DC | PRN
  Administered 2017-01-01: 1000 mL

## 2017-01-01 MED ORDER — OXYCODONE HCL 5 MG PO TABS: 5.0000 mg | ORAL_TABLET | ORAL | Status: DC | PRN

## 2017-01-01 MED ORDER — SODIUM CHLORIDE 0.9 % IV SOLN
INTRAVENOUS | Status: DC
  Administered 2017-01-01: 12:00:00 via INTRAVENOUS
  Administered 2017-01-02: 50 mL/h via INTRAVENOUS

## 2017-01-01 MED ORDER — ONDANSETRON HCL 4 MG/2ML IJ SOLN
INTRAMUSCULAR | Status: AC
  Filled 2017-01-01: qty 2

## 2017-01-01 SURGICAL SUPPLY — 45 items
ADH SKN CLS APL DERMABOND .7 (GAUZE/BANDAGES/DRESSINGS) ×1
APL SKNCLS STERI-STRIP NONHPOA (GAUZE/BANDAGES/DRESSINGS) ×1
APPLIER CLIP 5 13 M/L LIGAMAX5 (MISCELLANEOUS)
APR CLP MED LRG 5 ANG JAW (MISCELLANEOUS)
BANDAGE ADH SHEER 1  50/CT (GAUZE/BANDAGES/DRESSINGS) IMPLANT
BENZOIN TINCTURE PRP APPL 2/3 (GAUZE/BANDAGES/DRESSINGS) ×3 IMPLANT
CABLE HIGH FREQUENCY MONO STRZ (ELECTRODE) ×3 IMPLANT
CHLORAPREP W/TINT 26ML (MISCELLANEOUS) ×3 IMPLANT
CLIP APPLIE 5 13 M/L LIGAMAX5 (MISCELLANEOUS) IMPLANT
CLOSURE WOUND 1/2 X4 (GAUZE/BANDAGES/DRESSINGS) ×1
COVER SURGICAL LIGHT HANDLE (MISCELLANEOUS) ×3 IMPLANT
DECANTER SPIKE VIAL GLASS SM (MISCELLANEOUS) ×3 IMPLANT
DERMABOND ADVANCED (GAUZE/BANDAGES/DRESSINGS) ×2
DERMABOND ADVANCED .7 DNX12 (GAUZE/BANDAGES/DRESSINGS) ×1 IMPLANT
DEVICE PMI PUNCTURE CLOSURE (MISCELLANEOUS) ×2 IMPLANT
DEVICE SECURE STRAP 25 ABSORB (INSTRUMENTS) ×2 IMPLANT
DRSG TEGADERM 2-3/8X2-3/4 SM (GAUZE/BANDAGES/DRESSINGS) IMPLANT
DRSG TEGADERM 4X4.75 (GAUZE/BANDAGES/DRESSINGS) IMPLANT
ELECT PENCIL ROCKER SW 15FT (MISCELLANEOUS) IMPLANT
ELECT REM PT RETURN 15FT ADLT (MISCELLANEOUS) ×3 IMPLANT
GAUZE SPONGE 2X2 8PLY STRL LF (GAUZE/BANDAGES/DRESSINGS) IMPLANT
GLOVE BIO SURGEON STRL SZ7.5 (GLOVE) ×3 IMPLANT
GLOVE INDICATOR 8.0 STRL GRN (GLOVE) ×3 IMPLANT
GOWN STRL REUS W/TWL XL LVL3 (GOWN DISPOSABLE) ×9 IMPLANT
KIT BASIN OR (CUSTOM PROCEDURE TRAY) ×3 IMPLANT
L-HOOK LAP DISP 36CM (ELECTROSURGICAL)
LHOOK LAP DISP 36CM (ELECTROSURGICAL) IMPLANT
MESH 3DMAX 4X6 LT LRG (Mesh General) ×3 IMPLANT
NDL SPNL 22GX3.5 QUINCKE BK (NEEDLE) IMPLANT
NEEDLE SPNL 22GX3.5 QUINCKE BK (NEEDLE) ×3 IMPLANT
SCISSORS LAP 5X35 DISP (ENDOMECHANICALS) ×3 IMPLANT
SET IRRIG TUBING LAPAROSCOPIC (IRRIGATION / IRRIGATOR) IMPLANT
SLEEVE XCEL OPT CAN 5 100 (ENDOMECHANICALS) ×3 IMPLANT
SPONGE GAUZE 2X2 STER 10/PKG (GAUZE/BANDAGES/DRESSINGS)
STRIP CLOSURE SKIN 1/2X4 (GAUZE/BANDAGES/DRESSINGS) ×2 IMPLANT
SUT MNCRL AB 4-0 PS2 18 (SUTURE) ×3 IMPLANT
SUT NOVA NAB DX-16 0-1 5-0 T12 (SUTURE) IMPLANT
SUT NOVA NAB GS-21 0 18 T12 DT (SUTURE) IMPLANT
SUT VICRYL 0 UR6 27IN ABS (SUTURE) ×2 IMPLANT
TOWEL OR 17X26 10 PK STRL BLUE (TOWEL DISPOSABLE) ×3 IMPLANT
TOWEL OR NON WOVEN STRL DISP B (DISPOSABLE) ×3 IMPLANT
TRAY LAPAROSCOPIC (CUSTOM PROCEDURE TRAY) ×3 IMPLANT
TROCAR BLADELESS OPT 5 100 (ENDOMECHANICALS) ×3 IMPLANT
TROCAR XCEL BLUNT TIP 100MML (ENDOMECHANICALS) ×3 IMPLANT
TUBING INSUF HEATED (TUBING) IMPLANT

## 2017-01-01 NOTE — Op Note (Signed)
01/01/2017  Kristen Cardinal 1928-04-15   PREOPERATIVE DIAGNOSIS: left inguinal hernia.   POSTOPERATIVE DIAGNOSIS: left indirect inguinal hernia.   PROCEDURE: Laparoscopic repair of left indirect inguinal hernia with  mesh (TAPP).   SURGEON: Leighton Ruff. Redmond Pulling, MD FACS  ASSISTANT SURGEON: None.   ANESTHESIA: General plus local consisting of 20cc exparel.   ESTIMATED BLOOD LOSS: Minimal.   FINDINGS: The patient had a large left indirect inguinal hernia. No contralateral hernia..  It was repaired using a  Bard 3D max large left mesh SPECIMEN: none  INDICATIONS FOR PROCEDURE: 83 yo male presents for repair of left inguinal hernia with mesh.  The risks and benefits including but not limited to bleeding, infection, chronic inguinal pain, nerve entrapment, hernia recurrence, mesh complications, hematoma formation, urinary retention, injury to the testicles or the ovaries, numbness in the groin, blood clots, injury to the surrounding structures, and anesthesia risk was discussed with the patient.  DESCRIPTION OF PROCEDURE: After obtaining verbal consent and marking  the left groin in the holding area with the patient confirming the  operative site, the patient was then taken back to the operating room, placed  supine on the operating room table. General endotracheal anesthesia was  established. The patient had emptied their bladder prior to going back to  the operating room. Sequential compression devices were placed. The  abdomen and groin were prepped and draped in the usual standard surgical  fashion with ChloraPrep. The patient received oral Tylenol as well as IV  antibiotics prior to the incision. A surgical time-out was performed.  Local was infiltrated at the base of the umbilicus.   Next, a 1-cm vertical infraumbilical incision was made with a #11 blade. The fascia  was grasped and lifted anteriorly. Next, the fascia was incised, and  the abdominal cavity was entered. Pursestring  suture was placed around  the fascial edges using a 0 Vicryl. A 12-mm Hasson trocar was placed.  Pneumoperitoneum was smoothly established up to a patient pressure of 15  mmHg. Laparoscope was advanced. There was no evidence of a  contralateral hernia. The patient had a large defect lateral to  the inferior epigastric vessel, consistent with an left indirect  hernia. Two 5-mm trocars were placed, one on the right, one on the left  in the midclavicular line slightly above the level of the umbilicus all  under direct visualization. After local had been infiltrated, I then  made incision along the peritoneum on the left, starting 2 inches above  the anterior superior iliac spine and caring it medial  toward the median umbilical ligament in a lazy S configuration using  Endo Shears with electrocautery. The peritoneal flap was then gently  dissected downward from the anterior abdominal wall taking care not to  injure the inferior epigastric vessels. The pubic bone was identified.  The testicular vessels were identified.  Using  traction and counter traction with short graspers, I reduced the sac in  its entirety. The testicular vessels had been identified and preserved. The vas deferens was identified and preserved, and the hernia sac was stripped from those to  surrounding structures. I then went about creating a large pocket by  lifting the peritoneum of the pelvic floor. I took great care not to  injure the iliac vessels. exparel anesthetic was injected 2 finger breadths below and medial to the anterior superior iliac spine as well as along the left groin prior to placing the mesh. I then obtained a large piece of Bard 3Dmax  mesh placed it through the Hasson trocar, half of it covered medial  to the inferior epigastric vessels and half of it lateral to the  inferior epigastric vessels. The defect was well  covered with the mesh. I then secured the mesh to the abdominal wall  using an Ethicon  secure strap tack.  Tacks were placed through  the Cooper's ligament x2 , one tack on superior medial aspect of the mesh and one tack out laterally. No tacks were placed below the  shelving edge of the inguinal ligament. Pneumoperitoneum was reduced  to 8 mmHg. I then brought the peritoneal flap back up to the abdominal  wall and tacked it to the abdominal wall using 3 tacks. There was no  defect in the peritoneum, and the mesh was well covered. There was a little bit of oozing from the anterior abdominal from a side branch of the left inferior epigastric artery. I placed a 0 vicryl transfascially using a PMI suture passer for hemostasis.  I removed the  Hasson trocar and tied down the previously placed pursestring suture.  The closure was viewed laparoscopically. There was no evidence of  fascial defect. There was no air leak at the umbilicus. There was no  evidence of injury to surrounding structures. Pneumoperitoneum was  released, and the remaining trocars were removed. All skin incisions  were closed with a 4-0 Monocryl in a subcuticular fashion followed by  application of benzoin, steri-strips, and bandages. All needle, instrument, and sponge counts  were correct x2. There are no immediate complications. The patient  tolerated the procedure well. The patient was extubated and taken to the  recovery room in stable condition.

## 2017-01-01 NOTE — Progress Notes (Signed)
Pt lower abdominal dressing is saturated with BRB and pooling under the tegaderm. I paged MD who advised me to change the dressing with dry gauze and tape and he would come by to check on patient later today.

## 2017-01-01 NOTE — Anesthesia Procedure Notes (Signed)
Procedure Name: Intubation Date/Time: 01/01/2017 8:32 AM Performed by: Glory Buff Pre-anesthesia Checklist: Patient identified, Emergency Drugs available, Suction available and Patient being monitored Patient Re-evaluated:Patient Re-evaluated prior to induction Oxygen Delivery Method: Circle system utilized Preoxygenation: Pre-oxygenation with 100% oxygen Induction Type: IV induction Ventilation: Mask ventilation without difficulty Laryngoscope Size: 3 and Miller Grade View: Grade I Tube type: Oral Tube size: 7.5 mm Number of attempts: 1 Airway Equipment and Method: Stylet and Oral airway Placement Confirmation: ETT inserted through vocal cords under direct vision,  positive ETCO2 and breath sounds checked- equal and bilateral Secured at: 21 cm Tube secured with: Tape Dental Injury: Teeth and Oropharynx as per pre-operative assessment

## 2017-01-01 NOTE — H&P (Signed)
James Hardy is an 83 y.o. male.   Chief Complaint: here for surgery HPI: 83 yo male presents for planned lap repair of L inguinal hernia. He denies any changes since I saw him in the clinic in late august.   The patient is a 83 year old male who presents with an inguinal hernia. He is referred by Dr [name] for evaluation of a left inguinal hernia. He states that he associated bulge for about 6 weeks. He was in the shower and noticed a lump. It causes occasional pain and discomfort. At times it causes some burning and stinging. This is especially is the case with activity. He denies any nausea, vomiting, diarrhea or constipation. He denies any prior abdominal surgery. He states he had an aortic valve replacement surgery more than one year ago it is no longer on a blood thinner. He denies any chest pain, chest pressure, source breath, dyspnea on exertion. He does have BPH symptoms and takes Flomax.  Past Medical History:  Diagnosis Date  . ANGULAR CHEILITIS 04/11/2009   Qualifier: Diagnosis of  By: Joyce Gross    . APHTHOUS ULCERS 12/19/2009   Qualifier: Diagnosis of  By: [name] MD, Bruce    . BENIGN PROSTATIC HYPERTROPHY, HX OF 08/21/2007   Qualifier: Diagnosis of  By: Jerral Ralph    . Cancer ([place])    left leg squamous cell removed  . CERUMEN IMPACTION 04/11/2009   Qualifier: Diagnosis of  By: Joyce Gross    . COLITIS, HX OF 08/21/2007   Qualifier: Diagnosis of  By: Jerral Ralph    . COLONIC POLYPS, HX OF 12/02/2008   Qualifier: Diagnosis of  By: [name] MD, Bruce    . DIVERTICULOSIS, COLON 08/28/2006   Qualifier: Diagnosis of  By: Jerral Ralph    . GERD 12/02/2008   Qualifier: Diagnosis of  By: [name] MD, Bruce    . H/O [facility] spotted fever   . HYPERLIPIDEMIA 04/11/2009   Qualifier: Diagnosis of  By: Joyce Gross    . INTERNAL HEMORRHOIDS 08/28/2006   Qualifier: Diagnosis of  By: Jerral Ralph    . Nocturia   .  OSTEOPOROSIS 08/21/2007   Qualifier: Diagnosis of  By: Jerral Ralph    . Shortness of breath dyspnea    with exertion  . SLEEP APNEA 08/21/2007   Qualifier: Diagnosis of  By: Jerral Ralph      Past Surgical History:  Procedure Laterality Date  . CARDIAC CATHETERIZATION N/A 07/25/2015   Procedure: Right/Left Heart Cath and Coronary Angiography;  Surgeon: Sherren Mocha, MD;  Location: [place] [facility];  Service: Cardiovascular;  Laterality: N/A;  . COLONOSCOPY    . EYE SURGERY    . [place]  2001  . SKIN CANCER EXCISION     left leg  . TEE WITHOUT CARDIOVERSION N/A 08/08/2015   Procedure: TRANSESOPHAGEAL ECHOCARDIOGRAM (TEE);  Surgeon: Sherren Mocha, MD;  Location: [place];  Service: Open Heart Surgery;  Laterality: N/A;  . TRANSCATHETER AORTIC VALVE REPLACEMENT, TRANSFEMORAL N/A 08/08/2015   Procedure: TRANSCATHETER AORTIC VALVE REPLACEMENT, TRANSFEMORAL;  Surgeon: Sherren Mocha, MD;  Location: [place];  Service: Open Heart Surgery;  Laterality: N/A;    Family History  Problem Relation Age of Onset  . Asthma Mother   . Heart disease Other    Social History:  reports that he has never smoked. He has never used smokeless tobacco. He reports that he does not drink alcohol or use drugs.  Allergies:  Allergies  Allergen Reactions  .  Neosporin [Neomycin-Bacitracin Zn-Polymyx] Rash    Medications Prior to Admission  Medication Sig Dispense Refill  . aspirin 81 MG tablet Take 81 mg by mouth daily.    . Calcium Citrate 250 MG TABS Take 500 mg by mouth daily.     . Cholecalciferol (VITAMIN D3) 5000 units TABS Take 5,000 Units by mouth daily.    . Coenzyme Q10 (CO Q 10) 100 MG CAPS Take 100 mg by mouth daily.    Marland Kitchen doxazosin (CARDURA) 8 MG tablet Take 1 tablet (8 mg total) by mouth at bedtime. 30 tablet 5  . ferrous sulfate 325 (65 FE) MG tablet Take 325 mg by mouth daily with breakfast.    . finasteride (PROSCAR) 5 MG tablet Take 5 mg by mouth daily.    Marland Kitchen  glucosamine-chondroitin 500-400 MG tablet Take 2 tablets by mouth daily.     Marland Kitchen MAGNESIUM-ZINC PO Take 1 tablet by mouth daily.    . Multiple Vitamin (MULTIVITAMIN) tablet Take 1 tablet by mouth daily.    . NON FORMULARY CPAP machine at bedtime    . Omega-3 Fatty Acids (FISH OIL) 1200 MG CAPS Take 1,200 mg by mouth daily.     . pantoprazole (PROTONIX) 40 MG tablet TAKE 1 TABLET (40 MG TOTAL) BY MOUTH DAILY. 90 tablet 2  . Probiotic Product (PRO-BIOTIC BLEND) CAPS Take 1 capsule by mouth daily.     . tamsulosin (FLOMAX) 0.4 MG CAPS capsule Take 0.4 mg by mouth daily.       No results found for this or any previous visit (from the past 48 hour(s)). No results found.  Review of Systems  Constitutional: Negative for weight loss.  HENT: Negative for nosebleeds.   Eyes: Negative for blurred vision.  Respiratory: Negative for shortness of breath.   Cardiovascular: Negative for chest pain, palpitations, orthopnea and PND.       Denies DOE  Gastrointestinal: Negative for abdominal pain, nausea and vomiting.  Genitourinary: Negative for dysuria and hematuria.  Musculoskeletal: Negative.   Skin: Negative for itching and rash.  Neurological: Negative for dizziness, focal weakness, seizures, loss of consciousness and headaches.       Denies TIAs, amaurosis fugax  Endo/Heme/Allergies: Does not bruise/bleed easily.  Psychiatric/Behavioral: The patient is not nervous/anxious.     Blood pressure (!) 154/53, pulse 64, temperature 98.2 F (36.8 C), temperature source Oral, resp. rate 16, height 5\' 2"  (1.575 m), weight 61.7 kg (136 lb), SpO2 99 %. Physical Exam  Vitals reviewed. Constitutional: He is oriented to person, place, and time. He appears well-developed and well-nourished. No distress.  HENT:  Head: Normocephalic and atraumatic.  Right Ear: External ear normal.  Left Ear: External ear normal.  Eyes: Conjunctivae are normal. No scleral icterus.  Neck: Normal range of motion. Neck supple.  No tracheal deviation present.  Cardiovascular: Normal rate and normal heart sounds.   Respiratory: Effort normal and breath sounds normal. No stridor. No respiratory distress. He has no wheezes.  GI: Soft. He exhibits no distension. There is no tenderness. There is no rebound.  +left inguinal hernia; reducible  Musculoskeletal: He exhibits no edema or tenderness.  Neurological: He is alert and oriented to person, place, and time. He exhibits normal muscle tone.  Skin: Skin is warm and dry. No rash noted. He is not diaphoretic. No erythema. No pallor.  Psychiatric: He has a normal mood and affect. His behavior is normal. Judgment and thought content normal.     Assessment/Plan Left inguinal hernia HPL GERD  Anemia of chronic disease Mild CKD H/o AV replacement  All questions asked and answered.  ERAS medications.   Leighton Ruff. Redmond Pulling, MD, [place], Bariatric, & Minimally Invasive Surgery [facility], [place]   Gayland Curry, MD 01/01/2017, 7:57 AM

## 2017-01-01 NOTE — Progress Notes (Signed)
Pt is bleeding from lower abdominal wound dressing has been changed and saturated three times since 1400. MD was notified and directed nurse to put finger over the wound and hold there for thirty minutes. Md will come to assess wound site after that thirty minutes.

## 2017-01-01 NOTE — Anesthesia Preprocedure Evaluation (Addendum)
Anesthesia Evaluation  Patient identified by MRN, date of birth, ID band Patient awake    Reviewed: Allergy & Precautions, NPO status , Patient's Chart, lab work & pertinent test results  Airway Mallampati: II  TM Distance: >3 FB Neck ROM: Full    Dental no notable dental hx.    Pulmonary sleep apnea ,    Pulmonary exam normal breath sounds clear to auscultation       Cardiovascular Normal cardiovascular exam+ Valvular Problems/Murmurs (s/p TAVR 2017)  Rhythm:Regular Rate:Normal     Neuro/Psych negative neurological ROS  negative psych ROS   GI/Hepatic negative GI ROS, Neg liver ROS,   Endo/Other  negative endocrine ROS  Renal/GU negative Renal ROS  negative genitourinary   Musculoskeletal negative musculoskeletal ROS (+)   Abdominal   Peds negative pediatric ROS (+)  Hematology negative hematology ROS (+)   Anesthesia Other Findings   Reproductive/Obstetrics negative OB ROS                             Anesthesia Physical Anesthesia Plan  ASA: II  Anesthesia Plan: General   Post-op Pain Management:    Induction: Intravenous  PONV Risk Score and Plan: 2 and Ondansetron and Treatment may vary due to age or medical condition  Airway Management Planned: Oral ETT  Additional Equipment:   Intra-op Plan:   Post-operative Plan: Extubation in OR  Informed Consent: I have reviewed the patients History and Physical, chart, labs and discussed the procedure including the risks, benefits and alternatives for the proposed anesthesia with the patient or authorized representative who has indicated his/her understanding and acceptance.   Dental advisory given  Plan Discussed with: CRNA  Anesthesia Plan Comments:         Anesthesia Quick Evaluation

## 2017-01-01 NOTE — Transfer of Care (Signed)
Immediate Anesthesia Transfer of Care Note  Patient: James Hardy  Procedure(s) Performed: LAPAROSCOPIC REPAIR LEFT INGUINAL HERNIA WITH MESH (Left ) INSERTION OF MESH (Left )  Patient Location: PACU  Anesthesia Type:General  Level of Consciousness: awake, alert  and oriented  Airway & Oxygen Therapy: Patient Spontanous Breathing and Patient connected to face mask oxygen  Post-op Assessment: Report given to RN and Post -op Vital signs reviewed and stable  Post vital signs: Reviewed and stable  Last Vitals:  Vitals:   01/01/17 0648  BP: (!) 154/53  Pulse: 64  Resp: 16  Temp: 36.8 C  SpO2: 99%    Last Pain:  Vitals:   01/01/17 0648  TempSrc: Oral      Patients Stated Pain Goal: 3 (41/63/84 5364)  Complications: No apparent anesthesia complications

## 2017-01-01 NOTE — Progress Notes (Signed)
Pt. Using home mask

## 2017-01-01 NOTE — Anesthesia Postprocedure Evaluation (Signed)
Anesthesia Post Note  Patient: James Hardy  Procedure(s) Performed: LAPAROSCOPIC REPAIR LEFT INGUINAL HERNIA WITH MESH (Left ) INSERTION OF MESH (Left )     Patient location during evaluation: PACU Anesthesia Type: General Level of consciousness: awake and alert Pain management: pain level controlled Vital Signs Assessment: post-procedure vital signs reviewed and stable Respiratory status: spontaneous breathing, nonlabored ventilation, respiratory function stable and patient connected to nasal cannula oxygen Cardiovascular status: blood pressure returned to baseline and stable Postop Assessment: no apparent nausea or vomiting Anesthetic complications: no    Last Vitals:  Vitals:   01/01/17 1236 01/01/17 1300  BP: (!) 151/64 (!) 163/66  Pulse: 61 72  Resp: 16 16  Temp:    SpO2: 100% 100%    Last Pain:  Vitals:   01/01/17 1328  TempSrc:   PainSc: 2                  [name]

## 2017-01-02 DIAGNOSIS — K4090 Unilateral inguinal hernia, without obstruction or gangrene, not specified as recurrent: Secondary | ICD-10-CM | POA: Diagnosis not present

## 2017-01-02 DIAGNOSIS — G4733 Obstructive sleep apnea (adult) (pediatric): Secondary | ICD-10-CM | POA: Diagnosis not present

## 2017-01-02 DIAGNOSIS — E785 Hyperlipidemia, unspecified: Secondary | ICD-10-CM | POA: Diagnosis not present

## 2017-01-02 DIAGNOSIS — K219 Gastro-esophageal reflux disease without esophagitis: Secondary | ICD-10-CM | POA: Diagnosis not present

## 2017-01-02 DIAGNOSIS — N400 Enlarged prostate without lower urinary tract symptoms: Secondary | ICD-10-CM | POA: Diagnosis not present

## 2017-01-02 DIAGNOSIS — Z888 Allergy status to other drugs, medicaments and biological substances status: Secondary | ICD-10-CM | POA: Diagnosis not present

## 2017-01-02 MED ORDER — OXYCODONE HCL 5 MG PO TABS: 5.0000 mg | ORAL_TABLET | Freq: Four times a day (QID) | ORAL | 0 refills | Status: DC | PRN

## 2017-01-02 NOTE — Discharge Instructions (Signed)
RESUME ASPIRIN ON Monday  LEAVE LARGE WHITE GAUZE ON UNTIL Saturday - CHANGE ON Saturday.   [facility]  UMBILICAL OR INGUINAL HERNIA REPAIR: POST OP INSTRUCTIONS  Always review your discharge instruction sheet given to you by the facility where your surgery was performed. IF YOU HAVE DISABILITY OR FAMILY LEAVE FORMS, YOU MUST BRING THEM TO THE OFFICE FOR PROCESSING.   DO NOT GIVE THEM TO YOUR DOCTOR.  1. A  prescription for pain medication may be given to you upon discharge.  Take your pain medication as prescribed, if needed.  If narcotic pain medicine is not needed, then you may take acetaminophen (Tylenol)  2. Take your usually prescribed medications unless otherwise directed. 3. If you need a refill on your pain medication, please contact your pharmacy.  They will contact our office to request authorization. Prescriptions will not be filled after 5 pm or on week-ends. 4. You should follow a light diet the first 24 hours after arrival home, such as soup and crackers, etc.  Be sure to include lots of fluids daily.  Resume your normal diet the day after surgery. 5. Most patients will experience some swelling and bruising around the umbilicus or in the groin and scrotum.  Ice packs and reclining will help.  Swelling and bruising can take several days to resolve.  6. It is common to experience some constipation if taking pain medication after surgery.  Increasing fluid intake and taking a stool softener (such as Colace) will usually help or prevent this problem from occurring.  A mild laxative (Milk of Magnesia or Miralax) should be taken according to package directions if there are no bowel movements after 48 hours. 7. Unless discharge instructions indicate otherwise, you may remove your bandages 48 hours after surgery, and you may shower at that time.  You  have steri-strips (small skin tapes) in place directly over the incision.  These strips should be left on the skin for  7-10 days.  8. ACTIVITIES:  You may resume regular (light) daily activities beginning the next day--such as daily self-care, walking, climbing stairs--gradually increasing activities as tolerated.  You may have sexual intercourse when it is comfortable.  Refrain from any heavy lifting or straining until approved by your doctor. a. You may drive when you are no longer taking prescription pain medication, you can comfortably wear a seatbelt, and you can safely maneuver your car and apply brakes. b. RETURN TO WORK:  9. You should see your doctor in the office for a follow-up appointment approximately 2-3 weeks after your surgery.  Make sure that you call for this appointment within a day or two after you arrive home to insure a convenient appointment time. 10. OTHER INSTRUCTIONS:     WHEN TO CALL YOUR DOCTOR: 1. Fever over 101.0 2. Inability to urinate 3. Nausea and/or vomiting 4. Extreme swelling or bruising 5. Continued bleeding from incision. 6. Increased pain, redness, or drainage from the incision  The clinic staff is available to answer your questions during regular business hours.  Please dont hesitate to call and ask to speak to one of the nurses for clinical concerns.  If you have a medical emergency, go to the nearest emergency room or call 911.  A surgeon from [facility] is always on call at the hospital   [address] [place], [place], [place]  [address] ?  [address], [place], [place]   [address] [phone] ? [phone] ? [phone] Web site: [email or web address]

## 2017-01-02 NOTE — Discharge Summary (Signed)
Physician Discharge Summary  James Hardy YQI:[id number] DOB: July 16, 1928 DOA: 01/01/2017  PCP: [name], MD  Admit date: 01/01/2017 Discharge date: 01/02/2017  Recommendations for Outpatient Follow-up:   Follow-up Information    James Pickerel, MD. Schedule an appointment as soon as possible for a visit in 3 weeks.   Specialty:  General Surgery Why:  For wound re-check Contact information: [place] [address] [phone]          Discharge Diagnoses:  1. LEFT INDIRECT HERNIA S/P REPAIR 2. HTN 3. H/O AV REPLACEMENT 4. BPH 5. GERD 6. OSA  Surgical Procedure: LAPAROSCOPIC REPAIR OF LEFT INGUINAL HERNIA WITH MESH 10/10  Discharge Condition: GOOD Disposition: HOME  Diet recommendation: CARDIAC  Filed Weights   01/01/17 0719  Weight: 61.7 kg (136 lb)    History of present illness:  The patient is a 83 year old male who presents with an inguinal hernia. He is referred by Dr [name] for evaluation of a left inguinal hernia. He states that he associated bulge for about 6 weeks. He was in the shower and noticed a lump. It causes occasional pain and discomfort. At times it causes some burning and stinging. This is especially is the case with activity. He denies any nausea, vomiting, diarrhea or constipation. He denies any prior abdominal surgery. He states he had an aortic valve replacement surgery more than one year ago it is no longer on a blood thinner. He denies any chest pain, chest pressure, source breath, dyspnea on exertion. He does have BPH symptoms and takes Flomax.  Hospital Course:  He was kept overnight for observation given his age and monitor for urinary retention. He voided well. He did have a little bleeding from one operative site which was controlled with direct pressure. It last bleed around 6:30 pm on 10/10 with no addl bleeding after that. He was tolerating a diet, ambulating with stable vitals and had minimal  pain. I discussed dc instructions with them extensively.   BP (!) 114/47 (BP Location: Right Arm)   Pulse 63   Temp 97.8 F (36.6 C) (Oral)   Resp 16   Ht 5\' 2"  (1.575 m)   Wt 61.7 kg (136 lb)   SpO2 97%   BMI 24.87 kg/m   Gen: alert, NAD, non-toxic appearing Pupils: equal, no scleral icterus Pulm: Lungs clear to auscultation, symmetric chest rise CV: regular rate and rhythm Abd: soft, min tender, nondistended. No hematoma. No cellulitis. No incisional hernia Ext: no edema, no calf tenderness Skin: no rash, no jaundice    Discharge Instructions  Discharge Instructions    Call MD for:    Complete by:  As directed    Temperature >101   Call MD for:  hives    Complete by:  As directed    Call MD for:  persistant dizziness or light-headedness    Complete by:  As directed    Call MD for:  persistant nausea and vomiting    Complete by:  As directed    Call MD for:  redness, tenderness, or signs of infection (pain, swelling, redness, odor or green/yellow discharge around incision site)    Complete by:  As directed    Call MD for:  severe uncontrolled pain    Complete by:  As directed    Diet - low sodium heart healthy    Complete by:  As directed    Discharge instructions    Complete by:  As directed  See [facility] discharge instructions   Increase activity slowly    Complete by:  As directed      Allergies as of 01/02/2017      Reactions   Neosporin [neomycin-bacitracin Zn-polymyx] Rash      Medication List    STOP taking these medications   aspirin 81 MG tablet     TAKE these medications   Calcium Citrate 250 MG Tabs Take 500 mg by mouth daily.   Co Q 10 100 MG Caps Take 100 mg by mouth daily.   doxazosin 8 MG tablet Commonly known as:  CARDURA Take 1 tablet (8 mg total) by mouth at bedtime.   ferrous sulfate 325 (65 FE) MG tablet Take 325 mg by mouth daily with breakfast.   finasteride 5 MG tablet Commonly known as:  PROSCAR Take 5 mg by mouth  daily.   Fish Oil 1200 MG Caps Take 1,200 mg by mouth daily.   glucosamine-chondroitin 500-400 MG tablet Take 2 tablets by mouth daily.   MAGNESIUM-ZINC PO Take 1 tablet by mouth daily.   multivitamin tablet Take 1 tablet by mouth daily.   NON FORMULARY CPAP machine at bedtime   oxyCODONE 5 MG immediate release tablet Commonly known as:  Oxy IR/ROXICODONE Take 1 tablet (5 mg total) by mouth every 6 (six) hours as needed for moderate pain.   pantoprazole 40 MG tablet Commonly known as:  PROTONIX TAKE 1 TABLET (40 MG TOTAL) BY MOUTH DAILY.   PRO-BIOTIC BLEND Caps Take 1 capsule by mouth daily.   tamsulosin 0.4 MG Caps capsule Commonly known as:  FLOMAX Take 0.4 mg by mouth daily.   Vitamin D3 5000 units Tabs Take 5,000 Units by mouth daily.      Follow-up Information    James Pickerel, MD. Schedule an appointment as soon as possible for a visit in 3 weeks.   Specialty:  General Surgery Why:  For wound re-check Contact information: [place] [address] [phone]            The results of significant diagnostics from this hospitalization (including imaging, microbiology, ancillary and laboratory) are listed below for reference.    Significant Diagnostic Studies: No results found.  Microbiology: No results found for this or any previous visit (from the past 240 hour(s)).   Labs: Basic Metabolic Panel:  Recent Labs Lab 12/27/16 1431  NA 139  K 4.9  CL 109  CO2 23  GLUCOSE 104*  BUN 26*  CREATININE 1.28*  CALCIUM 8.4*   Liver Function Tests: No results for input(s): AST, ALT, ALKPHOS, BILITOT, PROT, ALBUMIN in the last 168 hours. No results for input(s): LIPASE, AMYLASE in the last 168 hours. No results for input(s): AMMONIA in the last 168 hours. CBC:  Recent Labs Lab 12/27/16 1431  WBC 9.6  HGB 8.7*  HCT 26.6*  MCV 101.5*  PLT 345   Cardiac Enzymes: No results for input(s): CKTOTAL, CKMB, CKMBINDEX,  TROPONINI in the last 168 hours. BNP: BNP (last 3 results) No results for input(s): BNP in the last 8760 hours.  ProBNP (last 3 results) No results for input(s): PROBNP in the last 8760 hours.  CBG: No results for input(s): GLUCAP in the last 168 hours.  Active Problems:   S/P inguinal hernia repair   Time coordinating discharge: 15 min  Signed:  Gayland Curry, MD [facility], [place] [phone] 01/02/2017, 11:35 AM

## 2017-01-02 NOTE — Progress Notes (Signed)
Discharge instructions reviewed with patient, wife, and son. All questions answered. Patient rolled down in wheelchair with belongings to vehicle.

## 2017-01-02 NOTE — Progress Notes (Signed)
Late entry:  Dr. Marlou Starks  ok'd  to hold Cardura at 2200 01/01/2017 because his blood pressure was 100/46.

## 2017-01-07 ENCOUNTER — Encounter (HOSPITAL_COMMUNITY): Payer: Self-pay | Admitting: General Surgery

## 2017-01-07 ENCOUNTER — Other Ambulatory Visit: Payer: Self-pay | Admitting: *Deleted

## 2017-01-07 MED ORDER — PANTOPRAZOLE SODIUM 40 MG PO TBEC: 40.0000 mg | DELAYED_RELEASE_TABLET | Freq: Every day | ORAL | 2 refills | Status: DC

## 2017-01-24 ENCOUNTER — Telehealth: Payer: Self-pay | Admitting: Family Medicine

## 2017-01-24 NOTE — Telephone Encounter (Signed)
PA approved, form faxed back to pharmacy. 

## 2017-01-24 NOTE — Telephone Encounter (Signed)
Pending. Key: [id number]

## 2017-01-24 NOTE — Telephone Encounter (Signed)
Pt need PA for pantoprazole 40 MG   Pharm:  [name] in [place]

## 2017-01-28 ENCOUNTER — Other Ambulatory Visit: Payer: Self-pay | Admitting: Family Medicine

## 2017-01-28 NOTE — Telephone Encounter (Signed)
Pt is calling stating that the pharmacy has not PA and would like to see if it can be resent to them.

## 2017-01-28 NOTE — Telephone Encounter (Signed)
I called and spoke with the pharmacy. They are aware the PA is approved until 01/24/2018 and will fill rx for patient.

## 2017-02-03 ENCOUNTER — Ambulatory Visit (INDEPENDENT_AMBULATORY_CARE_PROVIDER_SITE_OTHER): Payer: Medicare Other | Admitting: Family Medicine

## 2017-02-03 VITALS — BP 130/70 | HR 69 | Temp 98.3°F | Wt 142.3 lb

## 2017-02-03 DIAGNOSIS — S5011XA Contusion of right forearm, initial encounter: Secondary | ICD-10-CM | POA: Diagnosis not present

## 2017-02-03 DIAGNOSIS — M25421 Effusion, right elbow: Secondary | ICD-10-CM

## 2017-02-03 NOTE — Progress Notes (Signed)
Subjective:     [name], male   DOB: December 18, 1928, 83 y.o.   MRN: [id number]  HPI Patient seen with right elbow swelling and some forearm swelling. First noted yesterday. This morning he first noticed some bruising involving the forearm. No injury. He does do exercise generally every morning where he puts weight on both elbows and basically this is a core stabilizing type exercise. Denies any prior history of olecranon bursitis. He takes aspirin but no anticoagulants. He really denies any significant pain. No fevers or chills.  Past Medical History:  Diagnosis Date  . ANGULAR CHEILITIS 04/11/2009   Qualifier: Diagnosis of  By: Joyce Gross    . APHTHOUS ULCERS 12/19/2009   Qualifier: Diagnosis of  By: [name] MD, Hester Joslin    . BENIGN PROSTATIC HYPERTROPHY, HX OF 08/21/2007   Qualifier: Diagnosis of  By: Jerral Ralph    . Cancer ([place])    left leg squamous cell removed  . CERUMEN IMPACTION 04/11/2009   Qualifier: Diagnosis of  By: Joyce Gross    . COLITIS, HX OF 08/21/2007   Qualifier: Diagnosis of  By: Jerral Ralph    . COLONIC POLYPS, HX OF 12/02/2008   Qualifier: Diagnosis of  By: [name] MD, Isabela Nardelli    . DIVERTICULOSIS, COLON 08/28/2006   Qualifier: Diagnosis of  By: Jerral Ralph    . GERD 12/02/2008   Qualifier: Diagnosis of  By: [name] MD, Martha Soltys    . H/O [facility] spotted fever   . HYPERLIPIDEMIA 04/11/2009   Qualifier: Diagnosis of  By: Joyce Gross    . INTERNAL HEMORRHOIDS 08/28/2006   Qualifier: Diagnosis of  By: Jerral Ralph    . Nocturia   . OSTEOPOROSIS 08/21/2007   Qualifier: Diagnosis of  By: Jerral Ralph    . Shortness of breath dyspnea    with exertion  . SLEEP APNEA 08/21/2007   Qualifier: Diagnosis of  By: Jerral Ralph     Past Surgical History:  Procedure Laterality Date  . COLONOSCOPY    . EYE SURGERY    . [place]  2001  . SKIN CANCER EXCISION     left leg    reports  that  has never smoked. he has never used smokeless tobacco. He reports that he does not drink alcohol or use drugs. family history includes Asthma in his mother; Heart disease in his other. Allergies  Allergen Reactions  . Neosporin [Neomycin-Bacitracin Zn-Polymyx] Rash     Review of Systems  Constitutional: Negative for chills and fever.  Neurological: Negative for weakness and numbness.       Objective:   Physical Exam  Constitutional: He appears well-developed and well-nourished.  Cardiovascular: Normal rate.  Pulmonary/Chest: Effort normal and breath sounds normal. No respiratory distress. He has no wheezes. He has no rales.  Musculoskeletal:  Patient has full range of motion right elbow. No localized tenderness. He has some swelling of the right olecranon bursa region but no warmth. He has fairly extensive bruising involving the flexor surface of the right forearm basically from the elbow region all the way down to the wrist. Nontender. No pain with supination or pronation of forearm       Assessment:     Right olecranon bursa inflammation with some bruising of the right forearm but no reported injury. Suspect he ruptured a superficial vessel proximal forearm from elbow region probably from exercises above    Plan:     -Elevate right upper extremity frequently -Consider icing 15-20  minutes 2-3 times daily next couple days -Follow-up immediately for signs of secondary infection of bursa such as increased warmth or erythema -He is aware the bruising may take several days refill weeks to resolve  [name] MD [facility]

## 2017-02-03 NOTE — Patient Instructions (Signed)
Elevate right arm frequently May try some icing 15-20 minutes few times daily next couple of days Avoid any pressure on elbows Follow up for any signs of infection such as increased redness, fever, or increasing swelling.

## 2017-02-17 ENCOUNTER — Ambulatory Visit (INDEPENDENT_AMBULATORY_CARE_PROVIDER_SITE_OTHER): Payer: Medicare Other | Admitting: Otolaryngology

## 2017-02-17 DIAGNOSIS — H6123 Impacted cerumen, bilateral: Secondary | ICD-10-CM | POA: Diagnosis not present

## 2017-03-11 IMAGING — CR DG CHEST 2V
2 series · 2 of 2 positions shown · non-contrast
Comparison: CT chest 07/31/2015

CLINICAL DATA: Aortic stenosis, preoperative evaluation for AVR on
08/08/2015, dyspnea, history hypertension

EXAM:
CHEST  2 VIEW

[w chest pa]
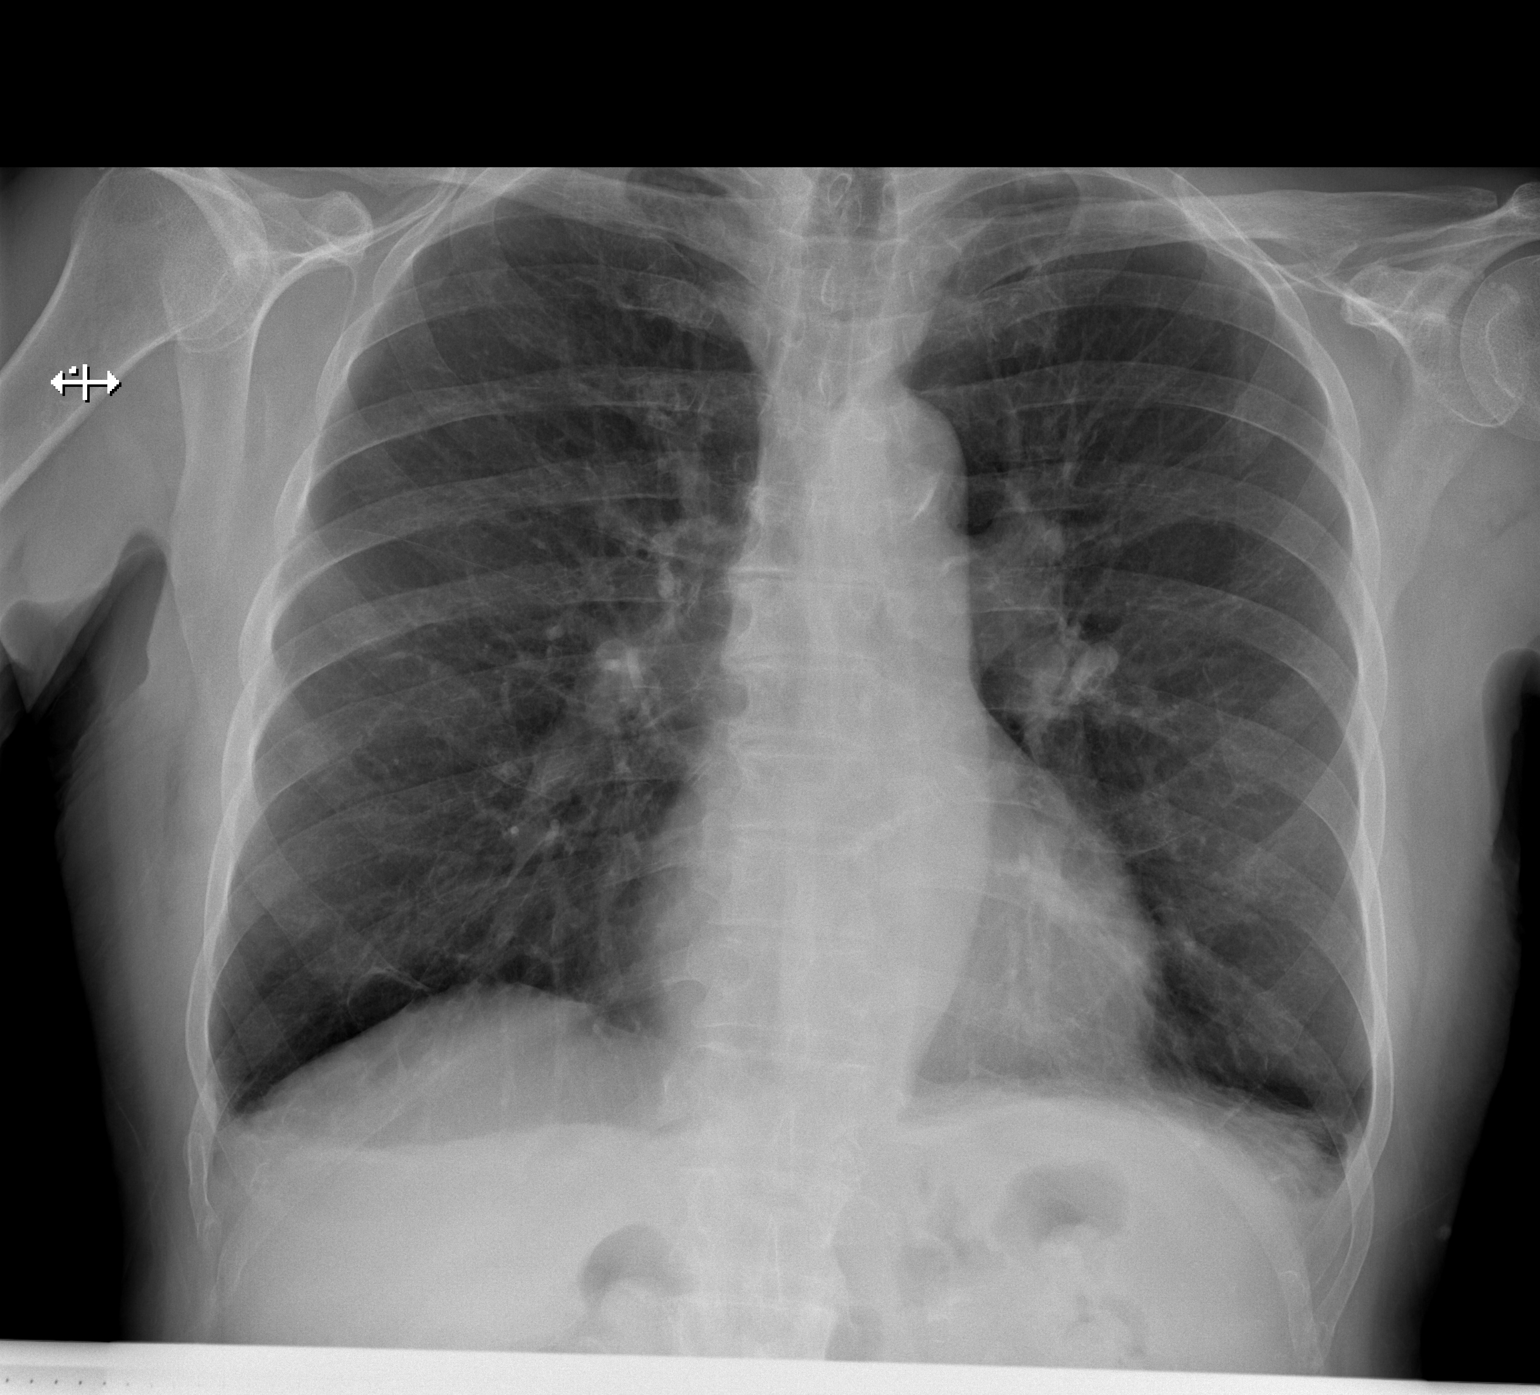

[w chest lat]
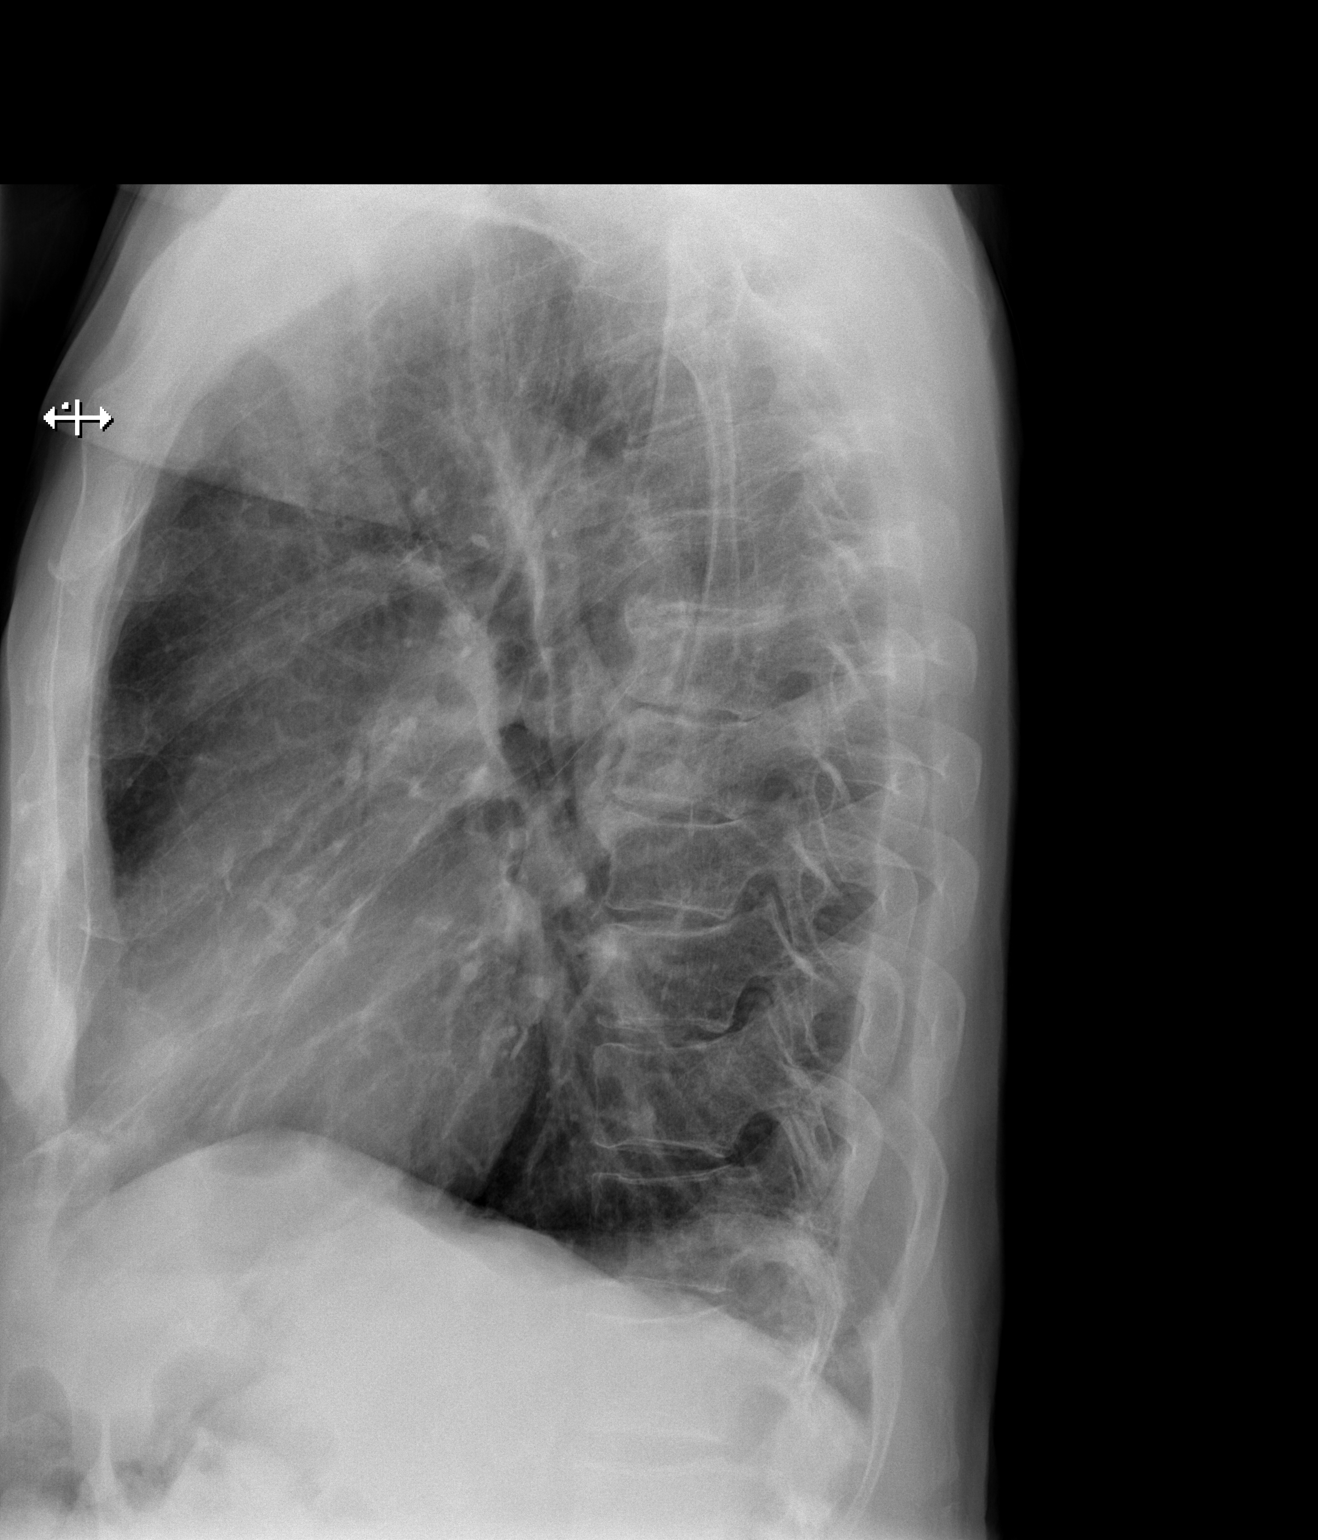

[2 of 2 positions shown; findings below may reference images not displayed]

FINDINGS: Normal heart size, mediastinal contours, and pulmonary vascularity.

Atherosclerotic calcification aorta.

Lungs appear hyperinflated but with minimal atelectasis at LEFT
base.

Remaining lungs clear.

No pulmonary infiltrate, pleural effusion or pneumothorax.

Eventration posterior LEFT diaphragm similar to prior CT.

No acute osseous findings.
IMPRESSION: Emphysematous changes with minimal LEFT basilar atelectasis.

## 2017-03-15 IMAGING — CR DG CHEST 1V PORT
1 series · 1 of 1 positions shown · non-contrast
Comparison: 08/04/2015

CLINICAL DATA: Post transcatheter aortic valve replacement

EXAM:
PORTABLE CHEST 1 VIEW

[AP]
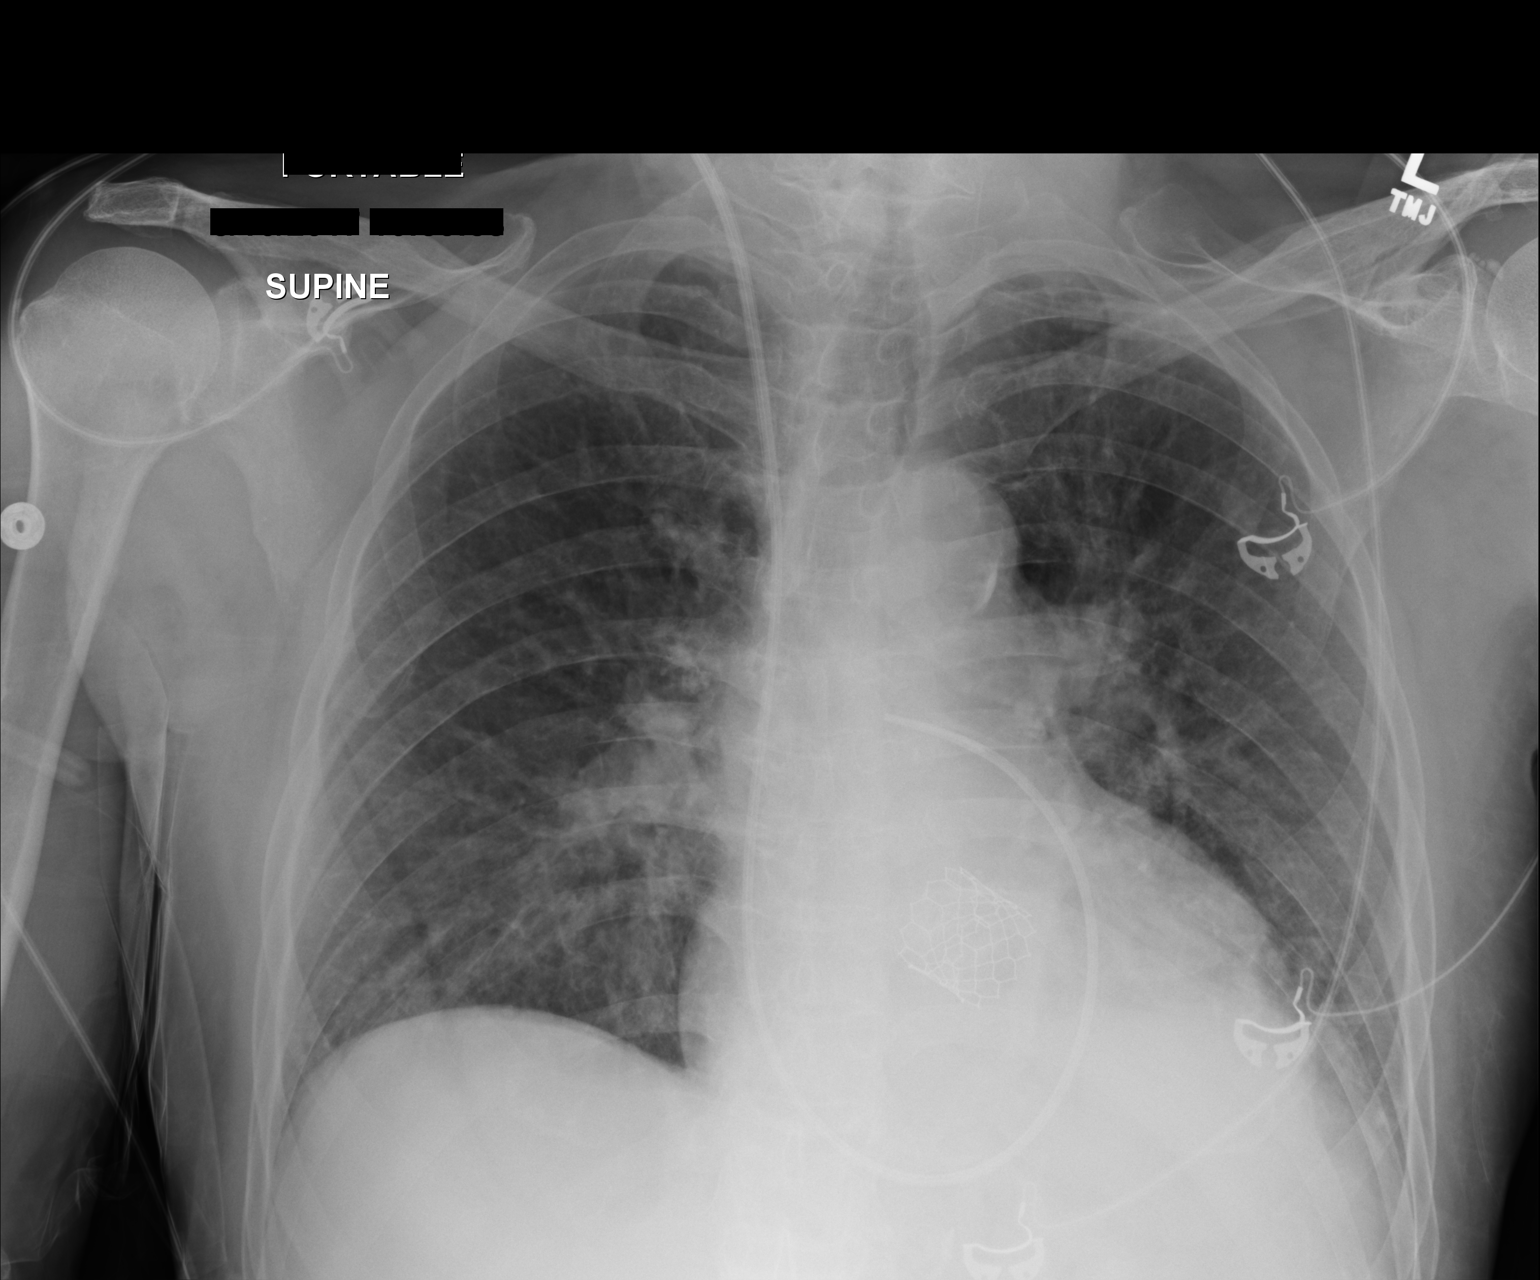

[1 of 1 positions shown; findings below may reference images not displayed]

FINDINGS: Borderline cardiomegaly noted. Transcatheter aortic valve prosthesis
is noted in the region of aortic valve. There is a right IJ
Swan-Ganz catheter with tip in the region of main pulmonary artery.
No pneumothorax. Mild left basilar atelectasis.
IMPRESSION: Transcatheter aortic valve prosthesis is noted in the region of
aortic valve. There is a right IJ Swan-Ganz catheter with tip in the
region of main pulmonary artery. No pneumothorax. Mild left basilar
atelectasis.

## 2017-03-16 IMAGING — CR DG CHEST 1V PORT
1 series · 1 of 1 positions shown · non-contrast
Comparison: Portable chest x-ray August 08, 2015

CLINICAL DATA: Status post transcatheter aortic valve replacement
for severe aortic stenosis

EXAM:
PORTABLE CHEST 1 VIEW

[AP]
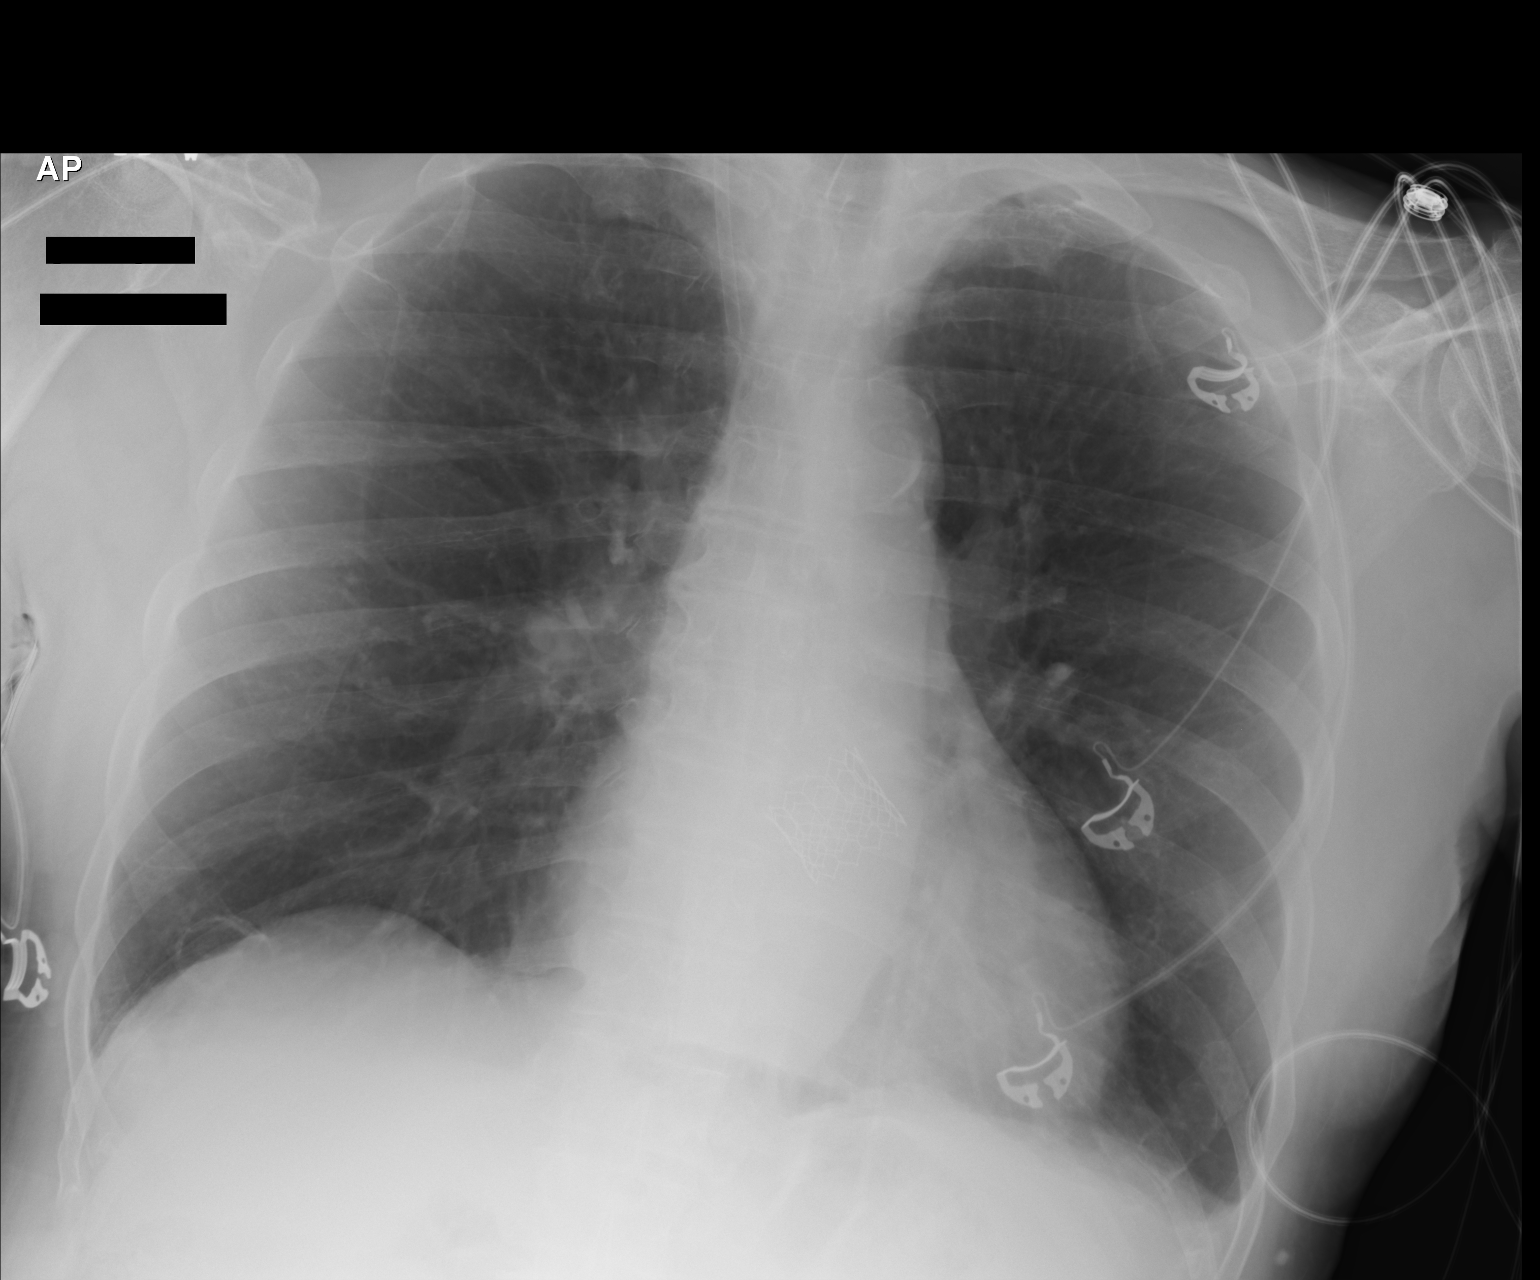

[1 of 1 positions shown; findings below may reference images not displayed]

FINDINGS: The lungs are mildly hyperinflated. There is a small left pleural
effusion. Left basilar atelectasis has resolved. The cardiac
silhouette is normal in size. The prosthetic aortic valve cage is
visible in in reasonable position radiographically. The pulmonary
vascularity is less engorged today. The right internal jugular
Cordis sheath remains but the Swan-Ganz catheter is been removed.
IMPRESSION: Improved appearance of both lungs with resolution of interstitial
edema and left lower lobe subsegmental atelectasis. A tiny left
pleural effusion persists.

## 2017-03-27 DIAGNOSIS — G4733 Obstructive sleep apnea (adult) (pediatric): Secondary | ICD-10-CM | POA: Diagnosis not present

## 2017-03-27 DIAGNOSIS — E785 Hyperlipidemia, unspecified: Secondary | ICD-10-CM | POA: Diagnosis not present

## 2017-03-27 DIAGNOSIS — I359 Nonrheumatic aortic valve disorder, unspecified: Secondary | ICD-10-CM | POA: Diagnosis not present

## 2017-03-27 DIAGNOSIS — R0602 Shortness of breath: Secondary | ICD-10-CM | POA: Diagnosis not present

## 2017-03-27 DIAGNOSIS — Z952 Presence of prosthetic heart valve: Secondary | ICD-10-CM | POA: Diagnosis not present

## 2017-03-27 DIAGNOSIS — K219 Gastro-esophageal reflux disease without esophagitis: Secondary | ICD-10-CM | POA: Diagnosis not present

## 2017-03-27 DIAGNOSIS — I2510 Atherosclerotic heart disease of native coronary artery without angina pectoris: Secondary | ICD-10-CM | POA: Diagnosis not present

## 2017-03-31 ENCOUNTER — Telehealth: Payer: Self-pay | Admitting: Family Medicine

## 2017-03-31 DIAGNOSIS — D649 Anemia, unspecified: Secondary | ICD-10-CM

## 2017-03-31 DIAGNOSIS — I359 Nonrheumatic aortic valve disorder, unspecified: Secondary | ICD-10-CM | POA: Diagnosis not present

## 2017-03-31 NOTE — Telephone Encounter (Signed)
Received a call from his cardiologist. His hemoglobin is apparently down below 8 range. He's had long-standing history of normocytic to macrocytic anemia.  He's had some recent increasing shortness of breath and cardiologist did not feel this was likely related to any, valvular or cardiac etiology. B12 levels in 2017 normal. Previous iron studies per GI were negative but were done several years ago. Will recommend hematology referral.  Discussed with patient and he is in agreement.

## 2017-04-01 ENCOUNTER — Ambulatory Visit: Payer: Medicare Other | Admitting: Family Medicine

## 2017-04-01 ENCOUNTER — Telehealth: Payer: Self-pay | Admitting: Hematology and Oncology

## 2017-04-01 NOTE — Telephone Encounter (Signed)
Patient called in to confirm appointment D/T/Loc/PH#

## 2017-04-11 DIAGNOSIS — H04123 Dry eye syndrome of bilateral lacrimal glands: Secondary | ICD-10-CM | POA: Diagnosis not present

## 2017-04-14 DIAGNOSIS — D045 Carcinoma in situ of skin of trunk: Secondary | ICD-10-CM | POA: Diagnosis not present

## 2017-04-17 DIAGNOSIS — R351 Nocturia: Secondary | ICD-10-CM | POA: Diagnosis not present

## 2017-04-17 DIAGNOSIS — N401 Enlarged prostate with lower urinary tract symptoms: Secondary | ICD-10-CM | POA: Diagnosis not present

## 2017-04-17 DIAGNOSIS — R350 Frequency of micturition: Secondary | ICD-10-CM | POA: Diagnosis not present

## 2017-04-29 ENCOUNTER — Inpatient Hospital Stay: Payer: Medicare Other | Attending: Hematology and Oncology | Admitting: Hematology and Oncology

## 2017-04-29 ENCOUNTER — Other Ambulatory Visit: Payer: Self-pay

## 2017-04-29 ENCOUNTER — Inpatient Hospital Stay: Payer: Medicare Other

## 2017-04-29 ENCOUNTER — Encounter: Payer: Medicare Other | Admitting: Hematology and Oncology

## 2017-04-29 VITALS — BP 124/44 | HR 78 | Temp 97.9°F | Resp 17 | Ht 62.0 in | Wt 143.9 lb

## 2017-04-29 DIAGNOSIS — D508 Other iron deficiency anemias: Secondary | ICD-10-CM

## 2017-04-29 DIAGNOSIS — D539 Nutritional anemia, unspecified: Secondary | ICD-10-CM | POA: Diagnosis not present

## 2017-04-29 DIAGNOSIS — Z952 Presence of prosthetic heart valve: Secondary | ICD-10-CM | POA: Diagnosis not present

## 2017-04-29 LAB — CBC WITH DIFFERENTIAL (CANCER CENTER ONLY)
Basophils Absolute: 0.1 10*3/uL (ref 0.0–0.1)
Basophils Relative: 1 %
EOS PCT: 0 %
Eosinophils Absolute: 0 10*3/uL (ref 0.0–0.5)
HCT: 20.8 % — ABNORMAL LOW (ref 38.4–49.9)
Hemoglobin: 6.9 g/dL — CL (ref 13.0–17.1)
LYMPHS ABS: 0.9 10*3/uL (ref 0.9–3.3)
LYMPHS PCT: 12 %
MCH: 34 pg — AB (ref 27.2–33.4)
MCHC: 33 g/dL (ref 32.0–36.0)
MCV: 102.9 fL — AB (ref 79.3–98.0)
MONO ABS: 1.2 10*3/uL — AB (ref 0.1–0.9)
MONOS PCT: 17 %
Neutro Abs: 4.9 10*3/uL (ref 1.5–6.5)
Neutrophils Relative %: 70 %
PLATELETS: 359 10*3/uL (ref 140–400)
RBC: 2.02 MIL/uL — ABNORMAL LOW (ref 4.20–5.82)
RDW: 19.2 % — AB (ref 11.0–14.6)
WBC Count: 7.1 10*3/uL (ref 4.0–10.3)

## 2017-04-29 LAB — CMP (CANCER CENTER ONLY)
ALK PHOS: 41 U/L (ref 40–150)
ALT: 13 U/L (ref 0–55)
ANION GAP: 6 (ref 3–11)
AST: 17 U/L (ref 5–34)
Albumin: 3.5 g/dL (ref 3.5–5.0)
BUN: 28 mg/dL — ABNORMAL HIGH (ref 7–26)
CALCIUM: 8.3 mg/dL — AB (ref 8.4–10.4)
CO2: 21 mmol/L — AB (ref 22–29)
Chloride: 112 mmol/L — ABNORMAL HIGH (ref 98–109)
Creatinine: 1.4 mg/dL — ABNORMAL HIGH (ref 0.70–1.30)
GFR, EST AFRICAN AMERICAN: 51 mL/min — AB (ref >60)
GFR, Estimated: 44 mL/min — ABNORMAL LOW (ref >60)
Glucose, Bld: 98 mg/dL (ref 70–140)
Potassium: 4.6 mmol/L (ref 3.5–5.1)
SODIUM: 139 mmol/L (ref 136–145)
TOTAL PROTEIN: 5.7 g/dL — AB (ref 6.4–8.3)
Total Bilirubin: 0.3 mg/dL (ref 0.2–1.2)

## 2017-04-29 LAB — DIRECT ANTIGLOBULIN TEST (NOT AT ARMC)
DAT, COMPLEMENT: NEGATIVE
DAT, IGG: NEGATIVE

## 2017-04-29 LAB — SAMPLE TO BLOOD BANK

## 2017-04-29 LAB — ABO/RH: ABO/RH(D): A POS

## 2017-04-29 LAB — RETICULOCYTES
RBC.: 2.02 MIL/uL — ABNORMAL LOW (ref 4.20–5.82)
RETIC COUNT ABSOLUTE: 38.4 10*3/uL (ref 34.8–93.9)
Retic Ct Pct: 1.9 % — ABNORMAL HIGH (ref 0.8–1.8)

## 2017-04-29 LAB — LACTATE DEHYDROGENASE: LDH: 262 U/L — ABNORMAL HIGH (ref 125–245)

## 2017-04-29 NOTE — Assessment & Plan Note (Signed)
Long-standing history of normocytic to macrocytic anemia, most recent hemoglobin 7.7 on 04/03/2017 Patient is extremely symptomatic.  Differential diagnosis: 1. Anemia due to chronic disease and inflammation 2. anemia due to renal dysfunction 3.  B12 and folate deficiencies, previous B12 levels were normal 4. Hemolysis 5. Plasma cell disorders myeloma 7. Bone marrow dysfunction with MDS  Workup performed: 1. CBC with differential to evaluate the smear 2. CMP to evaluate liver and kidney function 3. Haptoglobin, LDH, reticulocyte count to evaluate hemolysis 4. SPEP  Patient has an artificial heart valve and occasionally we have seen artificial valve falls causing hemolysis.  Because the patient is very symptomatic, I will obtain an hold tube for blood bank to see if he needs blood transfusion.  He will return back to see [place] in 1 week and I will assess the rest of the blood work to see if he needs a bone marrow biopsy.

## 2017-04-29 NOTE — Progress Notes (Signed)
[place] NOTE  Patient Care Team: [name], MD as PCP - General  CHIEF COMPLAINTS/PURPOSE OF CONSULTATION:  Severe anemia  HISTORY OF PRESENTING ILLNESS:  James Hardy 84 y.o. male is here because of recent diagnosis of worsening anemia. He had long standing anemia where his Hb runs between 9-10 grams but he was noted to havea  Hb of 7.7 and he was referred to [place]. He tells me that hes been feeling SOB to min exertion and dizziness. He has not noticed any bleeding. He had an artificial heart valve placed 2 years ago.  I reviewed her records extensively and collaborated the history with the patient.  MEDICAL HISTORY:  Past Medical History:  Diagnosis Date  . ANGULAR CHEILITIS 04/11/2009   Qualifier: Diagnosis of  By: Joyce Gross    . APHTHOUS ULCERS 12/19/2009   Qualifier: Diagnosis of  By: [name] MD, Bruce    . BENIGN PROSTATIC HYPERTROPHY, HX OF 08/21/2007   Qualifier: Diagnosis of  By: Jerral Ralph    . Cancer ([place])    left leg squamous cell removed  . CERUMEN IMPACTION 04/11/2009   Qualifier: Diagnosis of  By: Joyce Gross    . COLITIS, HX OF 08/21/2007   Qualifier: Diagnosis of  By: Jerral Ralph    . COLONIC POLYPS, HX OF 12/02/2008   Qualifier: Diagnosis of  By: [name] MD, Bruce    . DIVERTICULOSIS, COLON 08/28/2006   Qualifier: Diagnosis of  By: Jerral Ralph    . GERD 12/02/2008   Qualifier: Diagnosis of  By: [name] MD, Bruce    . H/O [facility] spotted fever   . HYPERLIPIDEMIA 04/11/2009   Qualifier: Diagnosis of  By: Joyce Gross    . INTERNAL HEMORRHOIDS 08/28/2006   Qualifier: Diagnosis of  By: Jerral Ralph    . Nocturia   . OSTEOPOROSIS 08/21/2007   Qualifier: Diagnosis of  By: Jerral Ralph    . Shortness of breath dyspnea    with exertion  . SLEEP APNEA 08/21/2007   Qualifier: Diagnosis of  By: Jerral Ralph      SURGICAL HISTORY: Past Surgical History:   Procedure Laterality Date  . CARDIAC CATHETERIZATION N/A 07/25/2015   Procedure: Right/Left Heart Cath and Coronary Angiography;  Surgeon: Sherren Mocha, MD;  Location: [facility];  Service: Cardiovascular;  Laterality: N/A;  . COLONOSCOPY    . EYE SURGERY    . [place]  2001  . INGUINAL HERNIA REPAIR Left 01/01/2017   Procedure: LAPAROSCOPIC REPAIR LEFT INGUINAL HERNIA WITH MESH;  Surgeon: [name], MD;  Location: [facility];  Service: General;  Laterality: Left;  . INSERTION OF MESH Left 01/01/2017   Procedure: INSERTION OF MESH;  Surgeon: [name], MD;  Location: [facility];  Service: General;  Laterality: Left;  . SKIN CANCER EXCISION     left leg  . TEE WITHOUT CARDIOVERSION N/A 08/08/2015   Procedure: TRANSESOPHAGEAL ECHOCARDIOGRAM (TEE);  Surgeon: Sherren Mocha, MD;  Location: [facility];  Service: Open Heart Surgery;  Laterality: N/A;  . TRANSCATHETER AORTIC VALVE REPLACEMENT, TRANSFEMORAL N/A 08/08/2015   Procedure: TRANSCATHETER AORTIC VALVE REPLACEMENT, TRANSFEMORAL;  Surgeon: Sherren Mocha, MD;  Location: [facility];  Service: Open Heart Surgery;  Laterality: N/A;    SOCIAL HISTORY: Social History   Socioeconomic History  . Marital status: Married    Spouse name: Not on file  . Number of children: Not on file  . Years of education: Not on file  . Highest  education level: Not on file  Social Needs  . Financial resource strain: Not on file  . Food insecurity - worry: Not on file  . Food insecurity - inability: Not on file  . Transportation needs - medical: Not on file  . Transportation needs - non-medical: Not on file  Occupational History  . Not on file  Tobacco Use  . Smoking status: Never Smoker  . Smokeless tobacco: Never Used  Substance and Sexual Activity  . Alcohol use: No    Alcohol/week: 0.0 oz  . Drug use: No  . Sexual activity: Yes  Other Topics Concern  . Not on file  Social History Narrative  . Not on file    FAMILY HISTORY: Family  History  Problem Relation Age of Onset  . Asthma Mother   . Heart disease Other     ALLERGIES:  is allergic to neosporin [neomycin-bacitracin zn-polymyx].  MEDICATIONS:  Current Outpatient Medications  Medication Sig Dispense Refill  . Calcium Citrate 250 MG TABS Take 500 mg by mouth daily.     . Cholecalciferol (VITAMIN D3) 5000 units TABS Take 5,000 Units by mouth daily.    . Coenzyme Q10 (CO Q 10) 100 MG CAPS Take 100 mg by mouth daily.    Marland Kitchen doxazosin (CARDURA) 8 MG tablet Take 1 tablet (8 mg total) by mouth at bedtime. 30 tablet 5  . ferrous sulfate 325 (65 FE) MG tablet Take 325 mg by mouth daily with breakfast.    . finasteride (PROSCAR) 5 MG tablet Take 5 mg by mouth daily.    Marland Kitchen glucosamine-chondroitin 500-400 MG tablet Take 2 tablets by mouth daily.     Marland Kitchen MAGNESIUM-ZINC PO Take 1 tablet by mouth daily.    . Multiple Vitamin (MULTIVITAMIN) tablet Take 1 tablet by mouth daily.    . NON FORMULARY CPAP machine at bedtime    . Omega-3 Fatty Acids (FISH OIL) 1200 MG CAPS Take 1,200 mg by mouth daily.     . pantoprazole (PROTONIX) 40 MG tablet Take 1 tablet (40 mg total) by mouth daily. 90 tablet 2  . Probiotic Product (PRO-BIOTIC BLEND) CAPS Take 1 capsule by mouth daily.     . tamsulosin (FLOMAX) 0.4 MG CAPS capsule Take 0.4 mg by mouth daily.      No current facility-administered medications for this visit.     REVIEW OF SYSTEMS:   Constitutional: Denies fevers, chills or abnormal night sweats Eyes: Denies blurriness of vision, double vision or watery eyes Ears, nose, mouth, throat, and face: Denies mucositis or sore throat Respiratory: SOB, fatigue Cardiovascular: Denies palpitation, chest discomfort or lower extremity swelling Gastrointestinal:  Denies nausea, heartburn or change in bowel habits Skin: Denies abnormal skin rashes Lymphatics: Denies new lymphadenopathy or easy bruising Neurological:Denies numbness, tingling or new weaknesses Behavioral/Psych: Mood is  stable, no new changes   All other systems were reviewed with the patient and are negative.  PHYSICAL EXAMINATION: ECOG PERFORMANCE STATUS: 1 - Symptomatic but completely ambulatory  Vitals:   04/29/17 1323  BP: (!) 124/44  Pulse: 78  Resp: 17  Temp: 97.9 F (36.6 C)  SpO2: 99%   Filed Weights   04/29/17 1323  Weight: 143 lb 14.4 oz (65.3 kg)    GENERAL:alert, no distress and comfortable SKIN: skin color, texture, turgor are normal, no rashes or significant lesions EYES: normal, conjunctiva are pink and non-injected, sclera clear OROPHARYNX:no exudate, no erythema and lips, buccal mucosa, and tongue normal  NECK: supple, thyroid normal size, non-tender,  without nodularity LYMPH:  no palpable lymphadenopathy in the cervical, axillary or inguinal LUNGS: clear to auscultation and percussion with normal breathing effort HEART: regular rate & rhythm and no murmurs and no lower extremity edema ABDOMEN:abdomen soft, non-tender and normal bowel sounds Musculoskeletal:no cyanosis of digits and no clubbing  PSYCH: alert & oriented x 3 with fluent speech NEURO: no focal motor/sensory deficits  LABORATORY DATA:  I have reviewed the data as listed Lab Results  Component Value Date   WBC 7.1 04/29/2017   HGB 8.7 (L) 12/27/2016   HCT 20.8 (L) 04/29/2017   MCV 102.9 (H) 04/29/2017   PLT 359 04/29/2017   Lab Results  Component Value Date   NA 139 04/29/2017   K 4.6 04/29/2017   CL 112 (H) 04/29/2017   CO2 21 (L) 04/29/2017    RADIOGRAPHIC STUDIES: I have personally reviewed the radiological reports and agreed with the findings in the report.  ASSESSMENT AND PLAN:  Macrocytic anemia Long-standing history of normocytic to macrocytic anemia, most recent hemoglobin 7.7 on 04/03/2017 Patient is extremely symptomatic.  Differential diagnosis: 1. Anemia due to chronic disease and inflammation 2. anemia due to renal dysfunction 3.  B12 and folate deficiencies, previous B12 levels  were normal 4. Hemolysis 5. Plasma cell disorders myeloma 7. Bone marrow dysfunction with MDS  Workup performed: 1. CBC with differential to evaluate the smear 2. CMP to evaluate liver and kidney function 3. Haptoglobin, LDH, reticulocyte count, DAT to evaluate hemolysis 4. SPEP  Patient has an artificial heart valve and occasionally we have seen artificial valve falls causing hemolysis.  Because the patient is very symptomatic, I will obtain an hold tube for blood bank to see if he needs blood transfusion.  He will return back to see [place] in 1 week and I will assess the rest of the blood work to see if he needs a bone marrow biopsy.  ADDENDUM: His Hb came back as 6.9 He will receive 2 units of PRBC tomorrow.  RTC in 1 week to review blood work from today  All questions were answered. The patient knows to call the clinic with any problems, questions or concerns.    Harriette Ohara, MD 04/29/17

## 2017-04-29 NOTE — Progress Notes (Signed)
Per Dr.Gudena, pt to receive 2 units of PRBC tomorrow. Orders placed and will notify blood bank of order. Sent message to scheduling to let them know to make appt for pt.

## 2017-04-30 ENCOUNTER — Telehealth: Payer: Self-pay | Admitting: *Deleted

## 2017-04-30 ENCOUNTER — Telehealth: Payer: Self-pay | Admitting: Hematology and Oncology

## 2017-04-30 ENCOUNTER — Ambulatory Visit (HOSPITAL_COMMUNITY)
Admission: RE | Admit: 2017-04-30 | Discharge: 2017-04-30 | Disposition: A | Discharge status: Home / Self Care | Payer: Medicare Other | Source: Ambulatory Visit | Attending: Hematology and Oncology | Admitting: Hematology and Oncology

## 2017-04-30 DIAGNOSIS — D508 Other iron deficiency anemias: Secondary | ICD-10-CM | POA: Insufficient documentation

## 2017-04-30 LAB — PREPARE RBC (CROSSMATCH)

## 2017-04-30 LAB — HAPTOGLOBIN: Haptoglobin: 94 mg/dL (ref 34–200)

## 2017-04-30 LAB — ERYTHROPOIETIN: Erythropoietin: 275.9 m[IU]/mL — ABNORMAL HIGH (ref 2.6–18.5)

## 2017-04-30 MED ORDER — DIPHENHYDRAMINE HCL 25 MG PO CAPS
25.0000 mg | ORAL_CAPSULE | Freq: Once | ORAL | Status: AC
  Administered 2017-04-30: 25 mg via ORAL
  Filled 2017-04-30: qty 1

## 2017-04-30 MED ORDER — SODIUM CHLORIDE 0.9 % IV SOLN: 250.0000 mL | Freq: Once | INTRAVENOUS | Status: DC

## 2017-04-30 MED ORDER — ACETAMINOPHEN 325 MG PO TABS
650.0000 mg | ORAL_TABLET | Freq: Once | ORAL | Status: AC
  Administered 2017-04-30: 650 mg via ORAL
  Filled 2017-04-30: qty 2

## 2017-04-30 NOTE — Telephone Encounter (Signed)
"  I never received a call yesterday for appointment today for blood transfusion.  What time do I need to be there today.  I'm still wearing blood bracelet as instructed for 72 hours.  No chest pain but short of breath if I do with a lot of activity."  Unable to determine scheduling needs or if pending infusion add-on.  Call transferred to nurse.

## 2017-04-30 NOTE — Telephone Encounter (Signed)
Called patient regarding 2/13

## 2017-04-30 NOTE — Discharge Instructions (Signed)

## 2017-04-30 NOTE — Telephone Encounter (Signed)
Called pt back to let him know that scheduling is still working a time for him to come in today.Will update pt once schedule is available.

## 2017-04-30 NOTE — Progress Notes (Signed)
[facility] NOTE  Diagnosis: Symptomatic anemia    Provider: Dr. Lindi Adie    Procedure: 2 units PRBC's   Note: Patient received transfusion of 2 units of blood. Patient tolerated transfusion well with no adverse reaction. Discharge instructions given to patient. Patient alert, oriented and ambulatory at discharge.

## 2017-05-01 LAB — BPAM RBC
BLOOD PRODUCT EXPIRATION DATE: 201902162359
Blood Product Expiration Date: 201902152359
ISSUE DATE / TIME: 201902061040
ISSUE DATE / TIME: 201902061040
UNIT TYPE AND RH: 6200
UNIT TYPE AND RH: 6200

## 2017-05-01 LAB — TYPE AND SCREEN
ABO/RH(D): A POS
ANTIBODY SCREEN: NEGATIVE
Unit division: 0
Unit division: 0

## 2017-05-02 LAB — MULTIPLE MYELOMA PANEL, SERUM
ALBUMIN/GLOB SERPL: 1.7 (ref 0.7–1.7)
ALPHA2 GLOB SERPL ELPH-MCNC: 0.5 g/dL (ref 0.4–1.0)
Albumin SerPl Elph-Mcnc: 3.5 g/dL (ref 2.9–4.4)
Alpha 1: 0.2 g/dL (ref 0.0–0.4)
B-Globulin SerPl Elph-Mcnc: 0.5 g/dL — ABNORMAL LOW (ref 0.7–1.3)
Gamma Glob SerPl Elph-Mcnc: 0.9 g/dL (ref 0.4–1.8)
Globulin, Total: 2.1 g/dL — ABNORMAL LOW (ref 2.2–3.9)
IGG (IMMUNOGLOBIN G), SERUM: 844 mg/dL (ref 700–1600)
IGM (IMMUNOGLOBULIN M), SRM: 216 mg/dL — AB (ref 15–143)
IgA: 42 mg/dL — ABNORMAL LOW (ref 61–437)
M Protein SerPl Elph-Mcnc: 0.3 g/dL — ABNORMAL HIGH
TOTAL PROTEIN ELP: 5.6 g/dL — AB (ref 6.0–8.5)

## 2017-05-07 ENCOUNTER — Telehealth: Payer: Self-pay | Admitting: Hematology and Oncology

## 2017-05-07 ENCOUNTER — Inpatient Hospital Stay (HOSPITAL_BASED_OUTPATIENT_CLINIC_OR_DEPARTMENT_OTHER): Payer: Medicare Other | Admitting: Hematology and Oncology

## 2017-05-07 DIAGNOSIS — D539 Nutritional anemia, unspecified: Secondary | ICD-10-CM | POA: Diagnosis not present

## 2017-05-07 NOTE — Telephone Encounter (Signed)
Gave avs and calendar for February  °

## 2017-05-07 NOTE — Assessment & Plan Note (Addendum)
Long-standing history of normocytic to macrocytic anemia, most recent hemoglobin 7.7 on 04/03/2017 2 units of packed red cells given 04/30/2017  04/29/2017: Labs: Hemoglobin 6.9, MCV 103 Creatinine 1.4, calcium 8.3, M protein: 0.3 g IgM kappa LDH 262, haptoglobin 94, DAT negative, reticulocytes: 1.9% Erythropoietin 276  Based on the blood work we can rule out multiple myeloma, hemolysis Still could be anemia of chronic kidney disease Or myelodysplastic syndrome.  I recommend that we perform bone marrow aspiration biopsy.  

## 2017-05-07 NOTE — Progress Notes (Signed)
Patient Care Team: [name], MD as PCP - General  DIAGNOSIS:  Encounter Diagnosis  Name Primary?  . Macrocytic anemia     CHIEF COMPLIANT: Follow-up to discuss the blood work done last week.  INTERVAL HISTORY: James Hardy is a 84 year old with above-mentioned history of macrocytic anemia who received 2 units of packed red cells for hemoglobin of 6.9.  He felt significantly better after the blood transfusion.  He is here today accompanied by his wife to discuss the next steps and the lab results.  REVIEW OF SYSTEMS:   Constitutional: Denies fevers, chills or abnormal weight loss Eyes: Denies blurriness of vision Ears, nose, mouth, throat, and face: Denies mucositis or sore throat Respiratory: Denies cough, dyspnea or wheezes Cardiovascular: Denies palpitation, chest discomfort Gastrointestinal:  Denies nausea, heartburn or change in bowel habits Skin: Denies abnormal skin rashes Lymphatics: Denies new lymphadenopathy or easy bruising Neurological:Denies numbness, tingling or new weaknesses Behavioral/Psych: Mood is stable, no new changes  Extremities: No lower extremity edema All other systems were reviewed with the patient and are negative.  I have reviewed the past medical history, past surgical history, social history and family history with the patient and they are unchanged from previous note.  ALLERGIES:  is allergic to neosporin [neomycin-bacitracin zn-polymyx].  MEDICATIONS:  Current Outpatient Medications  Medication Sig Dispense Refill  . Calcium Citrate 250 MG TABS Take 500 mg by mouth daily.     . Cholecalciferol (VITAMIN D3) 5000 units TABS Take 5,000 Units by mouth daily.    . Coenzyme Q10 (CO Q 10) 100 MG CAPS Take 100 mg by mouth daily.    Marland Kitchen doxazosin (CARDURA) 8 MG tablet Take 1 tablet (8 mg total) by mouth at bedtime. 30 tablet 5  . ferrous sulfate 325 (65 FE) MG tablet Take 325 mg by mouth daily with breakfast.    . finasteride (PROSCAR) 5 MG  tablet Take 5 mg by mouth daily.    Marland Kitchen glucosamine-chondroitin 500-400 MG tablet Take 2 tablets by mouth daily.     Marland Kitchen MAGNESIUM-ZINC PO Take 1 tablet by mouth daily.    . Multiple Vitamin (MULTIVITAMIN) tablet Take 1 tablet by mouth daily.    . NON FORMULARY CPAP machine at bedtime    . Omega-3 Fatty Acids (FISH OIL) 1200 MG CAPS Take 1,200 mg by mouth daily.     . pantoprazole (PROTONIX) 40 MG tablet Take 1 tablet (40 mg total) by mouth daily. 90 tablet 2  . Probiotic Product (PRO-BIOTIC BLEND) CAPS Take 1 capsule by mouth daily.     . tamsulosin (FLOMAX) 0.4 MG CAPS capsule Take 0.4 mg by mouth daily.      No current facility-administered medications for this visit.     PHYSICAL EXAMINATION: ECOG PERFORMANCE STATUS: 1 - Symptomatic but completely ambulatory  Vitals:   05/07/17 0855  BP: (!) 151/49  Pulse: 75  Resp: 17  Temp: 97.7 F (36.5 C)  SpO2: 98%   Filed Weights   05/07/17 0855  Weight: 142 lb 12.8 oz (64.8 kg)    GENERAL:alert, no distress and comfortable SKIN: skin color, texture, turgor are normal, no rashes or significant lesions EYES: normal, Conjunctiva are pink and non-injected, sclera clear OROPHARYNX:no exudate, no erythema and lips, buccal mucosa, and tongue normal  NECK: supple, thyroid normal size, non-tender, without nodularity LYMPH:  no palpable lymphadenopathy in the cervical, axillary or inguinal LUNGS: clear to auscultation and percussion with normal breathing effort HEART: regular rate & rhythm and  no murmurs and no lower extremity edema ABDOMEN:abdomen soft, non-tender and normal bowel sounds MUSCULOSKELETAL:no cyanosis of digits and no clubbing  NEURO: alert & oriented x 3 with fluent speech, no focal motor/sensory deficits EXTREMITIES: No lower extremity edema  LABORATORY DATA:  I have reviewed the data as listed CMP Latest Ref Rng & Units 04/29/2017 12/27/2016 09/26/2016  Glucose 70 - 140 mg/dL 98 104(H) 97  BUN 7 - 26 mg/dL 28(H) 26(H) 24(H)    Creatinine 0.70 - 1.30 mg/dL 1.40(H) 1.28(H) 1.28  Sodium 136 - 145 mmol/L 139 139 138  Potassium 3.5 - 5.1 mmol/L 4.6 4.9 5.5(H)  Chloride 98 - 109 mmol/L 112(H) 109 105  CO2 22 - 29 mmol/L 21(L) 23 29  Calcium 8.4 - 10.4 mg/dL 8.3(L) 8.4(L) 8.3(L)  Total Protein 6.4 - 8.3 g/dL 5.7(L) - 5.6(L)  Total Bilirubin 0.2 - 1.2 mg/dL 0.3 - 0.4  Alkaline Phos 40 - 150 U/L 41 - 36(L)  AST 5 - 34 U/L 17 - 16  ALT 0 - 55 U/L 13 - 11    Lab Results  Component Value Date   WBC 7.1 04/29/2017   HGB 8.7 (L) 12/27/2016   HCT 20.8 (L) 04/29/2017   MCV 102.9 (H) 04/29/2017   PLT 359 04/29/2017   NEUTROABS 4.9 04/29/2017    ASSESSMENT & PLAN:  Macrocytic anemia Long-standing history of normocytic to macrocytic anemia, most recent hemoglobin 7.7 on 04/03/2017 2 units of packed red cells given 04/30/2017  04/29/2017: Labs: Hemoglobin 6.9, MCV 103 Creatinine 1.4, calcium 8.3, M protein: 0.3 g IgM kappa LDH 262, haptoglobin 94, DAT negative, reticulocytes: 1.9% Erythropoietin 276  Based on the blood work we can rule out multiple myeloma, hemolysis  Still could be anemia of chronic kidney disease Or myelodysplastic syndrome.  I recommend that we perform bone marrow aspiration biopsy.  We will plan to do the bone marrow biopsy next Tuesday.  I spent 25 minutes talking to the patient of which more than half was spent in counseling and coordination of care.  No orders of the defined types were placed in this encounter.  The patient has a good understanding of the overall plan. he agrees with it. he will call with any problems that may develop before the next visit here.   Harriette Ohara, MD 05/07/17

## 2017-05-13 ENCOUNTER — Telehealth: Payer: Self-pay | Admitting: Hematology and Oncology

## 2017-05-13 ENCOUNTER — Inpatient Hospital Stay (HOSPITAL_BASED_OUTPATIENT_CLINIC_OR_DEPARTMENT_OTHER): Payer: Medicare Other

## 2017-05-13 ENCOUNTER — Inpatient Hospital Stay: Payer: Medicare Other

## 2017-05-13 VITALS — BP 143/50 | HR 61 | Temp 98.5°F | Resp 17

## 2017-05-13 DIAGNOSIS — D539 Nutritional anemia, unspecified: Secondary | ICD-10-CM

## 2017-05-13 DIAGNOSIS — D4989 Neoplasm of unspecified behavior of other specified sites: Secondary | ICD-10-CM | POA: Diagnosis not present

## 2017-05-13 LAB — CBC WITH DIFFERENTIAL/PLATELET
BASOS ABS: 0.1 10*3/uL (ref 0.0–0.1)
BASOS PCT: 2 %
Eosinophils Absolute: 0 10*3/uL (ref 0.0–0.5)
Eosinophils Relative: 0 %
HEMATOCRIT: 27.9 % — AB (ref 38.4–49.9)
HEMOGLOBIN: 8.9 g/dL — AB (ref 13.0–17.1)
Lymphocytes Relative: 17 %
Lymphs Abs: 1.1 10*3/uL (ref 0.9–3.3)
MCH: 32.4 pg (ref 27.2–33.4)
MCHC: 31.9 g/dL — ABNORMAL LOW (ref 32.0–36.0)
MCV: 101.5 fL — ABNORMAL HIGH (ref 79.3–98.0)
Monocytes Absolute: 1.4 10*3/uL — ABNORMAL HIGH (ref 0.1–0.9)
Monocytes Relative: 23 %
NEUTROS ABS: 3.7 10*3/uL (ref 1.5–6.5)
NEUTROS PCT: 58 %
Platelets: 339 10*3/uL (ref 140–400)
RBC: 2.75 MIL/uL — AB (ref 4.20–5.82)
RDW: 17.9 % — AB (ref 11.0–14.6)
WBC: 6.3 10*3/uL (ref 4.0–10.3)

## 2017-05-13 MED ORDER — LIDOCAINE HCL 2 % IJ SOLN
INTRAMUSCULAR | Status: AC
  Filled 2017-05-13: qty 20

## 2017-05-13 NOTE — Progress Notes (Signed)
Post bone marrow biopsy pocedure, VSS and dressing dry and intact.

## 2017-05-13 NOTE — Telephone Encounter (Signed)
Called patient regarding 2/26 and I left a message

## 2017-05-14 ENCOUNTER — Inpatient Hospital Stay (HOSPITAL_BASED_OUTPATIENT_CLINIC_OR_DEPARTMENT_OTHER): Payer: Medicare Other | Admitting: Hematology and Oncology

## 2017-05-14 DIAGNOSIS — D539 Nutritional anemia, unspecified: Secondary | ICD-10-CM | POA: Diagnosis present

## 2017-05-14 NOTE — Progress Notes (Signed)
  Date of procedure: 05/13/2017 INDICATION: Macrocytic anemia of unclear etiology  Brief examination was performed. ENT: adequate airway clearance Heart: regular rate and rhythm.No Murmurs Lungs: clear to auscultation, no wheezes, normal respiratory effort  Bone Marrow Biopsy and Aspiration Procedure Note   Informed consent was obtained and potential risks including bleeding, infection and pain were reviewed with the patient.  The patient's name, date of birth, identification, consent and allergies were verified prior to the start of procedure and time out was performed.  The left posterior iliac crest was chosen as the site of biopsy.  The skin was prepped with ChloraPrep.   8 cc of 1% lidocaine was used to provide local anaesthesia.   10 cc of bone marrow aspirate was obtained followed by 1cm biopsy.  Pressure was applied to the biopsy site and bandage was placed over the biopsy site. Patient was made to lie on the back for 15 mins prior to discharge.  The procedure was tolerated well. COMPLICATIONS: None BLOOD LOSS: none The patient was discharged home in stable condition with a 1 week follow up to review results.  Patient was provided with post bone marrow biopsy instructions and instructed to call if there was any bleeding or worsening pain.  Specimens sent for flow cytometry, cytogenetics and additional studies.  Signed Harriette Ohara, MD

## 2017-05-15 DIAGNOSIS — Z08 Encounter for follow-up examination after completed treatment for malignant neoplasm: Secondary | ICD-10-CM | POA: Diagnosis not present

## 2017-05-15 DIAGNOSIS — L570 Actinic keratosis: Secondary | ICD-10-CM | POA: Diagnosis not present

## 2017-05-15 DIAGNOSIS — X32XXXD Exposure to sunlight, subsequent encounter: Secondary | ICD-10-CM | POA: Diagnosis not present

## 2017-05-15 DIAGNOSIS — Z85828 Personal history of other malignant neoplasm of skin: Secondary | ICD-10-CM | POA: Diagnosis not present

## 2017-05-20 ENCOUNTER — Inpatient Hospital Stay (HOSPITAL_BASED_OUTPATIENT_CLINIC_OR_DEPARTMENT_OTHER): Payer: Medicare Other | Admitting: Hematology and Oncology

## 2017-05-20 ENCOUNTER — Telehealth: Payer: Self-pay | Admitting: Hematology and Oncology

## 2017-05-20 DIAGNOSIS — D539 Nutritional anemia, unspecified: Secondary | ICD-10-CM

## 2017-05-20 NOTE — Telephone Encounter (Signed)
Gave patient AVS and calendar of upcoming march appointments.

## 2017-05-20 NOTE — Progress Notes (Signed)
Patient Care Team: [name], MD as PCP - General  DIAGNOSIS:  Encounter Diagnosis  Name Primary?  . Macrocytic anemia    CHIEF COMPLIANT: Patient is here to discuss results of bone marrow biopsy  INTERVAL HISTORY: James Hardy is a 84 year old gentleman with a prior history of microcytic anemia.  He received blood transfusion previously.  He is underwent a bone marrow biopsy and is here to discuss the results.  He did not have any pain or discomfort in the biopsy.  He feels somewhat tired but overall he does not have the same degree of fatigue as he was when he first saw [place].  REVIEW OF SYSTEMS:   Constitutional: Denies fevers, chills or abnormal weight loss Eyes: Denies blurriness of vision Ears, nose, mouth, throat, and face: Denies mucositis or sore throat Respiratory: Denies cough, dyspnea or wheezes Cardiovascular: Denies palpitation, chest discomfort Gastrointestinal:  Denies nausea, heartburn or change in bowel habits Skin: Denies abnormal skin rashes Lymphatics: Denies new lymphadenopathy or easy bruising Neurological:Denies numbness, tingling or new weaknesses Behavioral/Psych: Mood is stable, no new changes  Extremities: No lower extremity edema All other systems were reviewed with the patient and are negative.  I have reviewed the past medical history, past surgical history, social history and family history with the patient and they are unchanged from previous note.  ALLERGIES:  is allergic to neosporin [neomycin-bacitracin zn-polymyx].  MEDICATIONS:  Current Outpatient Medications  Medication Sig Dispense Refill  . Calcium Citrate 250 MG TABS Take 500 mg by mouth daily.     . Cholecalciferol (VITAMIN D3) 5000 units TABS Take 5,000 Units by mouth daily.    . Coenzyme Q10 (CO Q 10) 100 MG CAPS Take 100 mg by mouth daily.    Marland Kitchen doxazosin (CARDURA) 8 MG tablet Take 1 tablet (8 mg total) by mouth at bedtime. 30 tablet 5  . ferrous sulfate 325 (65 FE) MG  tablet Take 325 mg by mouth daily with breakfast.    . finasteride (PROSCAR) 5 MG tablet Take 5 mg by mouth daily.    Marland Kitchen glucosamine-chondroitin 500-400 MG tablet Take 2 tablets by mouth daily.     Marland Kitchen MAGNESIUM-ZINC PO Take 1 tablet by mouth daily.    . Multiple Vitamin (MULTIVITAMIN) tablet Take 1 tablet by mouth daily.    . NON FORMULARY CPAP machine at bedtime    . Omega-3 Fatty Acids (FISH OIL) 1200 MG CAPS Take 1,200 mg by mouth daily.     . pantoprazole (PROTONIX) 40 MG tablet Take 1 tablet (40 mg total) by mouth daily. 90 tablet 2  . Probiotic Product (PRO-BIOTIC BLEND) CAPS Take 1 capsule by mouth daily.     . tamsulosin (FLOMAX) 0.4 MG CAPS capsule Take 0.4 mg by mouth daily.      No current facility-administered medications for this visit.     PHYSICAL EXAMINATION: ECOG PERFORMANCE STATUS: 1 - Symptomatic but completely ambulatory  Vitals:   05/20/17 0951  BP: (!) 145/48  Pulse: 68  Resp: 16  Temp: 97.7 F (36.5 C)  SpO2: 100%   Filed Weights   05/20/17 0951  Weight: 145 lb 4.8 oz (65.9 kg)    GENERAL:alert, no distress and comfortable SKIN: skin color, texture, turgor are normal, no rashes or significant lesions EYES: normal, Conjunctiva are pink and non-injected, sclera clear OROPHARYNX:no exudate, no erythema and lips, buccal mucosa, and tongue normal  NECK: supple, thyroid normal size, non-tender, without nodularity LYMPH:  no palpable lymphadenopathy in the  cervical, axillary or inguinal LUNGS: clear to auscultation and percussion with normal breathing effort HEART: regular rate & rhythm and no murmurs and no lower extremity edema ABDOMEN:abdomen soft, non-tender and normal bowel sounds MUSCULOSKELETAL:no cyanosis of digits and no clubbing  NEURO: alert & oriented x 3 with fluent speech, no focal motor/sensory deficits EXTREMITIES: No lower extremity edema  LABORATORY DATA:  I have reviewed the data as listed CMP Latest Ref Rng & Units 04/29/2017 12/27/2016  09/26/2016  Glucose 70 - 140 mg/dL 98 104(H) 97  BUN 7 - 26 mg/dL 28(H) 26(H) 24(H)  Creatinine 0.70 - 1.30 mg/dL 1.40(H) 1.28(H) 1.28  Sodium 136 - 145 mmol/L 139 139 138  Potassium 3.5 - 5.1 mmol/L 4.6 4.9 5.5(H)  Chloride 98 - 109 mmol/L 112(H) 109 105  CO2 22 - 29 mmol/L 21(L) 23 29  Calcium 8.4 - 10.4 mg/dL 8.3(L) 8.4(L) 8.3(L)  Total Protein 6.4 - 8.3 g/dL 5.7(L) - 5.6(L)  Total Bilirubin 0.2 - 1.2 mg/dL 0.3 - 0.4  Alkaline Phos 40 - 150 U/L 41 - 36(L)  AST 5 - 34 U/L 17 - 16  ALT 0 - 55 U/L 13 - 11    Lab Results  Component Value Date   WBC 6.3 05/13/2017   HGB 8.9 (L) 05/13/2017   HCT 27.9 (L) 05/13/2017   MCV 101.5 (H) 05/13/2017   PLT 339 05/13/2017   NEUTROABS 3.7 05/13/2017    ASSESSMENT & PLAN:  Macrocytic anemia Severe anemia required blood transfusion Bone marrow biopsy 05/13/2017: Markedly hypercellular marrow, cellularity 9200%, striking increase in megakaryocytes most of which are abnormal with hypo-lobation.  Blasts not identified, several small clusters of plasma cells 3% with kappa light chain restriction.  Iron stores abundant, Given the fact that the patient has increased monocytes in the peripheral blood, CMML is still in the differential.  MDS is the most likely final diagnosis.  Cytogenetics and FISH panels are pending.  If the diagnosis of MDS with 5 q. minus is confirmed that he may be treated with Revlimid. If the diagnosis of MDS without 5 q. minus is confirmed then he may need by Vidaza.  Return to clinic in 2 weeks for blood count check and follow-up.     I spent 25 minutes talking to the patient of which more than half was spent in counseling and coordination of care.  Orders Placed This Encounter  Procedures  . CBC with Differential (Cancer Center Only)    Standing Status:   Future    Standing Expiration Date:   05/20/2018  . Sample to Blood Bank    Standing Status:   Future    Standing Expiration Date:   05/20/2018   The patient has a  good understanding of the overall plan. he agrees with it. he will call with any problems that may develop before the next visit here.   Harriette Ohara, MD 05/20/17

## 2017-05-20 NOTE — Assessment & Plan Note (Signed)
Severe anemia required blood transfusion Bone marrow biopsy 05/13/2017: Markedly hypercellular marrow, cellularity 9200%, striking increase in megakaryocytes most of which are abnormal with hypo-lobation.  Blasts not identified, several small clusters of plasma cells 3% with kappa light chain restriction.  Iron stores abundant, Given the fact that the patient has increased monocytes in the peripheral blood, CMML is still in the differential.  MDS is the most likely final diagnosis.  Cytogenetics and FISH panels are pending.  If the diagnosis of MDS with 5 q. minus is confirmed that he may be treated with Revlimid. If the diagnosis of MDS without 5 q. minus is confirmed then he may need by Vidaza.  Return to clinic in 2 weeks for blood count check and follow-up.

## 2017-05-23 ENCOUNTER — Ambulatory Visit (HOSPITAL_COMMUNITY)
Admission: RE | Admit: 2017-05-23 | Discharge: 2017-05-23 | Disposition: A | Discharge status: Home / Self Care | Payer: Medicare Other | Source: Ambulatory Visit | Attending: Hematology and Oncology | Admitting: Hematology and Oncology

## 2017-05-23 DIAGNOSIS — D539 Nutritional anemia, unspecified: Secondary | ICD-10-CM | POA: Insufficient documentation

## 2017-05-28 ENCOUNTER — Encounter (HOSPITAL_COMMUNITY): Payer: Self-pay

## 2017-05-30 LAB — TISSUE HYBRIDIZATION (BONE MARROW)-NCBH

## 2017-05-30 LAB — CHROMOSOME ANALYSIS, BONE MARROW

## 2017-06-03 ENCOUNTER — Inpatient Hospital Stay (HOSPITAL_BASED_OUTPATIENT_CLINIC_OR_DEPARTMENT_OTHER): Payer: Medicare Other | Admitting: Hematology and Oncology

## 2017-06-03 ENCOUNTER — Inpatient Hospital Stay: Payer: Medicare Other | Attending: Hematology and Oncology

## 2017-06-03 ENCOUNTER — Telehealth: Payer: Self-pay | Admitting: Hematology and Oncology

## 2017-06-03 ENCOUNTER — Inpatient Hospital Stay: Payer: Medicare Other

## 2017-06-03 ENCOUNTER — Other Ambulatory Visit: Payer: Self-pay

## 2017-06-03 VITALS — BP 144/49 | HR 64 | Temp 97.7°F | Resp 17 | Ht 62.0 in | Wt 146.8 lb

## 2017-06-03 DIAGNOSIS — D469 Myelodysplastic syndrome, unspecified: Secondary | ICD-10-CM | POA: Diagnosis not present

## 2017-06-03 DIAGNOSIS — D509 Iron deficiency anemia, unspecified: Secondary | ICD-10-CM | POA: Insufficient documentation

## 2017-06-03 DIAGNOSIS — D539 Nutritional anemia, unspecified: Secondary | ICD-10-CM

## 2017-06-03 DIAGNOSIS — R5383 Other fatigue: Secondary | ICD-10-CM | POA: Insufficient documentation

## 2017-06-03 DIAGNOSIS — Z79899 Other long term (current) drug therapy: Secondary | ICD-10-CM | POA: Diagnosis not present

## 2017-06-03 DIAGNOSIS — R600 Localized edema: Secondary | ICD-10-CM | POA: Diagnosis not present

## 2017-06-03 DIAGNOSIS — R0602 Shortness of breath: Secondary | ICD-10-CM | POA: Diagnosis not present

## 2017-06-03 LAB — CBC WITH DIFFERENTIAL (CANCER CENTER ONLY)
BASOS PCT: 2 %
Basophils Absolute: 0.1 10*3/uL (ref 0.0–0.1)
EOS PCT: 1 %
Eosinophils Absolute: 0 10*3/uL (ref 0.0–0.5)
HEMATOCRIT: 22.9 % — AB (ref 38.4–49.9)
Hemoglobin: 7.2 g/dL — ABNORMAL LOW (ref 13.0–17.1)
Lymphocytes Relative: 17 %
Lymphs Abs: 1 10*3/uL (ref 0.9–3.3)
MCH: 31.9 pg (ref 27.2–33.4)
MCHC: 31.4 g/dL — AB (ref 32.0–36.0)
MCV: 101.3 fL — AB (ref 79.3–98.0)
MONO ABS: 1.8 10*3/uL — AB (ref 0.1–0.9)
MONOS PCT: 30 %
NEUTROS ABS: 3 10*3/uL (ref 1.5–6.5)
Neutrophils Relative %: 50 %
PLATELETS: 343 10*3/uL (ref 140–400)
RBC: 2.26 MIL/uL — ABNORMAL LOW (ref 4.20–5.82)
RDW: 18.7 % — AB (ref 11.0–14.6)
WBC Count: 5.9 10*3/uL (ref 4.0–10.3)

## 2017-06-03 LAB — SAMPLE TO BLOOD BANK

## 2017-06-03 LAB — PREPARE RBC (CROSSMATCH)

## 2017-06-03 MED ORDER — DIPHENHYDRAMINE HCL 25 MG PO CAPS
25.0000 mg | ORAL_CAPSULE | Freq: Once | ORAL | Status: AC
  Administered 2017-06-03: 25 mg via ORAL

## 2017-06-03 MED ORDER — DIPHENHYDRAMINE HCL 25 MG PO CAPS
ORAL_CAPSULE | ORAL | Status: AC
  Filled 2017-06-03: qty 1

## 2017-06-03 MED ORDER — ACETAMINOPHEN 325 MG PO TABS
ORAL_TABLET | ORAL | Status: AC
  Filled 2017-06-03: qty 2

## 2017-06-03 MED ORDER — ACETAMINOPHEN 325 MG PO TABS
650.0000 mg | ORAL_TABLET | Freq: Once | ORAL | Status: AC
  Administered 2017-06-03: 650 mg via ORAL

## 2017-06-03 MED ORDER — SODIUM CHLORIDE 0.9 % IV SOLN
250.0000 mL | Freq: Once | INTRAVENOUS | Status: AC
  Administered 2017-06-03: 250 mL via INTRAVENOUS

## 2017-06-03 NOTE — Progress Notes (Signed)
Patient Care Team: [name], MD as PCP - General  DIAGNOSIS:  Encounter Diagnoses  Name Primary?  . Macrocytic anemia   . MDS (myelodysplastic syndrome) (HCC) Yes    CHIEF COMPLIANT: Follow-up of severe macrocytic anemia requiring blood transfusion  INTERVAL HISTORY: James Hardy is a 84 year old with microcytic anemia who received blood transfusion over a month ago and felt better and we had performed extensive workup including a bone marrow biopsy.  There was no leukemia or lymphoma in the bone marrow but they were evidence of MDS.  Cytogenetics did not reveal any abnormalities.  He is here today to review his blood work and to discuss treatment options.  His wife and his son are accompanying him today.  He is starting to feel more shortness of breath and fatigue.  He is also noticed some leg swelling  REVIEW OF SYSTEMS:   Constitutional: Denies fevers, chills or abnormal weight loss Eyes: Denies blurriness of vision Ears, nose, mouth, throat, and face: Denies mucositis or sore throat Respiratory: Denies cough, dyspnea or wheezes Cardiovascular: Denies palpitation, chest discomfort Gastrointestinal:  Denies nausea, heartburn or change in bowel habits Skin: Denies abnormal skin rashes Lymphatics: Denies new lymphadenopathy or easy bruising Neurological:Denies numbness, tingling or new weaknesses Behavioral/Psych: Mood is stable, no new changes  Extremities: 1+ lower extremity edema All other systems were reviewed with the patient and are negative.  I have reviewed the past medical history, past surgical history, social history and family history with the patient and they are unchanged from previous note.  ALLERGIES:  is allergic to neosporin [neomycin-bacitracin zn-polymyx].  MEDICATIONS:  Current Outpatient Medications  Medication Sig Dispense Refill  . Calcium Citrate 250 MG TABS Take 500 mg by mouth daily.     . Cholecalciferol (VITAMIN D3) 5000 units TABS Take  5,000 Units by mouth daily.    . Coenzyme Q10 (CO Q 10) 100 MG CAPS Take 100 mg by mouth daily.    Marland Kitchen doxazosin (CARDURA) 8 MG tablet Take 1 tablet (8 mg total) by mouth at bedtime. 30 tablet 5  . ferrous sulfate 325 (65 FE) MG tablet Take 325 mg by mouth daily with breakfast.    . finasteride (PROSCAR) 5 MG tablet Take 5 mg by mouth daily.    Marland Kitchen glucosamine-chondroitin 500-400 MG tablet Take 2 tablets by mouth daily.     Marland Kitchen MAGNESIUM-ZINC PO Take 1 tablet by mouth daily.    . Multiple Vitamin (MULTIVITAMIN) tablet Take 1 tablet by mouth daily.    . NON FORMULARY CPAP machine at bedtime    . Omega-3 Fatty Acids (FISH OIL) 1200 MG CAPS Take 1,200 mg by mouth daily.     . pantoprazole (PROTONIX) 40 MG tablet Take 1 tablet (40 mg total) by mouth daily. 90 tablet 2  . Probiotic Product (PRO-BIOTIC BLEND) CAPS Take 1 capsule by mouth daily.     . tamsulosin (FLOMAX) 0.4 MG CAPS capsule Take 0.4 mg by mouth daily.      No current facility-administered medications for this visit.     PHYSICAL EXAMINATION: ECOG PERFORMANCE STATUS: 2 - Symptomatic, <50% confined to bed  Vitals:   06/03/17 0904  BP: (!) 144/49  Pulse: 64  Resp: 17  Temp: 97.7 F (36.5 C)  SpO2: 100%   Filed Weights   06/03/17 0904  Weight: 146 lb 12.8 oz (66.6 kg)    GENERAL:alert, no distress and comfortable SKIN: skin color, texture, turgor are normal, no rashes or significant lesions  EYES: normal, Conjunctiva are pink and non-injected, sclera clear OROPHARYNX:no exudate, no erythema and lips, buccal mucosa, and tongue normal  NECK: supple, thyroid normal size, non-tender, without nodularity LYMPH:  no palpable lymphadenopathy in the cervical, axillary or inguinal LUNGS: clear to auscultation and percussion with normal breathing effort HEART: regular rate & rhythm and no murmurs and no lower extremity edema ABDOMEN:abdomen soft, non-tender and normal bowel sounds MUSCULOSKELETAL:no cyanosis of digits and no clubbing    NEURO: alert & oriented x 3 with fluent speech, no focal motor/sensory deficits EXTREMITIES: No lower extremity edema He is once again starting to feel more shortness of breath and fatigue  LABORATORY DATA:  I have reviewed the data as listed CMP Latest Ref Rng & Units 04/29/2017 12/27/2016 09/26/2016  Glucose 70 - 140 mg/dL 98 104(H) 97  BUN 7 - 26 mg/dL 28(H) 26(H) 24(H)  Creatinine 0.70 - 1.30 mg/dL 1.40(H) 1.28(H) 1.28  Sodium 136 - 145 mmol/L 139 139 138  Potassium 3.5 - 5.1 mmol/L 4.6 4.9 5.5(H)  Chloride 98 - 109 mmol/L 112(H) 109 105  CO2 22 - 29 mmol/L 21(L) 23 29  Calcium 8.4 - 10.4 mg/dL 8.3(L) 8.4(L) 8.3(L)  Total Protein 6.4 - 8.3 g/dL 5.7(L) - 5.6(L)  Total Bilirubin 0.2 - 1.2 mg/dL 0.3 - 0.4  Alkaline Phos 40 - 150 U/L 41 - 36(L)  AST 5 - 34 U/L 17 - 16  ALT 0 - 55 U/L 13 - 11    Lab Results  Component Value Date   WBC 5.9 06/03/2017   HGB 8.9 (L) 05/13/2017   HCT 22.9 (L) 06/03/2017   MCV 101.3 (H) 06/03/2017   PLT 343 06/03/2017   NEUTROABS 3.0 06/03/2017    ASSESSMENT & PLAN:  Macrocytic anemia most consistent with myelodysplastic syndrome Severe anemia required blood transfusion Bone marrow biopsy 05/13/2017: Markedly hypercellular marrow, cellularity 9200%, striking increase in megakaryocytes most of which are abnormal with hypo-lobation.  Blasts not identified, several small clusters of plasma cells 3% with kappa light chain restriction.  Iron stores abundant, Given the fact that the patient has increased monocytes in the peripheral blood, CMML is still in the differential.  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 Return to clinic in 1 month with labs blood transfusions and follow-ups for Aranesp  I spent 25 minutes talking to the patient of which more than half was spent in counseling and coordination of care.  Orders  Placed This Encounter  Procedures  . CBC with Differential (Cancer Center Only)    Standing Status:   Future    Standing Expiration Date:   06/04/2018  . Reticulocytes    Standing Status:   Future    Standing Expiration Date:   06/04/2018  . Sample to Blood Bank    Standing Status:   Future    Number of Occurrences:   1    Standing Expiration Date:   06/04/2018   The patient has a good understanding of the overall plan. he agrees with it. he will call with any problems that may develop before the next visit here.   Harriette Ohara, MD 06/03/17

## 2017-06-03 NOTE — Telephone Encounter (Signed)
Gave avs and calendar for march and april °

## 2017-06-03 NOTE — Assessment & Plan Note (Signed)
Severe anemia required blood transfusion Bone marrow biopsy 05/13/2017: Markedly hypercellular marrow, cellularity 9200%, striking increase in megakaryocytes most of which are abnormal with hypo-lobation.  Blasts not identified, several small clusters of plasma cells 3% with kappa light chain restriction.  Iron stores abundant, Given the fact that the patient has increased monocytes in the peripheral blood, CMML is still in the differential.  Cytogenetics: 59 XY (19) 46 XY, I (17)q 10 (1) He did not have any cytogenetic abnormalities associated with MDS or AML Plan: Continue supportive care with transfusions or growth factor injections as needed.

## 2017-06-03 NOTE — Progress Notes (Signed)
Pt to receive 2units prbc for hfg 7.2, per Dr.Gudena. Orders placed and notified blood bank and infusion charge RN.

## 2017-06-03 NOTE — Progress Notes (Signed)
Pt to receive 2units prbc  For hg 7.2, per Dr.Gudena. Orders placed and notified blood bank and infusion charge RN.

## 2017-06-03 NOTE — Patient Instructions (Signed)

## 2017-06-04 LAB — BPAM RBC
BLOOD PRODUCT EXPIRATION DATE: 201903312359
Blood Product Expiration Date: 201903132359
ISSUE DATE / TIME: 201903121121
ISSUE DATE / TIME: 201903121118
Unit Type and Rh: 6200
Unit Type and Rh: 6200

## 2017-06-04 LAB — TYPE AND SCREEN
ABO/RH(D): A POS
ANTIBODY SCREEN: NEGATIVE
Unit division: 0
Unit division: 0

## 2017-06-10 ENCOUNTER — Inpatient Hospital Stay: Payer: Medicare Other

## 2017-06-10 VITALS — BP 135/48 | HR 66 | Resp 18

## 2017-06-10 DIAGNOSIS — I2510 Atherosclerotic heart disease of native coronary artery without angina pectoris: Secondary | ICD-10-CM | POA: Diagnosis not present

## 2017-06-10 DIAGNOSIS — R600 Localized edema: Secondary | ICD-10-CM | POA: Diagnosis not present

## 2017-06-10 DIAGNOSIS — D469 Myelodysplastic syndrome, unspecified: Secondary | ICD-10-CM | POA: Diagnosis not present

## 2017-06-10 DIAGNOSIS — E785 Hyperlipidemia, unspecified: Secondary | ICD-10-CM | POA: Diagnosis not present

## 2017-06-10 DIAGNOSIS — R0602 Shortness of breath: Secondary | ICD-10-CM | POA: Diagnosis not present

## 2017-06-10 DIAGNOSIS — K219 Gastro-esophageal reflux disease without esophagitis: Secondary | ICD-10-CM | POA: Diagnosis not present

## 2017-06-10 DIAGNOSIS — D539 Nutritional anemia, unspecified: Secondary | ICD-10-CM

## 2017-06-10 DIAGNOSIS — G4733 Obstructive sleep apnea (adult) (pediatric): Secondary | ICD-10-CM | POA: Diagnosis not present

## 2017-06-10 DIAGNOSIS — Z952 Presence of prosthetic heart valve: Secondary | ICD-10-CM | POA: Diagnosis not present

## 2017-06-10 DIAGNOSIS — R5383 Other fatigue: Secondary | ICD-10-CM | POA: Diagnosis not present

## 2017-06-10 DIAGNOSIS — Z79899 Other long term (current) drug therapy: Secondary | ICD-10-CM | POA: Diagnosis not present

## 2017-06-10 LAB — CBC WITH DIFFERENTIAL (CANCER CENTER ONLY)
BASOS ABS: 0.1 10*3/uL (ref 0.0–0.1)
Basophils Relative: 2 %
Eosinophils Absolute: 0 10*3/uL (ref 0.0–0.5)
Eosinophils Relative: 1 %
HEMATOCRIT: 27 % — AB (ref 38.4–49.9)
Hemoglobin: 8.7 g/dL — ABNORMAL LOW (ref 13.0–17.1)
LYMPHS ABS: 0.8 10*3/uL — AB (ref 0.9–3.3)
LYMPHS PCT: 13 %
MCH: 31.4 pg (ref 27.2–33.4)
MCHC: 32.2 g/dL (ref 32.0–36.0)
MCV: 97.5 fL (ref 79.3–98.0)
Monocytes Absolute: 1.5 10*3/uL — ABNORMAL HIGH (ref 0.1–0.9)
Monocytes Relative: 25 %
NEUTROS ABS: 3.6 10*3/uL (ref 1.5–6.5)
Neutrophils Relative %: 59 %
Platelet Count: 277 10*3/uL (ref 140–400)
RBC: 2.77 MIL/uL — AB (ref 4.20–5.82)
RDW: 18.3 % — ABNORMAL HIGH (ref 11.0–14.6)
WBC: 6.1 10*3/uL (ref 4.0–10.3)

## 2017-06-10 LAB — RETICULOCYTES
RBC.: 2.77 MIL/uL — ABNORMAL LOW (ref 4.20–5.82)
RETIC COUNT ABSOLUTE: 24.9 10*3/uL — AB (ref 34.8–93.9)
Retic Ct Pct: 0.9 % (ref 0.8–1.8)

## 2017-06-10 MED ORDER — DARBEPOETIN ALFA 300 MCG/0.6ML IJ SOSY
PREFILLED_SYRINGE | INTRAMUSCULAR | Status: AC
  Filled 2017-06-10: qty 0.6

## 2017-06-10 MED ORDER — DARBEPOETIN ALFA 300 MCG/0.6ML IJ SOSY
300.0000 ug | PREFILLED_SYRINGE | Freq: Once | INTRAMUSCULAR | Status: AC
  Administered 2017-06-10: 300 ug via SUBCUTANEOUS

## 2017-06-24 DIAGNOSIS — G4733 Obstructive sleep apnea (adult) (pediatric): Secondary | ICD-10-CM | POA: Diagnosis not present

## 2017-06-24 DIAGNOSIS — K219 Gastro-esophageal reflux disease without esophagitis: Secondary | ICD-10-CM | POA: Diagnosis not present

## 2017-06-24 DIAGNOSIS — R0602 Shortness of breath: Secondary | ICD-10-CM | POA: Diagnosis not present

## 2017-06-24 DIAGNOSIS — I2510 Atherosclerotic heart disease of native coronary artery without angina pectoris: Secondary | ICD-10-CM | POA: Diagnosis not present

## 2017-06-24 DIAGNOSIS — E785 Hyperlipidemia, unspecified: Secondary | ICD-10-CM | POA: Diagnosis not present

## 2017-06-24 DIAGNOSIS — Z952 Presence of prosthetic heart valve: Secondary | ICD-10-CM | POA: Diagnosis not present

## 2017-06-24 DIAGNOSIS — D469 Myelodysplastic syndrome, unspecified: Secondary | ICD-10-CM | POA: Diagnosis not present

## 2017-06-30 ENCOUNTER — Other Ambulatory Visit: Payer: Self-pay

## 2017-06-30 DIAGNOSIS — D539 Nutritional anemia, unspecified: Secondary | ICD-10-CM

## 2017-06-30 NOTE — Assessment & Plan Note (Signed)
Severe anemia required blood transfusion Bone marrow biopsy 05/13/2017: Markedly hypercellular marrow, cellularity 9200%, striking increase in megakaryocytes most of which are abnormal with hypo-lobation.  Blasts not identified, several small clusters of plasma cells 3% with kappa light chain restriction.  Iron stores abundant, Given the fact that the patient has increased monocytes in the peripheral blood, CMML is still in the differential.  Cytogenetics: 42 XY (19) 46 XY, I (17)q 10 (1) He did not have any cytogenetic abnormalities associated with MDS or AML Plan: Continue supportive care with transfusions or growth factor injections as needed.  Lab Review 07/01/17:

## 2017-07-01 ENCOUNTER — Other Ambulatory Visit: Payer: Self-pay

## 2017-07-01 ENCOUNTER — Telehealth: Payer: Self-pay | Admitting: Hematology and Oncology

## 2017-07-01 ENCOUNTER — Inpatient Hospital Stay: Payer: Medicare Other

## 2017-07-01 ENCOUNTER — Inpatient Hospital Stay (HOSPITAL_BASED_OUTPATIENT_CLINIC_OR_DEPARTMENT_OTHER): Payer: Medicare Other | Admitting: Hematology and Oncology

## 2017-07-01 ENCOUNTER — Inpatient Hospital Stay: Payer: Medicare Other | Attending: Hematology and Oncology

## 2017-07-01 VITALS — BP 131/57 | HR 70 | Temp 97.7°F | Resp 16

## 2017-07-01 DIAGNOSIS — R42 Dizziness and giddiness: Secondary | ICD-10-CM

## 2017-07-01 DIAGNOSIS — R0602 Shortness of breath: Secondary | ICD-10-CM | POA: Insufficient documentation

## 2017-07-01 DIAGNOSIS — D539 Nutritional anemia, unspecified: Secondary | ICD-10-CM | POA: Diagnosis not present

## 2017-07-01 DIAGNOSIS — D469 Myelodysplastic syndrome, unspecified: Secondary | ICD-10-CM | POA: Insufficient documentation

## 2017-07-01 DIAGNOSIS — R5383 Other fatigue: Secondary | ICD-10-CM | POA: Insufficient documentation

## 2017-07-01 LAB — CBC WITH DIFFERENTIAL (CANCER CENTER ONLY)
Basophils Absolute: 0.1 10*3/uL (ref 0.0–0.1)
Basophils Relative: 3 %
EOS PCT: 1 %
Eosinophils Absolute: 0 10*3/uL (ref 0.0–0.5)
HCT: 21.5 % — ABNORMAL LOW (ref 38.4–49.9)
Hemoglobin: 7.1 g/dL — ABNORMAL LOW (ref 13.0–17.1)
LYMPHS PCT: 17 %
Lymphs Abs: 0.8 10*3/uL — ABNORMAL LOW (ref 0.9–3.3)
MCH: 31.8 pg (ref 27.2–33.4)
MCHC: 32.9 g/dL (ref 32.0–36.0)
MCV: 96.6 fL (ref 79.3–98.0)
MONO ABS: 1 10*3/uL — AB (ref 0.1–0.9)
MONOS PCT: 22 %
NEUTROS ABS: 2.6 10*3/uL (ref 1.5–6.5)
Neutrophils Relative %: 57 %
Platelet Count: 294 10*3/uL (ref 140–400)
RBC: 2.22 MIL/uL — ABNORMAL LOW (ref 4.20–5.82)
RDW: 19.4 % — AB (ref 11.0–14.6)
WBC Count: 4.6 10*3/uL (ref 4.0–10.3)

## 2017-07-01 LAB — SAMPLE TO BLOOD BANK

## 2017-07-01 LAB — PREPARE RBC (CROSSMATCH)

## 2017-07-01 MED ORDER — DIPHENHYDRAMINE HCL 25 MG PO CAPS
25.0000 mg | ORAL_CAPSULE | Freq: Once | ORAL | Status: AC
  Administered 2017-07-01: 25 mg via ORAL

## 2017-07-01 MED ORDER — ACETAMINOPHEN 325 MG PO TABS
ORAL_TABLET | ORAL | Status: AC
Start: 2017-07-01 — End: 2017-07-01
  Filled 2017-07-01: qty 2

## 2017-07-01 MED ORDER — DARBEPOETIN ALFA 500 MCG/ML IJ SOSY
500.0000 ug | PREFILLED_SYRINGE | Freq: Once | INTRAMUSCULAR | Status: AC
  Administered 2017-07-01: 500 ug via SUBCUTANEOUS

## 2017-07-01 MED ORDER — ACETAMINOPHEN 325 MG PO TABS
650.0000 mg | ORAL_TABLET | Freq: Once | ORAL | Status: AC
  Administered 2017-07-01: 650 mg via ORAL

## 2017-07-01 MED ORDER — DARBEPOETIN ALFA 500 MCG/ML IJ SOSY
PREFILLED_SYRINGE | INTRAMUSCULAR | Status: AC
  Filled 2017-07-01: qty 1

## 2017-07-01 MED ORDER — DIPHENHYDRAMINE HCL 25 MG PO CAPS
ORAL_CAPSULE | ORAL | Status: AC
  Filled 2017-07-01: qty 1

## 2017-07-01 MED ORDER — SODIUM CHLORIDE 0.9 % IV SOLN
250.0000 mL | Freq: Once | INTRAVENOUS | Status: AC
  Administered 2017-07-01: 250 mL via INTRAVENOUS

## 2017-07-01 NOTE — Progress Notes (Signed)
Per Dr.Gudena, pt to receive 2 units of prbc today for hg 7.1. Orders placed and notified blood bank and infusion.

## 2017-07-01 NOTE — Progress Notes (Signed)
Patient Care Team: [name], MD as PCP - General  DIAGNOSIS:  Encounter Diagnoses  Name Primary?  . MDS (myelodysplastic syndrome) ([facility])   . Macrocytic anemia     CHIEF COMPLIANT: Follow-up of anemia/MDS  INTERVAL HISTORY: James Hardy is a 84 year old with above-mentioned history of chronic normocytic anemia with bone marrow findings suggestive of myelodysplastic syndrome.  He is here for follow-up visit.  He has been receiving supportive care with blood transfusions as needed.  Last transfusion was on 06/10/2017.  He is currently on Aranesp injections every 3 weeks.  He once again feels short of breath to minimal exertion and feels slightly dizzy and fatigued.  He was prescribed diuretic for his leg swelling which has improved REVIEW OF SYSTEMS:   Constitutional: Denies fevers, chills or abnormal weight loss Eyes: Denies blurriness of vision Ears, nose, mouth, throat, and face: Denies mucositis or sore throat Respiratory: Shortness of breath to exertion Cardiovascular: Denies palpitation, chest discomfort Gastrointestinal:  Denies nausea, heartburn or change in bowel habits Skin: Denies abnormal skin rashes Lymphatics: Denies new lymphadenopathy or easy bruising Neurological: Slight dizziness Behavioral/Psych: Mood is stable, no new changes  Extremities: No lower extremity edema All other systems were reviewed with the patient and are negative.  I have reviewed the past medical history, past surgical history, social history and family history with the patient and they are unchanged from previous note.  ALLERGIES:  is allergic to neosporin [neomycin-bacitracin zn-polymyx].  MEDICATIONS:  Current Outpatient Medications  Medication Sig Dispense Refill  . Calcium Citrate 250 MG TABS Take 500 mg by mouth daily.     . Cholecalciferol (VITAMIN D3) 5000 units TABS Take 5,000 Units by mouth daily.    . Coenzyme Q10 (CO Q 10) 100 MG CAPS Take 100 mg by mouth daily.    Marland Kitchen  doxazosin (CARDURA) 8 MG tablet Take 1 tablet (8 mg total) by mouth at bedtime. 30 tablet 5  . ferrous sulfate 325 (65 FE) MG tablet Take 325 mg by mouth daily with breakfast.    . finasteride (PROSCAR) 5 MG tablet Take 5 mg by mouth daily.    Marland Kitchen glucosamine-chondroitin 500-400 MG tablet Take 2 tablets by mouth daily.     Marland Kitchen MAGNESIUM-ZINC PO Take 1 tablet by mouth daily.    . Multiple Vitamin (MULTIVITAMIN) tablet Take 1 tablet by mouth daily.    . NON FORMULARY CPAP machine at bedtime    . Omega-3 Fatty Acids (FISH OIL) 1200 MG CAPS Take 1,200 mg by mouth daily.     . pantoprazole (PROTONIX) 40 MG tablet Take 1 tablet (40 mg total) by mouth daily. 90 tablet 2  . Probiotic Product (PRO-BIOTIC BLEND) CAPS Take 1 capsule by mouth daily.     . tamsulosin (FLOMAX) 0.4 MG CAPS capsule Take 0.4 mg by mouth daily.      No current facility-administered medications for this visit.     PHYSICAL EXAMINATION: ECOG PERFORMANCE STATUS: 2 - Symptomatic, <50% confined to bed  Vitals:   07/01/17 0841  BP: (!) 118/46  Pulse: 71  Resp: 16  Temp: 97.9 F (36.6 C)  SpO2: 100%   Filed Weights   07/01/17 0841  Weight: 138 lb 3.2 oz (62.7 kg)    GENERAL:alert, no distress and comfortable SKIN: skin color, texture, turgor are normal, no rashes or significant lesions EYES: normal, Conjunctiva are pink and non-injected, sclera clear OROPHARYNX:no exudate, no erythema and lips, buccal mucosa, and tongue normal  NECK: supple, thyroid  normal size, non-tender, without nodularity LYMPH:  no palpable lymphadenopathy in the cervical, axillary or inguinal LUNGS: clear to auscultation and percussion with normal breathing effort HEART: regular rate & rhythm and no murmurs and no lower extremity edema ABDOMEN:abdomen soft, non-tender and normal bowel sounds MUSCULOSKELETAL:no cyanosis of digits and no clubbing  NEURO: alert & oriented x 3 with fluent speech, no focal motor/sensory deficits EXTREMITIES: No lower  extremity edema   LABORATORY DATA:  I have reviewed the data as listed CMP Latest Ref Rng & Units 04/29/2017 12/27/2016 09/26/2016  Glucose 70 - 140 mg/dL 98 104(H) 97  BUN 7 - 26 mg/dL 28(H) 26(H) 24(H)  Creatinine 0.70 - 1.30 mg/dL 1.40(H) 1.28(H) 1.28  Sodium 136 - 145 mmol/L 139 139 138  Potassium 3.5 - 5.1 mmol/L 4.6 4.9 5.5(H)  Chloride 98 - 109 mmol/L 112(H) 109 105  CO2 22 - 29 mmol/L 21(L) 23 29  Calcium 8.4 - 10.4 mg/dL 8.3(L) 8.4(L) 8.3(L)  Total Protein 6.4 - 8.3 g/dL 5.7(L) - 5.6(L)  Total Bilirubin 0.2 - 1.2 mg/dL 0.3 - 0.4  Alkaline Phos 40 - 150 U/L 41 - 36(L)  AST 5 - 34 U/L 17 - 16  ALT 0 - 55 U/L 13 - 11    Lab Results  Component Value Date   WBC 4.6 07/01/2017   HGB 8.9 (L) 05/13/2017   HCT 21.5 (L) 07/01/2017   MCV 96.6 07/01/2017   PLT 294 07/01/2017   NEUTROABS 2.6 07/01/2017    ASSESSMENT & PLAN:  MDS (myelodysplastic syndrome) (HCC) Severe anemia required blood transfusion Bone marrow biopsy 05/13/2017: Markedly hypercellular marrow,  striking increase in megakaryocytes most of which are abnormal with hypo-lobation.  Blasts not identified, several small clusters of plasma cells 3% with kappa light chain restriction.  Iron stores abundant. Given the fact that the patient has increased monocytes in the peripheral blood, CMML is still in the differential.  Cytogenetics: 89 XY (19) 92 XY, I (17)q 10 (1) He did not have any cytogenetic abnormalities associated with MDS or AML Plan: Continue supportive care with transfusions or growth factor injections with Aranesp every 3 weeks for hemoglobin less than 10.  Lab Review 07/01/17: Hemoglobin is 7.1  I increase the dosage of Aranesp to 500 mcg subcu. He will receive 2 units of packed red cells today.    He may need a repeat bone marrow biopsy if we notice additional cytopenias. Iron studies will be ordered for the next month blood work.  Return to clinic in 1 month for follow-up with labs Orders Placed This  Encounter  Procedures  . Iron and TIBC    Standing Status:   Future    Standing Expiration Date:   07/01/2018  . Ferritin    Standing Status:   Future    Standing Expiration Date:   07/01/2018  . Reticulocytes    Standing Status:   Future    Standing Expiration Date:   07/02/2018  . CBC with Differential (Cancer Center Only)    Standing Status:   Future    Standing Expiration Date:   07/02/2018  . CMP ([facility] only)    Standing Status:   Future    Standing Expiration Date:   07/02/2018  . Sample to Blood Bank    Standing Status:   Future    Number of Occurrences:   1    Standing Expiration Date:   07/02/2018   The patient has a good understanding of the overall plan. he  agrees with it. he will call with any problems that may develop before the next visit here.   Harriette Ohara, MD 07/01/17

## 2017-07-01 NOTE — Telephone Encounter (Signed)
Gave avs and calendar ° °

## 2017-07-01 NOTE — Patient Instructions (Addendum)
Blood Transfusion, Adult A blood transfusion is a procedure in which you receive donated blood, including plasma, platelets, and red blood cells, through an IV tube. You may need a blood transfusion because of illness, surgery, or injury. The blood may come from a donor. You may also be able to donate blood for yourself (autologous blood donation) before a surgery if you know that you might require a blood transfusion. The blood given in a transfusion is made up of different types of cells. You may receive:  Red blood cells. These carry oxygen to the cells in the body.  White blood cells. These help you fight infections.  Platelets. These help your blood to clot.  Plasma. This is the liquid part of your blood and it helps with fluid imbalances.  If you have hemophilia or another clotting disorder, you may also receive other types of blood products. Tell a health care provider about:  Any allergies you have.  All medicines you are taking, including vitamins, herbs, eye drops, creams, and over-the-counter medicines.  Any problems you or family members have had with anesthetic medicines.  Any blood disorders you have.  Any surgeries you have had.  Any medical conditions you have, including any recent fever or cold symptoms.  Whether you are pregnant or may be pregnant.  Any previous reactions you have had during a blood transfusion. What are the risks? Generally, this is a safe procedure. However, problems may occur, including:  Having an allergic reaction to something in the donated blood. Hives and itching may be symptoms of this type of reaction.  Fever. This may be a reaction to the white blood cells in the transfused blood. Nausea or chest pain may accompany a fever.  Iron overload. This can happen from having many transfusions.  Transfusion-related acute lung injury (TRALI). This is a rare reaction that causes lung damage. The cause is not known.TRALI can occur within hours  of a transfusion or several days later.  Sudden (acute) or delayed hemolytic reactions. This happens if your blood does not match the cells in your transfusion. Your body's defense system (immune system) may try to attack the new cells. This complication is rare. The symptoms include fever, chills, nausea, and low back pain or chest pain.  Infection or disease transmission. This is rare.  What happens before the procedure?  You will have a blood test to determine your blood type. This is necessary to know what kind of blood your body will accept and to match it to the donor blood.  If you are going to have a planned surgery, you may be able to do an autologous blood donation. This may be done in case you need to have a transfusion.  If you have had an allergic reaction to a transfusion in the past, you may be given medicine to help prevent a reaction. This medicine may be given to you by mouth or through an IV tube.  You will have your temperature, blood pressure, and pulse monitored before the transfusion.  Follow instructions from your health care provider about eating and drinking restrictions.  Ask your health care provider about: ? Changing or stopping your regular medicines. This is especially important if you are taking diabetes medicines or blood thinners. ? Taking medicines such as aspirin and ibuprofen. These medicines can thin your blood. Do not take these medicines before your procedure if your health care provider instructs you not to. What happens during the procedure?  An IV tube will   be inserted into one of your veins.  The bag of donated blood will be attached to your IV tube. The blood will then enter through your vein.  Your temperature, blood pressure, and pulse will be monitored regularly during the transfusion. This monitoring is done to detect early signs of a transfusion reaction.  If you have any signs or symptoms of a reaction, your transfusion will be stopped  and you may be given medicine.  When the transfusion is complete, your IV tube will be removed.  Pressure may be applied to the IV site for a few minutes.  A bandage (dressing) will be applied. The procedure may vary among health care providers and hospitals. What happens after the procedure?  Your temperature, blood pressure, heart rate, breathing rate, and blood oxygen level will be monitored often.  Your blood may be tested to see how you are responding to the transfusion.  You may be warmed with fluids or blankets to maintain a normal body temperature. Summary  A blood transfusion is a procedure in which you receive donated blood, including plasma, platelets, and red blood cells, through an IV tube.  Your temperature, blood pressure, and pulse will be monitored before, during, and after the transfusion.  Your blood may be tested after the transfusion to see how your body has responded. This information is not intended to replace advice given to you by your health care provider. Make sure you discuss any questions you have with your health care provider. Document Released: 03/08/2000 Document Revised: 12/07/2015 Document Reviewed: 12/07/2015 Elsevier Interactive Patient Education  2018 [employer].  Darbepoetin Alfa injection What is this medicine? DARBEPOETIN ALFA (dar be POE e tin AL fa) helps your body make more red blood cells. It is used to treat anemia caused by chronic kidney failure and chemotherapy. This medicine may be used for other purposes; ask your health care provider or pharmacist if you have questions. COMMON BRAND NAME(S): Aranesp What should I tell my health care provider before I take this medicine? They need to know if you have any of these conditions: -blood clotting disorders or history of blood clots -cancer patient not on chemotherapy -cystic fibrosis -heart disease, such as angina, heart failure, or a history of a heart attack -hemoglobin level of  12 g/dL or greater -high blood pressure -low levels of folate, iron, or vitamin B12 -seizures -an unusual or allergic reaction to darbepoetin, erythropoietin, albumin, hamster proteins, latex, other medicines, foods, dyes, or preservatives -pregnant or trying to get pregnant -breast-feeding How should I use this medicine? This medicine is for injection into a vein or under the skin. It is usually given by a health care professional in a hospital or clinic setting. If you get this medicine at home, you will be taught how to prepare and give this medicine. Use exactly as directed. Take your medicine at regular intervals. Do not take your medicine more often than directed. It is important that you put your used needles and syringes in a special sharps container. Do not put them in a trash can. If you do not have a sharps container, call your pharmacist or healthcare provider to get one. A special MedGuide will be given to you by the pharmacist with each prescription and refill. Be sure to read this information carefully each time. Talk to your pediatrician regarding the use of this medicine in children. While this medicine may be used in children as young as 1 year for selected conditions, precautions do apply.  Overdosage: If you think you have taken too much of this medicine contact a poison control center or emergency room at once. NOTE: This medicine is only for you. Do not share this medicine with others. What if I miss a dose? If you miss a dose, take it as soon as you can. If it is almost time for your next dose, take only that dose. Do not take double or extra doses. What may interact with this medicine? Do not take this medicine with any of the following medications: -epoetin alfa This list may not describe all possible interactions. Give your health care provider a list of all the medicines, herbs, non-prescription drugs, or dietary supplements you use. Also tell them if you smoke, drink  alcohol, or use illegal drugs. Some items may interact with your medicine. What should I watch for while using this medicine? Your condition will be monitored carefully while you are receiving this medicine. You may need blood work done while you are taking this medicine. What side effects may I notice from receiving this medicine? Side effects that you should report to your doctor or health care professional as soon as possible: -allergic reactions like skin rash, itching or hives, swelling of the face, lips, or tongue -breathing problems -changes in vision -chest pain -confusion, trouble speaking or understanding -feeling faint or lightheaded, falls -high blood pressure -muscle aches or pains -pain, swelling, warmth in the leg -rapid weight gain -severe headaches -sudden numbness or weakness of the face, arm or leg -trouble walking, dizziness, loss of balance or coordination -seizures (convulsions) -swelling of the ankles, feet, hands -unusually weak or tired Side effects that usually do not require medical attention (report to your doctor or health care professional if they continue or are bothersome): -diarrhea -fever, chills (flu-like symptoms) -headaches -nausea, vomiting -redness, stinging, or swelling at site where injected This list may not describe all possible side effects. Call your doctor for medical advice about side effects. You may report side effects to FDA at 1-800-FDA-1088. Where should I keep my medicine? Keep out of the reach of children. Store in a refrigerator between 2 and 8 degrees C (36 and 46 degrees F). Do not freeze. Do not shake. Throw away any unused portion if using a single-dose vial. Throw away any unused medicine after the expiration date. NOTE: This sheet is a summary. It may not cover all possible information. If you have questions about this medicine, talk to your doctor, pharmacist, or health care provider.  2018 Elsevier/Gold Standard  (2015-10-30 19:52:26)

## 2017-07-02 LAB — BPAM RBC
BLOOD PRODUCT EXPIRATION DATE: 201904292359
Blood Product Expiration Date: 201904302359
ISSUE DATE / TIME: 201904090934
ISSUE DATE / TIME: 201904090934
UNIT TYPE AND RH: 6200
Unit Type and Rh: 6200

## 2017-07-02 LAB — TYPE AND SCREEN
ABO/RH(D): A POS
Antibody Screen: NEGATIVE
Unit division: 0
Unit division: 0

## 2017-07-04 ENCOUNTER — Ambulatory Visit: Payer: Self-pay | Admitting: *Deleted

## 2017-07-04 NOTE — Telephone Encounter (Signed)
Pt calling stating that his wife noticed a tick on the lower part of his right ear today.Tick was removed from ear around 11am today. Tick was noted to be about the sized of a finish nail head according to pt with a white spot. Pt denies having any symptoms at this time. Pt states he does not remember when he had his tetanus shot last but noted to be 03/2015 in chart. Pt states he had a history of [facility] fever in the past and just wanted to make Dr. [name] aware. Pt advised to to call office if symptoms develop. Pt verbalized understanding.  Reason for Disposition . Tick bite with no complications  Answer Assessment - Initial Assessment Questions 1. TYPE of TICK: "Is it a wood tick or a deer tick?" If unsure, ask: "What size was the tick?" "Did it look more like a watermelon seed or a poppy seed?"      Big one with white spot about like poppy seed 2. LOCATION: "Where is the tick bite located?"     On the lower part of right ear 3. ONSET: "How long do you think the tick was attached before you removed it?" (Hours or days)      11am 4. TETANUS: "When was the last tetanus booster?"      Does not remember when last tetanus shot was  Protocols used: TICK BITE-A-AH

## 2017-07-21 ENCOUNTER — Telehealth: Payer: Self-pay

## 2017-07-21 ENCOUNTER — Other Ambulatory Visit: Payer: Self-pay

## 2017-07-21 DIAGNOSIS — D539 Nutritional anemia, unspecified: Secondary | ICD-10-CM

## 2017-07-21 NOTE — Telephone Encounter (Signed)
Returned pt call regarding tick bites and it possibly being related to his anemia. He is concerned with Babesia. Informed him we do not believe that he has this due to the severe side effects typically associated but it doesn't hurt to rule it out and we have added the lab for Wednesday.  Cyndia Bent RN

## 2017-07-23 ENCOUNTER — Inpatient Hospital Stay (HOSPITAL_BASED_OUTPATIENT_CLINIC_OR_DEPARTMENT_OTHER): Payer: Medicare Other | Admitting: Adult Health

## 2017-07-23 ENCOUNTER — Encounter: Payer: Self-pay | Admitting: Adult Health

## 2017-07-23 ENCOUNTER — Telehealth: Payer: Self-pay | Admitting: Gastroenterology

## 2017-07-23 ENCOUNTER — Telehealth: Payer: Self-pay | Admitting: Adult Health

## 2017-07-23 ENCOUNTER — Inpatient Hospital Stay: Payer: Medicare Other | Attending: Hematology and Oncology

## 2017-07-23 ENCOUNTER — Inpatient Hospital Stay: Payer: Medicare Other

## 2017-07-23 VITALS — BP 141/40 | HR 98 | Temp 97.8°F | Resp 18

## 2017-07-23 VITALS — BP 124/45 | HR 68 | Temp 97.8°F | Resp 20 | Ht 62.0 in | Wt 136.8 lb

## 2017-07-23 DIAGNOSIS — D539 Nutritional anemia, unspecified: Secondary | ICD-10-CM | POA: Diagnosis not present

## 2017-07-23 DIAGNOSIS — D464 Refractory anemia, unspecified: Secondary | ICD-10-CM | POA: Diagnosis not present

## 2017-07-23 DIAGNOSIS — D469 Myelodysplastic syndrome, unspecified: Secondary | ICD-10-CM | POA: Insufficient documentation

## 2017-07-23 DIAGNOSIS — G4730 Sleep apnea, unspecified: Secondary | ICD-10-CM | POA: Diagnosis not present

## 2017-07-23 DIAGNOSIS — R195 Other fecal abnormalities: Secondary | ICD-10-CM | POA: Diagnosis not present

## 2017-07-23 DIAGNOSIS — K219 Gastro-esophageal reflux disease without esophagitis: Secondary | ICD-10-CM | POA: Insufficient documentation

## 2017-07-23 DIAGNOSIS — N400 Enlarged prostate without lower urinary tract symptoms: Secondary | ICD-10-CM | POA: Insufficient documentation

## 2017-07-23 DIAGNOSIS — E785 Hyperlipidemia, unspecified: Secondary | ICD-10-CM | POA: Diagnosis not present

## 2017-07-23 LAB — IRON AND TIBC
Iron: 139 ug/dL (ref 42–163)
Saturation Ratios: 96 % (ref 42–163)
TIBC: 145 ug/dL — AB (ref 202–409)
UIBC: 6 ug/dL

## 2017-07-23 LAB — CBC WITH DIFFERENTIAL (CANCER CENTER ONLY)
BASOS ABS: 0.1 10*3/uL (ref 0.0–0.1)
Basophils Relative: 1 %
EOS ABS: 0 10*3/uL (ref 0.0–0.5)
EOS PCT: 1 %
HCT: 22.1 % — ABNORMAL LOW (ref 38.4–49.9)
Hemoglobin: 7.1 g/dL — ABNORMAL LOW (ref 13.0–17.1)
Lymphocytes Relative: 13 %
Lymphs Abs: 0.7 10*3/uL — ABNORMAL LOW (ref 0.9–3.3)
MCH: 30.9 pg (ref 27.2–33.4)
MCHC: 32.1 g/dL (ref 32.0–36.0)
MCV: 96.1 fL (ref 79.3–98.0)
MONO ABS: 1.4 10*3/uL — AB (ref 0.1–0.9)
MONOS PCT: 24 %
Neutro Abs: 3.6 10*3/uL (ref 1.5–6.5)
Neutrophils Relative %: 61 %
PLATELETS: 226 10*3/uL (ref 140–400)
RBC: 2.3 MIL/uL — ABNORMAL LOW (ref 4.20–5.82)
RDW: 18.4 % — AB (ref 11.0–14.6)
WBC Count: 5.8 10*3/uL (ref 4.0–10.3)

## 2017-07-23 LAB — CMP (CANCER CENTER ONLY)
ALT: 11 U/L (ref 0–55)
ANION GAP: 7 (ref 3–11)
AST: 16 U/L (ref 5–34)
Albumin: 3.6 g/dL (ref 3.5–5.0)
Alkaline Phosphatase: 37 U/L — ABNORMAL LOW (ref 40–150)
BILIRUBIN TOTAL: 0.4 mg/dL (ref 0.2–1.2)
BUN: 42 mg/dL — ABNORMAL HIGH (ref 7–26)
CHLORIDE: 111 mmol/L — AB (ref 98–109)
CO2: 22 mmol/L (ref 22–29)
Calcium: 8.6 mg/dL (ref 8.4–10.4)
Creatinine: 1.47 mg/dL — ABNORMAL HIGH (ref 0.70–1.30)
GFR, Est AFR Am: 48 mL/min — ABNORMAL LOW (ref >60)
GFR, Estimated: 41 mL/min — ABNORMAL LOW (ref >60)
GLUCOSE: 93 mg/dL (ref 70–140)
POTASSIUM: 4.4 mmol/L (ref 3.5–5.1)
SODIUM: 140 mmol/L (ref 136–145)
TOTAL PROTEIN: 5.8 g/dL — AB (ref 6.4–8.3)

## 2017-07-23 LAB — SAMPLE TO BLOOD BANK

## 2017-07-23 LAB — PREPARE RBC (CROSSMATCH)

## 2017-07-23 LAB — FERRITIN: FERRITIN: 1450 ng/mL — AB (ref 22–316)

## 2017-07-23 MED ORDER — ACETAMINOPHEN 325 MG PO TABS
650.0000 mg | ORAL_TABLET | Freq: Once | ORAL | Status: AC
  Administered 2017-07-23: 650 mg via ORAL

## 2017-07-23 MED ORDER — SODIUM CHLORIDE 0.9% FLUSH
3.0000 mL | INTRAVENOUS | Status: DC | PRN
  Filled 2017-07-23: qty 10

## 2017-07-23 MED ORDER — FUROSEMIDE 10 MG/ML IJ SOLN
INTRAMUSCULAR | Status: AC
  Filled 2017-07-23: qty 2

## 2017-07-23 MED ORDER — DIPHENHYDRAMINE HCL 25 MG PO CAPS
ORAL_CAPSULE | ORAL | Status: AC
  Filled 2017-07-23: qty 1

## 2017-07-23 MED ORDER — DARBEPOETIN ALFA 500 MCG/ML IJ SOSY
PREFILLED_SYRINGE | INTRAMUSCULAR | Status: AC
Start: 2017-07-23 — End: 2017-07-23
  Filled 2017-07-23: qty 1

## 2017-07-23 MED ORDER — DARBEPOETIN ALFA 300 MCG/0.6ML IJ SOSY
PREFILLED_SYRINGE | INTRAMUSCULAR | Status: AC
Start: 2017-07-23 — End: 2017-07-23
  Filled 2017-07-23: qty 0.6

## 2017-07-23 MED ORDER — DIPHENHYDRAMINE HCL 25 MG PO CAPS
25.0000 mg | ORAL_CAPSULE | Freq: Once | ORAL | Status: AC
  Administered 2017-07-23: 25 mg via ORAL

## 2017-07-23 MED ORDER — FUROSEMIDE 10 MG/ML IJ SOLN
10.0000 mg | Freq: Once | INTRAMUSCULAR | Status: AC
  Administered 2017-07-23: 10 mg via INTRAVENOUS

## 2017-07-23 MED ORDER — DARBEPOETIN ALFA 500 MCG/ML IJ SOSY
500.0000 ug | PREFILLED_SYRINGE | Freq: Once | INTRAMUSCULAR | Status: AC
  Administered 2017-07-23: 500 ug via SUBCUTANEOUS

## 2017-07-23 MED ORDER — ACETAMINOPHEN 325 MG PO TABS
ORAL_TABLET | ORAL | Status: AC
  Filled 2017-07-23: qty 2

## 2017-07-23 NOTE — Telephone Encounter (Signed)
Next available APP or open MD appt.  He is getting a transfusion today according to notes. Sooner rather than later appt please

## 2017-07-23 NOTE — Progress Notes (Signed)
[facility] Follow up:    James Post, MD [place] [id number]   DIAGNOSIS: Cancer Staging No matching staging information was found for the patient.  SUMMARY OF ONCOLOGIC HISTORY:  Bone marrow biopsy 05/13/2017: Markedly hypercellular marrow,  striking increase in megakaryocytes most of which are abnormal with hypo-lobation. Blasts not identified, several small clusters of plasma cells 3% with kappa light chain restriction. Iron stores abundant. Given the fact that the patient has increased monocytes in the peripheral blood, CMML is still in the differential.  Cytogenetics: 41 XY (19) 39 XY, I (17)q 10 (1) He did not have any cytogenetic abnormalities associated with MDS or AML  Supportive Transfusions  Aranesp started on 06/10/17 (currently at 5102mg every 3 weeks)  CURRENT THERAPY: Blood transfusions, Aranesp  INTERVAL HISTORY: James Hardy returns for evaluation of his anemia.  He is doing moderately well.  He was last here on 07/01/2017 and received 2 units of blood and Aranesp.  He tolerated both of those well.  He notes that he felts better for about 4-5 days, then felt tired again.  He is symptomatic with his anemia, noting shortness of breath with activity, and tiring easier than usual.  He notes that his stool is dark about every other day.     Patient Active Problem List   Diagnosis Date Noted  . MDS (myelodysplastic syndrome) ([facility] 06/03/2017  . S/P inguinal hernia repair 01/01/2017  . Macrocytic anemia 04/16/2016  . Lung nodule seen on imaging study 08/18/2015  . Severe aortic stenosis   . Aortic stenosis   . Angina pectoris ([facility]   . Hyperlipidemia 04/11/2009  . GERD 12/02/2008  . Sleep apnea 08/21/2007  . BPH (benign prostatic hypertrophy) 08/21/2007    is allergic to neosporin [neomycin-bacitracin zn-polymyx].  MEDICAL HISTORY: Past Medical History:  Diagnosis Date  . ANGULAR CHEILITIS  04/11/2009   Qualifier: Diagnosis of  By: TJoyce Gross   . APHTHOUS ULCERS 12/19/2009   Qualifier: Diagnosis of  By: BElease HashimotoMD, Bruce    . BENIGN PROSTATIC HYPERTROPHY, HX OF 08/21/2007   Qualifier: Diagnosis of  By: MJerral Ralph   . Cancer ([facility]    left leg squamous cell removed  . CERUMEN IMPACTION 04/11/2009   Qualifier: Diagnosis of  By: TJoyce Gross   . COLITIS, HX OF 08/21/2007   Qualifier: Diagnosis of  By: MJerral Ralph   . COLONIC POLYPS, HX OF 12/02/2008   Qualifier: Diagnosis of  By: BElease HashimotoMD, Bruce    . DIVERTICULOSIS, COLON 08/28/2006   Qualifier: Diagnosis of  By: MJerral Ralph   . GERD 12/02/2008   Qualifier: Diagnosis of  By: BElease HashimotoMD, Bruce    . [facility]   . HYPERLIPIDEMIA 04/11/2009   Qualifier: Diagnosis of  By: TJoyce Gross   . INTERNAL HEMORRHOIDS 08/28/2006   Qualifier: Diagnosis of  By: MJerral Ralph   . Nocturia   . OSTEOPOROSIS 08/21/2007   Qualifier: Diagnosis of  By: MJerral Ralph   . Shortness of breath dyspnea    with exertion  . SLEEP APNEA 08/21/2007   Qualifier: Diagnosis of  By: MJerral Ralph     SURGICAL HISTORY: Past Surgical History:  Procedure Laterality Date  . CARDIAC CATHETERIZATION N/A 07/25/2015   Procedure: Right/Left Heart Cath and Coronary Angiography;  Surgeon: MSherren Mocha MD;  [facility];  Service: Cardiovascular;  Laterality: N/A;  . COLONOSCOPY    . EYE SURGERY    . [place]  2001  . INGUINAL HERNIA REPAIR Left 01/01/2017   Procedure: LAPAROSCOPIC REPAIR LEFT INGUINAL HERNIA WITH MESH;  Surgeon: [name], MD;  Location: [facility];  Service: General;  Laterality: Left;  . INSERTION OF MESH Left 01/01/2017   Procedure: INSERTION OF MESH;  Surgeon: [name], MD;  Location: [facility];  Service: General;  Laterality: Left;  . SKIN CANCER EXCISION     left leg  . TEE WITHOUT CARDIOVERSION N/A 08/08/2015    Procedure: TRANSESOPHAGEAL ECHOCARDIOGRAM (TEE);  Surgeon: Sherren Mocha, MD;  Location: [facility];  Service: Open Heart Surgery;  Laterality: N/A;  . TRANSCATHETER AORTIC VALVE REPLACEMENT, TRANSFEMORAL N/A 08/08/2015   Procedure: TRANSCATHETER AORTIC VALVE REPLACEMENT, TRANSFEMORAL;  Surgeon: Sherren Mocha, MD;  Location: [facility];  Service: Open Heart Surgery;  Laterality: N/A;    SOCIAL HISTORY: Social History   Socioeconomic History  . Marital status: Married    Spouse name: Not on file  . Number of children: Not on file  . Years of education: Not on file  . Highest education level: Not on file  Occupational History  . Not on file  Social Needs  . Financial resource strain: Not on file  . Food insecurity:    Worry: Not on file    Inability: Not on file  . Transportation needs:    Medical: Not on file    Non-medical: Not on file  Tobacco Use  . Smoking status: Never Smoker  . Smokeless tobacco: Never Used  Substance and Sexual Activity  . Alcohol use: No    Alcohol/week: 0.0 oz  . Drug use: No  . Sexual activity: Yes  Lifestyle  . Physical activity:    Days per week: Not on file    Minutes per session: Not on file  . Stress: Not on file  Relationships  . Social connections:    Talks on phone: Not on file    Gets together: Not on file    Attends religious service: Not on file    Active member of club or organization: Not on file    Attends meetings of clubs or organizations: Not on file    Relationship status: Not on file  . Intimate partner violence:    Fear of current or ex partner: Not on file    Emotionally abused: Not on file    Physically abused: Not on file    Forced sexual activity: Not on file  Other Topics Concern  . Not on file  Social History Narrative  . Not on file    FAMILY HISTORY: Family History  Problem Relation Age of Onset  . Asthma Mother   . Heart disease Other     Review of Systems  Constitutional: Positive for fatigue. Negative for  appetite change, chills, fever and unexpected weight change.       Denies night sweats, denies generalized aches/pains  HENT:   Negative for hearing loss, lump/mass, sore throat and trouble swallowing.   Eyes: Negative for eye problems and icterus.  Respiratory: Positive for shortness of breath (with exertion). Negative for chest tightness and cough.   Cardiovascular: Negative for chest pain, leg swelling and palpitations.  Gastrointestinal: Negative for abdominal distention, abdominal pain, constipation, diarrhea, nausea and vomiting.  Endocrine: Negative for hot flashes.  Musculoskeletal: Negative for arthralgias.  Skin: Negative for itching and rash.  Neurological: Negative for dizziness, extremity weakness and headaches.  Psychiatric/Behavioral: Negative for depression. The patient is not nervous/anxious.       PHYSICAL EXAMINATION  ECOG PERFORMANCE STATUS: 2 - Symptomatic, <50% confined to bed  Vitals:   07/23/17 0904  BP: (!) 124/45  Pulse: 68  Resp: 20  Temp: 97.8 F (36.6 C)  SpO2: 100%    Physical Exam  Constitutional: He is oriented to person, place, and time. He appears well-developed and well-nourished.  HENT:  Head: Normocephalic and atraumatic.  Mouth/Throat: Oropharynx is clear and moist. No oropharyngeal exudate.  Eyes: Pupils are equal, round, and reactive to light. No scleral icterus.  Neck: Neck supple.  Cardiovascular: Normal rate, regular rhythm and normal heart sounds.  Pulmonary/Chest: Effort normal and breath sounds normal.  Abdominal: Soft. Bowel sounds are normal. He exhibits no distension and no mass. There is no tenderness. There is no rebound and no guarding.  Musculoskeletal: He exhibits no edema.  Lymphadenopathy:    He has no cervical adenopathy.  Neurological: He is alert and oriented to person, place, and time.  Skin: Skin is warm and dry. No rash noted.  Psychiatric: He has a normal mood and affect.    LABORATORY DATA:  CBC     Component Value Date/Time   WBC 5.8 07/23/2017 0821   WBC 6.3 05/13/2017 0935   RBC 2.30 (L) 07/23/2017 0821   HGB 7.1 (L) 07/23/2017 0821   HCT 22.1 (L) 07/23/2017 0821   PLT 226 07/23/2017 0821   MCV 96.1 07/23/2017 0821   MCH 30.9 07/23/2017 0821   MCHC 32.1 07/23/2017 0821   RDW 18.4 (H) 07/23/2017 0821   LYMPHSABS 0.7 (L) 07/23/2017 0821   MONOABS 1.4 (H) 07/23/2017 0821   EOSABS 0.0 07/23/2017 0821   BASOSABS 0.1 07/23/2017 0821    CMP     Component Value Date/Time   NA 140 07/23/2017 0821   K 4.4 07/23/2017 0821   CL 111 (H) 07/23/2017 0821   CO2 22 07/23/2017 0821   GLUCOSE 93 07/23/2017 0821   BUN 42 (H) 07/23/2017 0821   CREATININE 1.47 (H) 07/23/2017 0821   CREATININE 1.10 07/21/2015 1234   CALCIUM 8.6 07/23/2017 0821   PROT 5.8 (L) 07/23/2017 0821   ALBUMIN 3.6 07/23/2017 0821   AST 16 07/23/2017 0821   ALT 11 07/23/2017 0821   ALKPHOS 37 (L) 07/23/2017 0821   BILITOT 0.4 07/23/2017 0821   GFRNONAA 41 (L) 07/23/2017 0821   GFRAA 48 (L) 07/23/2017 0821         ASSESSMENT and THERAPY PLAN:   MDS (myelodysplastic syndrome) (HCC) Severe anemia required blood transfusion Bone marrow biopsy 05/13/2017: Markedly hypercellular marrow, cellularity 9200%, striking increase in megakaryocytes most of which are abnormal with hypo-lobation.  Blasts not identified, several small clusters of plasma cells 3% with kappa light chain restriction.  Iron stores abundant, Given the fact that the patient has increased monocytes in the peripheral blood, CMML is still in the differential.  Cytogenetics: 65 XY (19) 46 XY, I (17)q 10 (1) He did not have any cytogenetic abnormalities associated with MDS or AML Plan: Continue supportive care with transfusions or growth factor injections as needed.  He will receive two units of blood today. He will also receive stool cards.  I reviewed his levels with Dr. Lindi Adie, who agrees.  Will also refer to gastroenterology.  His family asked  if he should get a second opinion.  I let him know that second opinions are certainly welcome and we would be happy to accommodate them.  I suggested they wait until he has a GI evaluation first.  Jedediah will return in 2 weeks for labs, f/u, and a blood transfusion.     Orders Placed This Encounter  Procedures  . Miscellaneous [facility] test (send-out)  . Ambulatory referral to Gastroenterology    Referral Priority:   Urgent    Referral Type:   Consultation    Referral Reason:   Specialty Services Required    Number of Visits Requested:   1  . Sample to Blood Bank    Standing Status:   Standing    Number of Occurrences:   52    Standing Expiration Date:   07/24/2018    All questions were answered. The patient knows to call the clinic with any problems, questions or concerns. We can certainly see the patient much sooner if necessary.  A total of (30) minutes of face-to-face time was spent with this patient with greater than 50% of that time in counseling and care-coordination.  This note was electronically signed. Scot Dock, NP 07/23/2017

## 2017-07-23 NOTE — Telephone Encounter (Signed)
Gave patient AVs and calendar of upcoming may appointments.  °

## 2017-07-23 NOTE — Assessment & Plan Note (Addendum)
Severe anemia required blood transfusion Bone marrow biopsy 05/13/2017: Markedly hypercellular marrow, cellularity 9200%, striking increase in megakaryocytes most of which are abnormal with hypo-lobation.  Blasts not identified, several small clusters of plasma cells 3% with kappa light chain restriction.  Iron stores abundant, Given the fact that the patient has increased monocytes in the peripheral blood, CMML is still in the differential.  Cytogenetics: 1 XY (19) 46 XY, I (17)q 10 (1) He did not have any cytogenetic abnormalities associated with MDS or AML Plan: Continue supportive care with transfusions or growth factor injections as needed.  He will receive two units of blood today. He will also receive stool cards.  I reviewed his levels with Dr. Lindi Adie, who agrees.  Will also refer to gastroenterology.  His family asked if he should get a second opinion.  I let him know that second opinions are certainly welcome and we would be happy to accommodate them.  I suggested they wait until he has a GI evaluation first.  Nicklas will return in 2 weeks for labs, f/u, and a blood transfusion.

## 2017-07-23 NOTE — Patient Instructions (Signed)
Anemia Anemia is a condition in which you do not have enough red blood cells or hemoglobin. Hemoglobin is a substance in red blood cells that carries oxygen. When you do not have enough red blood cells or hemoglobin (are anemic), your body cannot get enough oxygen and your organs may not work properly. As a result, you may feel very tired or have other problems. What are the causes? Common causes of anemia include:  Excessive bleeding. Anemia can be caused by excessive bleeding inside or outside the body, including bleeding from the intestine or from periods in women.  Poor nutrition.  Long-lasting (chronic) kidney, thyroid, and liver disease.  Bone marrow disorders.  Cancer and treatments for cancer.  HIV (human immunodeficiency virus) and AIDS (acquired immunodeficiency syndrome).  Treatments for HIV and AIDS.  Spleen problems.  Blood disorders.  Infections, medicines, and autoimmune disorders that destroy red blood cells.  What are the signs or symptoms? Symptoms of this condition include:  Minor weakness.  Dizziness.  Headache.  Feeling heartbeats that are irregular or faster than normal (palpitations).  Shortness of breath, especially with exercise.  Paleness.  Cold sensitivity.  Indigestion.  Nausea.  Difficulty sleeping.  Difficulty concentrating.  Symptoms may occur suddenly or develop slowly. If your anemia is mild, you may not have symptoms. How is this diagnosed? This condition is diagnosed based on:  Blood tests.  Your medical history.  A physical exam.  Bone marrow biopsy.  Your health care provider may also check your stool (feces) for blood and may do additional testing to look for the cause of your bleeding. You may also have other tests, including:  Imaging tests, such as a CT scan or MRI.  Endoscopy.  Colonoscopy.  How is this treated? Treatment for this condition depends on the cause. If you continue to lose a lot of blood,  you may need to be treated at a hospital. Treatment may include:  Taking supplements of iron, vitamin B12, or folic acid.  Taking a hormone medicine (erythropoietin) that can help to stimulate red blood cell growth.  Having a blood transfusion. This may be needed if you lose a lot of blood.  Making changes to your diet.  Having surgery to remove your spleen.  Follow these instructions at home:  Take over-the-counter and prescription medicines only as told by your health care provider.  Take supplements only as told by your health care provider.  Follow any diet instructions that you were given.  Keep all follow-up visits as told by your health care provider. This is important. Contact a health care provider if:  You develop new bleeding anywhere in the body. Get help right away if:  You are very weak.  You are short of breath.  You have pain in your abdomen or chest.  You are dizzy or feel faint.  You have trouble concentrating.  You have bloody or black, tarry stools.  You vomit repeatedly or you vomit up blood. Summary  Anemia is a condition in which you do not have enough red blood cells or enough of a substance in your red blood cells that carries oxygen (hemoglobin).  Symptoms may occur suddenly or develop slowly.  If your anemia is mild, you may not have symptoms.  This condition is diagnosed with blood tests as well as a medical history and physical exam. Other tests may be needed.  Treatment for this condition depends on the cause of the anemia. This information is not intended to replace advice   given to you by your health care provider. Make sure you discuss any questions you have with your health care provider. Document Released: 04/18/2004 Document Revised: 04/12/2016 Document Reviewed: 04/12/2016 Elsevier Interactive Patient Education  [employer].

## 2017-07-23 NOTE — Telephone Encounter (Signed)
Left message for patient to return my call.

## 2017-07-23 NOTE — Patient Instructions (Signed)
Blood Transfusion, Adult, Care After This sheet gives you information about how to care for yourself after your procedure. Your health care provider may also give you more specific instructions. If you have problems or questions, contact your health care provider. What can I expect after the procedure? After your procedure, it is common to have:  Bruising and soreness where the IV tube was inserted.  Headache.  Follow these instructions at home:  Take over-the-counter and prescription medicines only as told by your health care provider.  Return to your normal activities as told by your health care provider.  Follow instructions from your health care provider about how to take care of your IV insertion site. Make sure you: ? Wash your hands with soap and water before you change your bandage (dressing). If soap and water are not available, use hand sanitizer. ? Change your dressing as told by your health care provider.  Check your IV insertion site every day for signs of infection. Check for: ? More redness, swelling, or pain. ? More fluid or blood. ? Warmth. ? Pus or a bad smell. Contact a health care provider if:  You have more redness, swelling, or pain around the IV insertion site.  You have more fluid or blood coming from the IV insertion site.  Your IV insertion site feels warm to the touch.  You have pus or a bad smell coming from the IV insertion site.  Your urine turns pink, red, or brown.  You feel weak after doing your normal activities. Get help right away if:  You have signs of a serious allergic or immune system reaction, including: ? Itchiness. ? Hives. ? Trouble breathing. ? Anxiety. ? Chest or lower back pain. ? Fever, flushing, and chills. ? Rapid pulse. ? Rash. ? Diarrhea. ? Vomiting. ? Dark urine. ? Serious headache. ? Dizziness. ? Stiff neck. ? Yellow coloration of the face or the white parts of the eyes (jaundice). This information is not  intended to replace advice given to you by your health care provider. Make sure you discuss any questions you have with your health care provider. Document Released: 04/01/2014 Document Revised: 11/08/2015 Document Reviewed: 09/25/2015 Elsevier Interactive Patient Education  2018 Elsevier Inc.  

## 2017-07-24 ENCOUNTER — Other Ambulatory Visit: Payer: Self-pay | Admitting: Adult Health

## 2017-07-24 DIAGNOSIS — Z9889 Other specified postprocedural states: Secondary | ICD-10-CM

## 2017-07-24 DIAGNOSIS — K219 Gastro-esophageal reflux disease without esophagitis: Secondary | ICD-10-CM

## 2017-07-24 DIAGNOSIS — Z8719 Personal history of other diseases of the digestive system: Secondary | ICD-10-CM

## 2017-07-24 DIAGNOSIS — T161XXA Foreign body in right ear, initial encounter: Secondary | ICD-10-CM | POA: Diagnosis not present

## 2017-07-24 LAB — BPAM RBC
BLOOD PRODUCT EXPIRATION DATE: 201905212359
Blood Product Expiration Date: 201905202359
ISSUE DATE / TIME: 201905011106
ISSUE DATE / TIME: 201905011106
UNIT TYPE AND RH: 6200
UNIT TYPE AND RH: 6200

## 2017-07-24 LAB — TYPE AND SCREEN
ABO/RH(D): A POS
Antibody Screen: NEGATIVE
Unit division: 0
Unit division: 0

## 2017-07-24 LAB — MISC LABCORP TEST (SEND OUT): Labcorp test code: 138315

## 2017-07-25 ENCOUNTER — Encounter: Payer: Self-pay | Admitting: Physician Assistant

## 2017-07-25 ENCOUNTER — Ambulatory Visit (INDEPENDENT_AMBULATORY_CARE_PROVIDER_SITE_OTHER): Payer: Medicare Other | Admitting: Physician Assistant

## 2017-07-25 VITALS — BP 106/52 | HR 68 | Ht 62.0 in | Wt 137.4 lb

## 2017-07-25 DIAGNOSIS — D619 Aplastic anemia, unspecified: Secondary | ICD-10-CM

## 2017-07-25 DIAGNOSIS — R195 Other fecal abnormalities: Secondary | ICD-10-CM

## 2017-07-25 NOTE — Progress Notes (Signed)
Subjective:    [name], male    DOB: February 20, 1929, 84 y.o.   MRN: [id number]  HPI [name] is a pleasant 84 year old white male, known remotely to Dr. Fuller Plan from colonoscopy done in 2008 who is referred today by Dr. Vennie Homans for evaluation of anemia and intermittent dark stool by patient report. Patient has history of severe aortic stenosis, he is status post aortic valve replacement, has history of GERD, remote history of lymphocytic colitis, and has recently been diagnosed with a myelodysplastic syndrome. He had bone marrow biopsy done earlier this year which showed a markedly hypercellular marrow, with striking increase in megakaryocytes most of which are abnormal with hypo-lobe patient. Clapp no blasts identified, iron stores were abundant. Given the fact that the patient had increased mono sites, and the peripheral blood, hematology fill CMML is still in the differential. He has been requiring fairly frequent transfusions. He just had blood transfusion 1 earlier this week. He is also been started on Aranesp as of mid-March every 3 weeks. Most recent labs done on 07/23/2017 showed ferritin of 1450, serum iron 139, TIBC 145 iron saturation of 96. Hemoglobin 7.1, hematocrit of 22.1, MCV of 96, platelets 226 and WBC of 5.8.   Patient was given recent Hemoccults which she has not completed as yet.  Patient has no current GI complaints. He says he takes Protonix for GERD, he has no complaints of heartburn or indigestion, no dysphagia or odynophagia. He denies any abdominal pain or nausea. No changes in bowel habits. He does feel that his stools have been a bit dark off and on. He says he really only started paying attention within the past month or so. He has not noticed any blood. He has been on oral iron therapy over the past couple of years.  Last colonoscopy in 2008 was done for anemia and complaints of diarrhea. He had sigmoid diverticulosis, internal hemorrhoids and no  polyps. These were taken because of complaint of diarrhea in this showed a chronic microscopic colitis consistent with lymphocytic colitis. He does not recall being treated with any medication at that time. EGD in 1994 per Dr. Fuller Plan showed distal erosive esophagitis, antral gastritis and pyloric deformity. Biopsies showed chronic gastritis no H. pylori. Again he is been maintained on chronic PPI therapy. Takes baby aspirin daily, no NSAID use  Review of Systems Pertinent positive and negative review of systems were noted in the above HPI section.  All other review of systems was otherwise negative.  Outpatient Encounter Medications as of 07/25/2017  Medication Sig  . Calcium Citrate 250 MG TABS Take 500 mg by mouth daily.   . Cholecalciferol (VITAMIN D3) 5000 units TABS Take 5,000 Units by mouth daily.  . Coenzyme Q10 (CO Q 10) 100 MG CAPS Take 100 mg by mouth daily.  Marland Kitchen doxazosin (CARDURA) 8 MG tablet Take 1 tablet (8 mg total) by mouth at bedtime.  . ferrous sulfate 325 (65 FE) MG tablet Take 325 mg by mouth daily with breakfast.  . finasteride (PROSCAR) 5 MG tablet Take 5 mg by mouth daily.  . furosemide (LASIX) 20 MG tablet Take 20 mg by mouth daily.  Marland Kitchen glucosamine-chondroitin 500-400 MG tablet Take 2 tablets by mouth daily.   Marland Kitchen MAGNESIUM-ZINC PO Take 1 tablet by mouth daily.  . Multiple Vitamin (MULTIVITAMIN) tablet Take 1 tablet by mouth daily.  . NON FORMULARY CPAP machine at bedtime  . Omega-3 Fatty Acids (FISH OIL) 1200 MG CAPS Take 1,200 mg by  mouth daily.   . pantoprazole (PROTONIX) 40 MG tablet Take 1 tablet (40 mg total) by mouth daily.  . Probiotic Product (PRO-BIOTIC BLEND) CAPS Take 1 capsule by mouth daily.   . tamsulosin (FLOMAX) 0.4 MG CAPS capsule Take 0.4 mg by mouth daily.    No facility-administered encounter medications on file as of 07/25/2017.    Allergies  Allergen Reactions  . Neosporin [Neomycin-Bacitracin Zn-Polymyx] Rash   Patient Active Problem List    Diagnosis Date Noted  . MDS (myelodysplastic syndrome) ([place]) 06/03/2017  . S/P inguinal hernia repair 01/01/2017  . Macrocytic anemia 04/16/2016  . Lung nodule seen on imaging study 08/18/2015  . Severe aortic stenosis   . Aortic stenosis   . Angina pectoris ([place])   . Hyperlipidemia 04/11/2009  . GERD 12/02/2008  . Sleep apnea 08/21/2007  . BPH (benign prostatic hypertrophy) 08/21/2007   Social History   Socioeconomic History  . Marital status: Married    Spouse name: Not on file  . Number of children: Not on file  . Years of education: Not on file  . Highest education level: Not on file  Occupational History  . Not on file  Social Needs  . Financial resource strain: Not on file  . Food insecurity:    Worry: Not on file    Inability: Not on file  . Transportation needs:    Medical: Not on file    Non-medical: Not on file  Tobacco Use  . Smoking status: Never Smoker  . Smokeless tobacco: Never Used  Substance and Sexual Activity  . Alcohol use: No    Alcohol/week: 0.0 oz  . Drug use: No  . Sexual activity: Yes  Lifestyle  . Physical activity:    Days per week: Not on file    Minutes per session: Not on file  . Stress: Not on file  Relationships  . Social connections:    Talks on phone: Not on file    Gets together: Not on file    Attends religious service: Not on file    Active member of club or organization: Not on file    Attends meetings of clubs or organizations: Not on file    Relationship status: Not on file  . Intimate partner violence:    Fear of current or ex partner: Not on file    Emotionally abused: Not on file    Physically abused: Not on file    Forced sexual activity: Not on file  Other Topics Concern  . Not on file  Social History Narrative  . Not on file    Mr. Savannah family history includes Asthma in his mother; Esophageal cancer in his other; Heart disease in his other.      Objective:    Vitals:   07/25/17 1515  BP: (!) 106/52   Pulse: 68    Physical Exam; well-developed elderly white male in no acute distress, pleasant accompanied by his wife blood pressure 106/52 pulse 68, height 5 foot 2, weight 137, BMI 25.1. HEENT ;nontraumatic normocephalic EOMI PERRLA sclera anicteric, oropharynx clear, neck supple Cardiovascular regular rate and rhythm with P5-K9, Soft systolic murmur, Pulmonary ;clear bilaterally, Abdomen ;soft nontender nondistended bowel sounds are active there is no palpable mass or hepatosplenomegaly bowel sounds are present, Rectal ;exam somewhat decreased sphincter tone, stool brown and heme negative, external hemorrhoidal tags, no internal lesion palpated. Extremities ;no clubbing cyanosis, there is trace edema bilateral ankles, Skin warm and dry, Neuropsych ;alert and oriented 4, grossly  nonfocal mood and affect appropriate       Assessment & Plan:   #67 84 year old white male with chronic progressive normocytic anemia, normal iron studies and recent bone marrow biopsy showing findings consistent with a myelodysplastic disorder, CMML also in differential. Patient is currently being managed with blood transfusions and Aranesp which was started March 2019 #2 intermittent dark stool-suspect secondary to iron therapy. Rectal exam was done today and he is Hemoccult negative. He has no current GI symptoms other than loose stools which she has had for years.  #3 remote history of lymphocytic colitis by biopsy 2008 #4 diverticulosis #5 chronic GERD #6 status post aortic valve replacement  Plan; Long discussion with patient and his wife today regarding his condition and recent diagnosis. He is Hemoccult negative, and does not have iron deficiency and therefore doubt significant GI pathology contributing to his chronic anemia. Patient does not want to have any endoscopic evaluation. He expresses concerns about complications. He does not feel that he has any significant issues with his loose stools and says he  would rather have loose stools then be constipated. It does not sound as if he has any lymphocytic colitis active enough to warrant treatment He will continue Protonix 40 mg daily. Patient will go ahead and complete Hemoccults. We will plan GI follow-up as needed.  Amy S Esterwood PA-C 07/25/2017   Cc: Wilber Bihari Cornett*

## 2017-07-25 NOTE — Patient Instructions (Signed)
If you are age 82 or older, your body mass index should be between 23-30. Your Body mass index is 25.13 kg/m. If this is out of the aforementioned range listed, please consider follow up with your Primary Care Provider.  Follow up with Dr. Hilarie Fredrickson or [facility] PA-C as needed.

## 2017-07-27 NOTE — Progress Notes (Signed)
Reviewed and agree with management plan.  Anothony Bursch T. Harsimran Westman, MD FACG 

## 2017-07-29 ENCOUNTER — Telehealth: Payer: Self-pay | Admitting: *Deleted

## 2017-07-29 NOTE — Telephone Encounter (Signed)
Call received from Kristen Cardinal  in reference to "Mailing completed hemoccult stool kit.  I live I [place].  The Address is not on the card".  Called lab to confirm.  Patient confirms packet  Is foil lined.   [place] address provided with this call instructed to send attention [facility] lab.

## 2017-07-31 ENCOUNTER — Other Ambulatory Visit: Payer: Self-pay | Admitting: *Deleted

## 2017-07-31 DIAGNOSIS — I350 Nonrheumatic aortic (valve) stenosis: Secondary | ICD-10-CM

## 2017-07-31 DIAGNOSIS — E785 Hyperlipidemia, unspecified: Secondary | ICD-10-CM | POA: Diagnosis not present

## 2017-07-31 DIAGNOSIS — K219 Gastro-esophageal reflux disease without esophagitis: Secondary | ICD-10-CM | POA: Diagnosis not present

## 2017-07-31 DIAGNOSIS — D539 Nutritional anemia, unspecified: Secondary | ICD-10-CM

## 2017-07-31 DIAGNOSIS — D469 Myelodysplastic syndrome, unspecified: Secondary | ICD-10-CM | POA: Diagnosis not present

## 2017-07-31 DIAGNOSIS — R195 Other fecal abnormalities: Secondary | ICD-10-CM

## 2017-07-31 DIAGNOSIS — G4730 Sleep apnea, unspecified: Secondary | ICD-10-CM | POA: Diagnosis not present

## 2017-07-31 DIAGNOSIS — D464 Refractory anemia, unspecified: Secondary | ICD-10-CM | POA: Diagnosis not present

## 2017-07-31 LAB — OCCULT BLOOD X 1 CARD TO LAB, STOOL
Fecal Occult Bld: NEGATIVE
Fecal Occult Bld: NEGATIVE
Fecal Occult Bld: NEGATIVE

## 2017-08-05 NOTE — Assessment & Plan Note (Signed)
Severe anemia required blood transfusion Bone marrow biopsy 05/13/2017: Markedly hypercellular marrow, cellularity 9200%, striking increase in megakaryocytes most of which are abnormal with hypo-lobation. Blasts not identified, several small clusters of plasma cells 3% with kappa light chain restriction. Iron stores abundant, Given the fact that the patient has increased monocytes in the peripheral blood, CMML is still in the differential.  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  Lab review:  Follow-up every 2 weeks to recheck blood counts and for blood transfusion as needed

## 2017-08-05 NOTE — Progress Notes (Signed)
Patient Care Team: [name], MD as PCP - General  DIAGNOSIS:  Encounter Diagnosis  Name Primary?  . MDS (myelodysplastic syndrome) ([place])    Current treatment: Aranesp 500 mcg every 3 weeks, blood transfusions monthly CHIEF COMPLIANT: Follow-up of MDS requiring blood transfusions  INTERVAL HISTORY: James Hardy is a 84 year old with above-mentioned history of myelodysplastic syndrome who is here to recheck his blood counts.  He received 2 units of blood transfusion on 07/23/2017.  He felt better after the blood transfusion.  He still has some shortness of breath with exertion.  REVIEW OF SYSTEMS:   Constitutional: Denies fevers, chills or abnormal weight loss Eyes: Denies blurriness of vision Ears, nose, mouth, throat, and face: Denies mucositis or sore throat Respiratory: Shortness of breath to exertion Cardiovascular: Denies palpitation, chest discomfort Gastrointestinal:  Denies nausea, heartburn or change in bowel habits Skin: Denies abnormal skin rashes Lymphatics: Denies new lymphadenopathy or easy bruising Neurological:Denies numbness, tingling or new weaknesses Behavioral/Psych: Mood is stable, no new changes  Extremities: No lower extremity edema All other systems were reviewed with the patient and are negative.  I have reviewed the past medical history, past surgical history, social history and family history with the patient and they are unchanged from previous note.  ALLERGIES:  is allergic to neosporin [neomycin-bacitracin zn-polymyx].  MEDICATIONS:  Current Outpatient Medications  Medication Sig Dispense Refill  . Calcium Citrate 250 MG TABS Take 500 mg by mouth daily.     . Cholecalciferol (VITAMIN D3) 5000 units TABS Take 5,000 Units by mouth daily.    . Coenzyme Q10 (CO Q 10) 100 MG CAPS Take 100 mg by mouth daily.    Marland Kitchen doxazosin (CARDURA) 8 MG tablet Take 1 tablet (8 mg total) by mouth at bedtime. 30 tablet 5  . finasteride (PROSCAR) 5 MG tablet  Take 5 mg by mouth daily.    . furosemide (LASIX) 20 MG tablet Take 20 mg by mouth daily.  4  . glucosamine-chondroitin 500-400 MG tablet Take 2 tablets by mouth daily.     Marland Kitchen MAGNESIUM-ZINC PO Take 1 tablet by mouth daily.    . Multiple Vitamin (MULTIVITAMIN) tablet Take 1 tablet by mouth daily.    . NON FORMULARY CPAP machine at bedtime    . Omega-3 Fatty Acids (FISH OIL) 1200 MG CAPS Take 1,200 mg by mouth daily.     . pantoprazole (PROTONIX) 40 MG tablet Take 1 tablet (40 mg total) by mouth daily. 90 tablet 2  . Probiotic Product (PRO-BIOTIC BLEND) CAPS Take 1 capsule by mouth daily.     . tamsulosin (FLOMAX) 0.4 MG CAPS capsule Take 0.4 mg by mouth daily.      No current facility-administered medications for this visit.     PHYSICAL EXAMINATION: ECOG PERFORMANCE STATUS: 2 - Symptomatic, <50% confined to bed  Vitals:   08/06/17 0850  BP: (!) 115/52  Pulse: 71  Resp: 16  Temp: 97.6 F (36.4 C)  SpO2: 100%   Filed Weights   08/06/17 0850  Weight: 136 lb 9.6 oz (62 kg)    GENERAL:alert, no distress and comfortable SKIN: skin color, texture, turgor are normal, no rashes or significant lesions EYES: normal, Conjunctiva are pink and non-injected, sclera clear OROPHARYNX:no exudate, no erythema and lips, buccal mucosa, and tongue normal  NECK: supple, thyroid normal size, non-tender, without nodularity LYMPH:  no palpable lymphadenopathy in the cervical, axillary or inguinal LUNGS: clear to auscultation and percussion with normal breathing effort HEART: regular rate &  rhythm and no murmurs and no lower extremity edema ABDOMEN:abdomen soft, non-tender and normal bowel sounds MUSCULOSKELETAL:no cyanosis of digits and no clubbing  NEURO: alert & oriented x 3 with fluent speech, no focal motor/sensory deficits EXTREMITIES: No lower extremity edema  LABORATORY DATA:  I have reviewed the data as listed CMP Latest Ref Rng & Units 07/23/2017 04/29/2017 12/27/2016  Glucose 70 - 140 mg/dL  93 98 104(H)  BUN 7 - 26 mg/dL 42(H) 28(H) 26(H)  Creatinine 0.70 - 1.30 mg/dL 1.47(H) 1.40(H) 1.28(H)  Sodium 136 - 145 mmol/L 140 139 139  Potassium 3.5 - 5.1 mmol/L 4.4 4.6 4.9  Chloride 98 - 109 mmol/L 111(H) 112(H) 109  CO2 22 - 29 mmol/L 22 21(L) 23  Calcium 8.4 - 10.4 mg/dL 8.6 8.3(L) 8.4(L)  Total Protein 6.4 - 8.3 g/dL 5.8(L) 5.7(L) -  Total Bilirubin 0.2 - 1.2 mg/dL 0.4 0.3 -  Alkaline Phos 40 - 150 U/L 37(L) 41 -  AST 5 - 34 U/L 16 17 -  ALT 0 - 55 U/L 11 13 -    Lab Results  Component Value Date   WBC 5.3 08/06/2017   HGB 8.4 (L) 08/06/2017   HCT 26.5 (L) 08/06/2017   MCV 92.7 08/06/2017   PLT 205 08/06/2017   NEUTROABS 3.4 08/06/2017    ASSESSMENT & PLAN:  MDS (myelodysplastic syndrome) ([facility]) Severe anemia required blood transfusion Bone marrow biopsy 05/13/2017: Markedly hypercellular marrow, cellularity 9200%, striking increase in megakaryocytes most of which are abnormal with hypo-lobation. Blasts not identified, several small clusters of plasma cells 3% with kappa light chain restriction. Iron stores abundant, Given the fact that the patient has increased monocytes in the peripheral blood, CMML is still in the differential.  Cytogenetics: 71 XY (19) 46 XY, I (17)q 10 (1) He did not have any cytogenetic abnormalities associated with MDS or AML Plan: Continue supportive care with transfusions or growth factor injections as needed.  Lab review:Hemoglobin 8.4, ferritin 1400 I discussed with him about issues related to secondary hemochromatosis from iron overload from blood transfusion. I believe that he needs Exjade. Patient would like to have a second opinion. I recommended that the patient see Dr. Irene Limbo. If Dr. Irene Limbo agrees, we will transfer his care to him.  Patient has been requiring once a month blood transfusions.  He will come back next week for Aranesp injections and in 4 weeks for another Aranesp injection and I will book him for blood transfusion at  that time.  Follow-up in 4 weeks to recheck blood counts and for blood transfusion as needed    Orders Placed This Encounter  Procedures  . Ferritin    Standing Status:   Future    Standing Expiration Date:   08/06/2018  . Iron and TIBC    Standing Status:   Future    Standing Expiration Date:   08/06/2018  . CBC with Differential (Cancer Center Only)    Standing Status:   Future    Standing Expiration Date:   08/07/2018  . Reticulocytes    Standing Status:   Future    Standing Expiration Date:   08/07/2018  . Sample to Blood Bank    Standing Status:   Future    Standing Expiration Date:   08/07/2018   The patient has a good understanding of the overall plan. he agrees with it. he will call with any problems that may develop before the next visit here.   Harriette Ohara, MD 08/06/17

## 2017-08-06 ENCOUNTER — Telehealth: Payer: Self-pay | Admitting: Hematology and Oncology

## 2017-08-06 ENCOUNTER — Telehealth: Payer: Self-pay

## 2017-08-06 ENCOUNTER — Inpatient Hospital Stay (HOSPITAL_BASED_OUTPATIENT_CLINIC_OR_DEPARTMENT_OTHER): Payer: Medicare Other | Admitting: Hematology and Oncology

## 2017-08-06 ENCOUNTER — Inpatient Hospital Stay: Payer: Medicare Other

## 2017-08-06 DIAGNOSIS — D469 Myelodysplastic syndrome, unspecified: Secondary | ICD-10-CM

## 2017-08-06 DIAGNOSIS — D464 Refractory anemia, unspecified: Secondary | ICD-10-CM | POA: Diagnosis not present

## 2017-08-06 DIAGNOSIS — K219 Gastro-esophageal reflux disease without esophagitis: Secondary | ICD-10-CM | POA: Diagnosis not present

## 2017-08-06 DIAGNOSIS — Z79899 Other long term (current) drug therapy: Secondary | ICD-10-CM | POA: Diagnosis not present

## 2017-08-06 DIAGNOSIS — D649 Anemia, unspecified: Secondary | ICD-10-CM

## 2017-08-06 DIAGNOSIS — N400 Enlarged prostate without lower urinary tract symptoms: Secondary | ICD-10-CM | POA: Diagnosis not present

## 2017-08-06 DIAGNOSIS — R195 Other fecal abnormalities: Secondary | ICD-10-CM

## 2017-08-06 DIAGNOSIS — D539 Nutritional anemia, unspecified: Secondary | ICD-10-CM | POA: Diagnosis not present

## 2017-08-06 DIAGNOSIS — G4730 Sleep apnea, unspecified: Secondary | ICD-10-CM | POA: Diagnosis not present

## 2017-08-06 DIAGNOSIS — E785 Hyperlipidemia, unspecified: Secondary | ICD-10-CM | POA: Diagnosis not present

## 2017-08-06 LAB — CBC WITH DIFFERENTIAL (CANCER CENTER ONLY)
BASOS ABS: 0 10*3/uL (ref 0.0–0.1)
Basophils Relative: 0 %
EOS PCT: 0 %
Eosinophils Absolute: 0 10*3/uL (ref 0.0–0.5)
HCT: 26.5 % — ABNORMAL LOW (ref 38.4–49.9)
Hemoglobin: 8.4 g/dL — ABNORMAL LOW (ref 13.0–17.1)
LYMPHS ABS: 0.6 10*3/uL — AB (ref 0.9–3.3)
LYMPHS PCT: 12 %
MCH: 29.4 pg (ref 27.2–33.4)
MCHC: 31.7 g/dL — ABNORMAL LOW (ref 32.0–36.0)
MCV: 92.7 fL (ref 79.3–98.0)
Monocytes Absolute: 1.2 10*3/uL — ABNORMAL HIGH (ref 0.1–0.9)
Monocytes Relative: 23 %
Neutro Abs: 3.4 10*3/uL (ref 1.5–6.5)
Neutrophils Relative %: 65 %
Platelet Count: 205 10*3/uL (ref 140–400)
RBC: 2.86 MIL/uL — ABNORMAL LOW (ref 4.20–5.82)
RDW: 17.6 % — ABNORMAL HIGH (ref 11.0–14.6)
WBC Count: 5.3 10*3/uL (ref 4.0–10.3)

## 2017-08-06 LAB — IRON AND TIBC
IRON: 144 ug/dL (ref 42–163)
Saturation Ratios: 101 % (ref 42–163)
TIBC: 143 ug/dL — AB (ref 202–409)
UIBC: UNDETERMINED ug/dL

## 2017-08-06 LAB — FERRITIN: FERRITIN: 2170 ng/mL — AB (ref 22–316)

## 2017-08-06 LAB — RETICULOCYTES
RBC.: 2.86 MIL/uL — AB (ref 4.20–5.82)
RETIC COUNT ABSOLUTE: 14.3 10*3/uL — AB (ref 34.8–93.9)
RETIC CT PCT: 0.5 % — AB (ref 0.8–1.8)

## 2017-08-06 LAB — SAMPLE TO BLOOD BANK

## 2017-08-06 NOTE — Telephone Encounter (Signed)
Gave patient AVs and calendar of upcoming may and June appointments.

## 2017-08-06 NOTE — Telephone Encounter (Signed)
Received hemoglobin results from lab:  8.4 Notified Dr Lindi Adie. No orders at this time.

## 2017-08-06 NOTE — Telephone Encounter (Signed)
Notified Melia RN per Dr Lindi Adie that pt will not be receiving blood today. Amy RN charge in infusion also made aware at this time.

## 2017-08-07 ENCOUNTER — Telehealth: Payer: Self-pay

## 2017-08-07 ENCOUNTER — Other Ambulatory Visit: Payer: Self-pay

## 2017-08-07 DIAGNOSIS — D469 Myelodysplastic syndrome, unspecified: Secondary | ICD-10-CM

## 2017-08-07 NOTE — Telephone Encounter (Signed)
Pt to be scheduled with Dr. Irene Limbo on 08/13/17 for second opinion. Scheduling message sent to create appt for labs and doctor visit. Lab orders placed. LVM with pt to let him know of expected second opinion date and request for him to call the office to schedule.

## 2017-08-11 ENCOUNTER — Telehealth: Payer: Self-pay

## 2017-08-11 NOTE — Progress Notes (Signed)
HEMATOLOGY/ONCOLOGY CONSULTATION NOTE  Date of Service: 08/13/17  Patient Care Team: [name], MD as PCP - General  CHIEF COMPLAINTS/PURPOSE OF CONSULTATION:  Myelodysplastic Syndrome  HISTORY OF PRESENTING ILLNESS:   James Hardy is a wonderful 84 y.o. male who has been referred to [place] by Dr Nicholas Lose for evaluation and management of myelodysplastic syndrome. He is accompanied today by James Hardy. The pt reports that he is doing well overall.   He has been treated with 527mcg Aranesp every 3 weeks with monthly blood transfusions. The pt reports feeling very weak and not having much energy. He notes that he also has loose bowels frequently in the morning, going 8-10 times each day. He notes an increase in frequency during the springtime that he does not associate with any seasonal behaviors. He has discussed this with James PCP. He notes that at James best, he is still moving James bowels 5-6 times a day. He doesn't endorse any food or dietary associations. He takes a multivitamin, fish oil, and Vitamin D each day. He has taken Protonix for about a year, and has stopped taking iron replacement because of James many transfusions.   He notes that he has using Omeprazole for 15-20 years and has very troublesome GERD symptoms when he doesn't use it.   Most recent lab results (08/13/17) of CBC  is as follows: all values are WNL except for RBC at 2.55, Hgb at 7.6, HCT at 23.1, RDW at 17.6, Monocytes Abs at 1.2k, Retic ct pct at 0.4%, Retic ct abs at 10.2, BUN at 39, Creatinine at 1.55, Total Protein at 6.2.  LDH 08/13/17 is elevated at 317.   On review of systems, pt reports fatigue, shortness of breath, frequent loose stools, stable weight, and denies fevers, chills, night sweats, unexpected weight loss, light headedness, dizziness, abdominal pains, leg swelling, testicular pain or swelling, bone pains, and any other symptoms.   On PMHx the pt reports mild chronic kidney disease,  Myelodysplastic synddrome, sleep apnea, hyperlipidemia, dental implants.   MEDICAL HISTORY:  Past Medical History:  Diagnosis Date  . ANGULAR CHEILITIS 04/11/2009   Qualifier: Diagnosis of  By: Joyce Gross    . APHTHOUS ULCERS 12/19/2009   Qualifier: Diagnosis of  By: [name] MD, Bruce    . BENIGN PROSTATIC HYPERTROPHY, HX OF 08/21/2007   Qualifier: Diagnosis of  By: Jerral Ralph    . Cancer ([place])    left leg squamous cell removed  . CERUMEN IMPACTION 04/11/2009   Qualifier: Diagnosis of  By: Joyce Gross    . COLITIS, HX OF 08/21/2007   Qualifier: Diagnosis of  By: Jerral Ralph    . COLONIC POLYPS, HX OF 12/02/2008   Qualifier: Diagnosis of  By: [name] MD, Bruce    . DIVERTICULOSIS, COLON 08/28/2006   Qualifier: Diagnosis of  By: Jerral Ralph    . GERD 12/02/2008   Qualifier: Diagnosis of  By: [name] MD, Bruce    . H/O [facility] spotted fever   . HYPERLIPIDEMIA 04/11/2009   Qualifier: Diagnosis of  By: Joyce Gross    . INTERNAL HEMORRHOIDS 08/28/2006   Qualifier: Diagnosis of  By: Jerral Ralph    . Nocturia   . OSTEOPOROSIS 08/21/2007   Qualifier: Diagnosis of  By: Jerral Ralph    . Shortness of breath dyspnea    with exertion  . SLEEP APNEA 08/21/2007   Qualifier: Diagnosis of  By: Jerral Ralph      SURGICAL HISTORY: Past Surgical  History:  Procedure Laterality Date  . CARDIAC CATHETERIZATION N/A 07/25/2015   Procedure: Right/Left Heart Cath and Coronary Angiography;  Surgeon: Sherren Mocha, MD;  Location: [facility];  Service: Cardiovascular;  Laterality: N/A;  . CATARACT EXTRACTION, BILATERAL    . COLONOSCOPY    . EYE SURGERY Bilateral    "lift"  . [place]  2001  . INGUINAL HERNIA REPAIR Left 01/01/2017   Procedure: LAPAROSCOPIC REPAIR LEFT INGUINAL HERNIA WITH MESH;  Surgeon: [name], MD;  Location: [facility];  Service: General;  Laterality: Left;  . INSERTION OF MESH Left  01/01/2017   Procedure: INSERTION OF MESH;  Surgeon: [name], MD;  Location: [facility];  Service: General;  Laterality: Left;  . SKIN CANCER EXCISION     left leg  . TEE WITHOUT CARDIOVERSION N/A 08/08/2015   Procedure: TRANSESOPHAGEAL ECHOCARDIOGRAM (TEE);  Surgeon: Sherren Mocha, MD;  Location: [facility];  Service: Open Heart Surgery;  Laterality: N/A;  . TRANSCATHETER AORTIC VALVE REPLACEMENT, TRANSFEMORAL N/A 08/08/2015   Procedure: TRANSCATHETER AORTIC VALVE REPLACEMENT, TRANSFEMORAL;  Surgeon: Sherren Mocha, MD;  Location: [facility];  Service: Open Heart Surgery;  Laterality: N/A;    SOCIAL HISTORY: Social History   Socioeconomic History  . Marital status: Married    Spouse name: Not on file  . Number of children: Not on file  . Years of education: Not on file  . Highest education level: Not on file  Occupational History  . Not on file  Social Needs  . Financial resource strain: Not on file  . Food insecurity:    Worry: Not on file    Inability: Not on file  . Transportation needs:    Medical: Not on file    Non-medical: Not on file  Tobacco Use  . Smoking status: Never Smoker  . Smokeless tobacco: Never Used  Substance and Sexual Activity  . Alcohol use: No    Alcohol/week: 0.0 oz  . Drug use: No  . Sexual activity: Yes  Lifestyle  . Physical activity:    Days per week: Not on file    Minutes per session: Not on file  . Stress: Not on file  Relationships  . Social connections:    Talks on phone: Not on file    Gets together: Not on file    Attends religious service: Not on file    Active member of club or organization: Not on file    Attends meetings of clubs or organizations: Not on file    Relationship status: Not on file  . Intimate partner violence:    Fear of current or ex partner: Not on file    Emotionally abused: Not on file    Physically abused: Not on file    Forced sexual activity: Not on file  Other Topics Concern  . Not on file  Social History  Narrative  . Not on file    FAMILY HISTORY: Family History  Problem Relation Age of Onset  . Asthma Mother   . Heart disease Other   . Esophageal cancer Other   . Colon cancer Neg Hx   . Pancreatic cancer Neg Hx   . Stomach cancer Neg Hx   . Liver disease Neg Hx     ALLERGIES:  is allergic to neosporin [neomycin-bacitracin zn-polymyx].  MEDICATIONS:  Current Outpatient Medications  Medication Sig Dispense Refill  . Calcium Citrate 250 MG TABS Take 500 mg by mouth daily.     . Cholecalciferol (VITAMIN D3)  5000 units TABS Take 5,000 Units by mouth daily.    . Coenzyme Q10 (CO Q 10) 100 MG CAPS Take 100 mg by mouth daily.    Marland Kitchen doxazosin (CARDURA) 8 MG tablet Take 1 tablet (8 mg total) by mouth at bedtime. 30 tablet 5  . finasteride (PROSCAR) 5 MG tablet Take 5 mg by mouth daily.    . furosemide (LASIX) 20 MG tablet Take 20 mg by mouth daily.  4  . glucosamine-chondroitin 500-400 MG tablet Take 2 tablets by mouth daily.     Marland Kitchen MAGNESIUM-ZINC PO Take 1 tablet by mouth daily.    . Multiple Vitamin (MULTIVITAMIN) tablet Take 1 tablet by mouth daily.    . NON FORMULARY CPAP machine at bedtime    . Omega-3 Fatty Acids (FISH OIL) 1200 MG CAPS Take 1,200 mg by mouth daily.     . pantoprazole (PROTONIX) 40 MG tablet Take 1 tablet (40 mg total) by mouth daily. 90 tablet 2  . Probiotic Product (PRO-BIOTIC BLEND) CAPS Take 1 capsule by mouth daily.     . tamsulosin (FLOMAX) 0.4 MG CAPS capsule Take 0.4 mg by mouth daily.      No current facility-administered medications for this visit.     REVIEW OF SYSTEMS:    10 Point review of Systems was done is negative except as noted above.  PHYSICAL EXAMINATION: ECOG PERFORMANCE STATUS: 2 - Symptomatic, <50% confined to bed  . Vitals:   08/13/17 1123  BP: (!) 107/43  Pulse: 72  Resp: 16  Temp: 98.5 F (36.9 C)  SpO2: 100%   Filed Weights   08/13/17 1123  Weight: 135 lb 1.6 oz (61.3 kg)   .Body mass index is 24.71  kg/m.  GENERAL:alert, in no acute distress and comfortable SKIN: no acute rashes, no significant lesions EYES: conjunctiva are pink and non-injected, sclera anicteric OROPHARYNX: MMM, no exudates, no oropharyngeal erythema or ulceration NECK: supple, no JVD LYMPH:  no palpable lymphadenopathy in the cervical, axillary or inguinal regions LUNGS: clear to auscultation b/l with normal respiratory effort HEART: regular rate & rhythm ABDOMEN:  normoactive bowel sounds , non tender, not distended. Extremity: no pedal edema PSYCH: alert & oriented x 3 with fluent speech NEURO: no focal motor/sensory deficits  LABORATORY DATA:  I have reviewed the data as listed  . CBC Latest Ref Rng & Units 08/13/2017 08/06/2017 07/23/2017  WBC 4.0 - 10.3 K/uL 5.8 5.3 5.8  Hemoglobin 13.0 - 17.1 g/dL 7.6(L) 8.4(L) 7.1(L)  Hematocrit 38.4 - 49.9 % 23.1(L) 26.5(L) 22.1(L)  Platelets 140 - 400 K/uL 177 205 226    . CMP Latest Ref Rng & Units 08/13/2017 07/23/2017 04/29/2017  Glucose 70 - 140 mg/dL 87 93 98  BUN 7 - 26 mg/dL 39(H) 42(H) 28(H)  Creatinine 0.70 - 1.30 mg/dL 1.55(H) 1.47(H) 1.40(H)  Sodium 136 - 145 mmol/L 138 140 139  Potassium 3.5 - 5.1 mmol/L 4.7 4.4 4.6  Chloride 98 - 109 mmol/L 108 111(H) 112(H)  CO2 22 - 29 mmol/L 24 22 21(L)  Calcium 8.4 - 10.4 mg/dL 8.5 8.6 8.3(L)  Total Protein 6.4 - 8.3 g/dL 6.2(L) 5.8(L) 5.7(L)  Total Bilirubin 0.2 - 1.2 mg/dL 0.4 0.4 0.3  Alkaline Phos 40 - 150 U/L 44 37(L) 41  AST 5 - 34 U/L 18 16 17   ALT 0 - 55 U/L 17 11 13    05/13/17 Cytogenetics     05/13/17 BM Bx:      RADIOGRAPHIC STUDIES: I have personally reviewed  the radiological images as listed and agreed with the findings in the report. No results found.  ASSESSMENT & PLAN:   84 y.o. male with  1. MDS- NOS presenting primarily as a refractory anemia. -Discussed patient's most recent labs from 08/13/17, worsening anemia noted with Hgb at 7.6. LDH increased to 317.  -Reticulocytes have  progressively dropped over the last few months -05/13/17 BM Bx revealed pt had more BM than expected with 90% BM cells. No obvious malignant changes identified.  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 -Will adjust Aranesp to every 2 weeks from every 3 weeks, while continuing to monitor James BP which is not currently prohibitive.   -Offered pt a transfusion today given James Hgb at 7.6, pt would like to wait two weeks on this and will call our clinic if he develops any worsening or concerning symptoms.  -Begin taking B complex vitamin -Will see pt back in 3 weeks with genetics -Continue with Aranesp today    Aranesp today and then change to every 2 weeks ongoing RTC with Dr Irene Limbo in 2 weeks with labs and PRBC transfusion appointment (2 units)   All of the patients questions were answered with apparent satisfaction. The patient knows to call the clinic with any problems, questions or concerns.  . The total time spent in the appointment was 45 minutes and more than 50% was on counseling and direct patient cares.      Sullivan Lone MD [facility] AAHIVMS [facility] Hematology/Oncology Physician [facility]  (Office):       [phone] (Work cell):  [phone] (Fax):           [phone]  08/13/2017 12:09 PM  This document serves as a record of services personally performed by Sullivan Lone, MD. It was created on James behalf by Baldwin Jamaica, a trained medical scribe. The creation of this record is based on the scribe's personal observations and the provider's statements to them.   .I have reviewed the above documentation for accuracy and completeness, and I agree with the above. Brunetta Genera MD MS

## 2017-08-11 NOTE — Telephone Encounter (Signed)
Spoke with patient concerning upcoming appointment. Per 5/17 in basket msg to r/s

## 2017-08-13 ENCOUNTER — Encounter: Payer: Self-pay | Admitting: Hematology

## 2017-08-13 ENCOUNTER — Inpatient Hospital Stay (HOSPITAL_BASED_OUTPATIENT_CLINIC_OR_DEPARTMENT_OTHER): Payer: Medicare Other | Admitting: Hematology

## 2017-08-13 ENCOUNTER — Inpatient Hospital Stay: Payer: Medicare Other

## 2017-08-13 ENCOUNTER — Telehealth: Payer: Self-pay

## 2017-08-13 VITALS — BP 107/43 | HR 72 | Temp 98.5°F | Resp 16 | Wt 135.1 lb

## 2017-08-13 DIAGNOSIS — N400 Enlarged prostate without lower urinary tract symptoms: Secondary | ICD-10-CM | POA: Diagnosis not present

## 2017-08-13 DIAGNOSIS — D469 Myelodysplastic syndrome, unspecified: Secondary | ICD-10-CM | POA: Diagnosis not present

## 2017-08-13 DIAGNOSIS — D464 Refractory anemia, unspecified: Secondary | ICD-10-CM | POA: Diagnosis not present

## 2017-08-13 DIAGNOSIS — K219 Gastro-esophageal reflux disease without esophagitis: Secondary | ICD-10-CM

## 2017-08-13 DIAGNOSIS — D539 Nutritional anemia, unspecified: Secondary | ICD-10-CM

## 2017-08-13 DIAGNOSIS — D649 Anemia, unspecified: Secondary | ICD-10-CM

## 2017-08-13 DIAGNOSIS — G4730 Sleep apnea, unspecified: Secondary | ICD-10-CM

## 2017-08-13 DIAGNOSIS — Z79899 Other long term (current) drug therapy: Secondary | ICD-10-CM | POA: Diagnosis not present

## 2017-08-13 DIAGNOSIS — E785 Hyperlipidemia, unspecified: Secondary | ICD-10-CM | POA: Diagnosis not present

## 2017-08-13 LAB — CMP (CANCER CENTER ONLY)
ALBUMIN: 3.7 g/dL (ref 3.5–5.0)
ALK PHOS: 44 U/L (ref 40–150)
ALT: 17 U/L (ref 0–55)
ANION GAP: 6 (ref 3–11)
AST: 18 U/L (ref 5–34)
BUN: 39 mg/dL — ABNORMAL HIGH (ref 7–26)
CHLORIDE: 108 mmol/L (ref 98–109)
CO2: 24 mmol/L (ref 22–29)
CREATININE: 1.55 mg/dL — AB (ref 0.70–1.30)
Calcium: 8.5 mg/dL (ref 8.4–10.4)
GFR, Est AFR Am: 45 mL/min — ABNORMAL LOW (ref >60)
GFR, Estimated: 39 mL/min — ABNORMAL LOW (ref >60)
GLUCOSE: 87 mg/dL (ref 70–140)
Potassium: 4.7 mmol/L (ref 3.5–5.1)
SODIUM: 138 mmol/L (ref 136–145)
Total Bilirubin: 0.4 mg/dL (ref 0.2–1.2)
Total Protein: 6.2 g/dL — ABNORMAL LOW (ref 6.4–8.3)

## 2017-08-13 LAB — CBC WITH DIFFERENTIAL (CANCER CENTER ONLY)
BASOS ABS: 0 10*3/uL (ref 0.0–0.1)
BASOS PCT: 1 %
Eosinophils Absolute: 0 10*3/uL (ref 0.0–0.5)
Eosinophils Relative: 0 %
HEMATOCRIT: 23.1 % — AB (ref 38.4–49.9)
HEMOGLOBIN: 7.6 g/dL — AB (ref 13.0–17.1)
LYMPHS PCT: 16 %
Lymphs Abs: 0.9 10*3/uL (ref 0.9–3.3)
MCH: 29.8 pg (ref 27.2–33.4)
MCHC: 32.9 g/dL (ref 32.0–36.0)
MCV: 90.6 fL (ref 79.3–98.0)
Monocytes Absolute: 1.2 10*3/uL — ABNORMAL HIGH (ref 0.1–0.9)
Monocytes Relative: 20 %
NEUTROS ABS: 3.6 10*3/uL (ref 1.5–6.5)
NEUTROS PCT: 63 %
Platelet Count: 177 10*3/uL (ref 140–400)
RBC: 2.55 MIL/uL — ABNORMAL LOW (ref 4.20–5.82)
RDW: 17.6 % — AB (ref 11.0–14.6)
WBC Count: 5.8 10*3/uL (ref 4.0–10.3)

## 2017-08-13 LAB — RETICULOCYTES
RBC.: 2.55 MIL/uL — AB (ref 4.20–5.82)
RETIC COUNT ABSOLUTE: 10.2 10*3/uL — AB (ref 34.8–93.9)
Retic Ct Pct: 0.4 % — ABNORMAL LOW (ref 0.8–1.8)

## 2017-08-13 LAB — SAMPLE TO BLOOD BANK

## 2017-08-13 LAB — LACTATE DEHYDROGENASE: LDH: 317 U/L — AB (ref 125–245)

## 2017-08-13 MED ORDER — DARBEPOETIN ALFA 500 MCG/ML IJ SOSY
PREFILLED_SYRINGE | INTRAMUSCULAR | Status: AC
  Filled 2017-08-13: qty 1

## 2017-08-13 MED ORDER — DARBEPOETIN ALFA 500 MCG/ML IJ SOSY
500.0000 ug | PREFILLED_SYRINGE | Freq: Once | INTRAMUSCULAR | Status: AC
  Administered 2017-08-13: 500 ug via SUBCUTANEOUS

## 2017-08-13 NOTE — Patient Instructions (Signed)

## 2017-08-13 NOTE — Telephone Encounter (Signed)
Printed avs and calender of upcoming appointment. Per 5/22 los 

## 2017-08-13 NOTE — Patient Instructions (Signed)
Thank you for choosing [facility] to provide your oncology and hematology care.  To afford each patient quality time with our providers, please arrive 30 minutes before your scheduled appointment time.  If you arrive late for your appointment, you may be asked to reschedule.  We strive to give you quality time with our providers, and arriving late affects you and other patients whose appointments are after yours.   If you are a no show for multiple scheduled visits, you may be dismissed from the clinic at the providers discretion.    Again, thank you for choosing [facility], our hope is that these requests will decrease the amount of time that you wait before being seen by our physicians.  ______________________________________________________________________  Should you have questions after your visit to the  Cancer Center, please contact our office at [phone] between the hours of 8:30 and 4:30 p.m.    Voicemails left after 4:30p.m will not be returned until the following business day.    For prescription refill requests, please have your pharmacy contact us directly.  Please also try to allow 48 hours for prescription requests.    Please contact the scheduling department for questions regarding scheduling.  For scheduling of procedures such as PET scans, CT scans, MRI, Ultrasound, etc please contact central scheduling at [phone].    Resources For Cancer Patients and Caregivers:   Oncolink.org:  A wonderful resource for patients and healthcare providers for information regarding your disease, ways to tract your treatment, what to expect, etc.     American Cancer Society:  800-227-2345  Can help patients locate various types of support and financial assistance  Cancer Care: 1-800-813-HOPE (4673) Provides financial assistance, online support groups, medication/co-pay assistance.    [place] DSS:  336-641-3447 Where to apply for food  stamps, Medicaid, and utility assistance  Medicare Rights Center: 800-333-4114 Helps people with Medicare understand their rights and benefits, navigate the Medicare system, and secure the quality healthcare they deserve  SCAT: 336-333-6589 [place] [employer]'s shared-ride transportation service for eligible riders who have a disability that prevents them from riding the fixed route bus.    For additional information on assistance programs please contact our social worker:   Grier Hock/Abigail Elmore:  [phone]            

## 2017-08-14 ENCOUNTER — Inpatient Hospital Stay: Payer: Medicare Other

## 2017-08-15 ENCOUNTER — Telehealth: Payer: Self-pay | Admitting: *Deleted

## 2017-08-15 NOTE — Telephone Encounter (Signed)
Received fax from [facility] stating labs sent for [facility] were sent in tubes for liquid testing rather than the appropriate heme testing.  Notified Staci Righter, Management consultant.  Per Dr. Irene Limbo, would check with pathology to see if enough sample from [facility] to send to [facility].  SW Rhonda in pathology, will notify Dr. Gari Crown to have sent to [facility].  Will call us back if no adequate sample.

## 2017-08-21 ENCOUNTER — Other Ambulatory Visit: Payer: Self-pay | Admitting: Emergency Medicine

## 2017-08-21 ENCOUNTER — Other Ambulatory Visit: Payer: Self-pay

## 2017-08-21 ENCOUNTER — Telehealth: Payer: Self-pay

## 2017-08-21 ENCOUNTER — Inpatient Hospital Stay: Payer: Medicare Other

## 2017-08-21 DIAGNOSIS — D469 Myelodysplastic syndrome, unspecified: Secondary | ICD-10-CM

## 2017-08-21 DIAGNOSIS — E785 Hyperlipidemia, unspecified: Secondary | ICD-10-CM | POA: Diagnosis not present

## 2017-08-21 DIAGNOSIS — D539 Nutritional anemia, unspecified: Secondary | ICD-10-CM

## 2017-08-21 DIAGNOSIS — D464 Refractory anemia, unspecified: Secondary | ICD-10-CM | POA: Diagnosis not present

## 2017-08-21 DIAGNOSIS — G4730 Sleep apnea, unspecified: Secondary | ICD-10-CM | POA: Diagnosis not present

## 2017-08-21 DIAGNOSIS — K219 Gastro-esophageal reflux disease without esophagitis: Secondary | ICD-10-CM | POA: Diagnosis not present

## 2017-08-21 LAB — CBC (CANCER CENTER ONLY)
HCT: 19.3 % — ABNORMAL LOW (ref 38.4–49.9)
HEMOGLOBIN: 6.3 g/dL — AB (ref 13.0–17.1)
MCH: 29.3 pg (ref 27.2–33.4)
MCHC: 32.7 g/dL (ref 32.0–36.0)
MCV: 89.7 fL (ref 79.3–98.0)
Platelet Count: 202 10*3/uL (ref 140–400)
RBC: 2.15 MIL/uL — AB (ref 4.20–5.82)
RDW: 18.8 % — ABNORMAL HIGH (ref 11.0–14.6)
WBC: 5.4 10*3/uL (ref 4.0–10.3)

## 2017-08-21 LAB — PREPARE RBC (CROSSMATCH)

## 2017-08-21 MED ORDER — DIPHENHYDRAMINE HCL 25 MG PO CAPS
25.0000 mg | ORAL_CAPSULE | Freq: Once | ORAL | Status: AC
  Administered 2017-08-21: 25 mg via ORAL

## 2017-08-21 MED ORDER — ACETAMINOPHEN 325 MG PO TABS
ORAL_TABLET | ORAL | Status: AC
  Filled 2017-08-21: qty 2

## 2017-08-21 MED ORDER — SODIUM CHLORIDE 0.9 % IV SOLN
250.0000 mL | Freq: Once | INTRAVENOUS | Status: AC
  Administered 2017-08-21: 250 mL via INTRAVENOUS

## 2017-08-21 MED ORDER — DIPHENHYDRAMINE HCL 25 MG PO CAPS
ORAL_CAPSULE | ORAL | Status: AC
Start: 2017-08-21 — End: ?
  Filled 2017-08-21: qty 1

## 2017-08-21 MED ORDER — ACETAMINOPHEN 325 MG PO TABS
650.0000 mg | ORAL_TABLET | Freq: Once | ORAL | Status: AC
  Administered 2017-08-21: 650 mg via ORAL

## 2017-08-21 NOTE — Patient Instructions (Signed)

## 2017-08-21 NOTE — Telephone Encounter (Signed)
Pt experiencing increased fatigue and weakness with SOB at rest. Pt voiced, "I am barely able to walk out to the mailbox without stopping." Discussed with Dr. Irene Limbo. Plan for pt to come in for lab to draw CBC and Type/Screen. Scheduling message sent to add 3 hour blood for 1230 with Learta Codding, RN in [facility]. Ok per [employer]. Pt made aware of appt and agreeable to come in today. Orders placed for blood and lab work.

## 2017-08-22 LAB — BPAM RBC
BLOOD PRODUCT EXPIRATION DATE: 201906122359
Blood Product Expiration Date: 201906112359
ISSUE DATE / TIME: 201905301318
ISSUE DATE / TIME: 201905301318
UNIT TYPE AND RH: 6200
Unit Type and Rh: 6200

## 2017-08-22 LAB — TYPE AND SCREEN
ABO/RH(D): A POS
Antibody Screen: NEGATIVE
UNIT DIVISION: 0
Unit division: 0

## 2017-08-25 ENCOUNTER — Other Ambulatory Visit: Payer: Self-pay | Admitting: Hematology

## 2017-08-26 NOTE — Progress Notes (Signed)
HEMATOLOGY/ONCOLOGY CONSULTATION NOTE  Date of Service: 08/28/17  Patient Care Team: [name], MD as PCP - General  CHIEF COMPLAINTS/PURPOSE OF CONSULTATION:  Myelodysplastic Syndrome  HISTORY OF PRESENTING ILLNESS:   James Hardy is a wonderful 84 y.o. male who has been referred to [place] by Dr Nicholas Lose for evaluation and management of myelodysplastic syndrome. He is accompanied today by his wife. The pt reports that he is doing well overall.   He has been treated with 569mcg Aranesp every 3 weeks with monthly blood transfusions. The pt reports feeling very weak and not having much energy. He notes that he also has loose bowels frequently in the morning, going 8-10 times each day. He notes an increase in frequency during the springtime that he does not associate with any seasonal behaviors. He has discussed this with his PCP. He notes that at his best, he is still moving his bowels 5-6 times a day. He doesn't endorse any food or dietary associations. He takes a multivitamin, fish oil, and Vitamin D each day. He has taken Protonix for about a year, and has stopped taking iron replacement because of his many transfusions.   He notes that he has using Omeprazole for 15-20 years and has very troublesome GERD symptoms when he doesn't use it.   Most recent lab results (08/13/17) of CBC  is as follows: all values are WNL except for RBC at 2.55, Hgb at 7.6, HCT at 23.1, RDW at 17.6, Monocytes Abs at 1.2k, Retic ct pct at 0.4%, Retic ct abs at 10.2, BUN at 39, Creatinine at 1.55, Total Protein at 6.2.  LDH 08/13/17 is elevated at 317.   On review of systems, pt reports fatigue, shortness of breath, frequent loose stools, stable weight, and denies fevers, chills, night sweats, unexpected weight loss, light headedness, dizziness, abdominal pains, leg swelling, testicular pain or swelling, bone pains, and any other symptoms.   On PMHx the pt reports mild chronic kidney disease,  Myelodysplastic synddrome, sleep apnea, hyperlipidemia, dental implants.  Interval History:   James Hardy returns today regarding his MDS. The patient's last visit with [place] was on 08/13/17. He is accompanied today by his wife and son. The pt reports that he is doing well overall.   The pt reports that his fatigue has lessened after receiving a blood transfusion.   Lab results today (08/28/17) of CBC, CMP is as follows: all values are WNL except for RBC at 2.78, HGB at 7.9, HCT at 24.6, RDW at 17.2, Lymphs Abs at 0.7k, BUN at 34, Creatinine at 1.54, Calcium at 5.8. LDH 08/28/17 is elevated at 294 Haptoglobin 08/28/17 is wnl Vitamin B12 08/28/17 is wnl Folate RBC 08/28/17 is wnl  On review of systems, pt reports less fatigue, and denies leg swelling, abdominal pains and any other symptoms.   MEDICAL HISTORY:  Past Medical History:  Diagnosis Date  . ANGULAR CHEILITIS 04/11/2009   Qualifier: Diagnosis of  By: Joyce Gross    . APHTHOUS ULCERS 12/19/2009   Qualifier: Diagnosis of  By: [name] MD, Bruce    . BENIGN PROSTATIC HYPERTROPHY, HX OF 08/21/2007   Qualifier: Diagnosis of  By: Jerral Ralph    . Cancer ([place])    left leg squamous cell removed  . CERUMEN IMPACTION 04/11/2009   Qualifier: Diagnosis of  By: Joyce Gross    . COLITIS, HX OF 08/21/2007   Qualifier: Diagnosis of  By: Jerral Ralph    . COLONIC POLYPS, HX OF  12/02/2008   Qualifier: Diagnosis of  By: [name] MD, Bruce    . DIVERTICULOSIS, COLON 08/28/2006   Qualifier: Diagnosis of  By: Jerral Ralph    . GERD 12/02/2008   Qualifier: Diagnosis of  By: [name] MD, Bruce    . H/O [facility] spotted fever   . HYPERLIPIDEMIA 04/11/2009   Qualifier: Diagnosis of  By: Joyce Gross    . INTERNAL HEMORRHOIDS 08/28/2006   Qualifier: Diagnosis of  By: Jerral Ralph    . Nocturia   . OSTEOPOROSIS 08/21/2007   Qualifier: Diagnosis of  By: Jerral Ralph    . Shortness of breath dyspnea     with exertion  . SLEEP APNEA 08/21/2007   Qualifier: Diagnosis of  By: Jerral Ralph      SURGICAL HISTORY: Past Surgical History:  Procedure Laterality Date  . CARDIAC CATHETERIZATION N/A 07/25/2015   Procedure: Right/Left Heart Cath and Coronary Angiography;  Surgeon: Sherren Mocha, MD;  Location: [place] [facility];  Service: Cardiovascular;  Laterality: N/A;  . CATARACT EXTRACTION, BILATERAL    . COLONOSCOPY    . EYE SURGERY Bilateral    "lift"  . [place]  2001  . INGUINAL HERNIA REPAIR Left 01/01/2017   Procedure: LAPAROSCOPIC REPAIR LEFT INGUINAL HERNIA WITH MESH;  Surgeon: [name], MD;  Location: [facility];  Service: General;  Laterality: Left;  . INSERTION OF MESH Left 01/01/2017   Procedure: INSERTION OF MESH;  Surgeon: [name], MD;  Location: [facility];  Service: General;  Laterality: Left;  . SKIN CANCER EXCISION     left leg  . TEE WITHOUT CARDIOVERSION N/A 08/08/2015   Procedure: TRANSESOPHAGEAL ECHOCARDIOGRAM (TEE);  Surgeon: Sherren Mocha, MD;  Location: [place];  Service: Open Heart Surgery;  Laterality: N/A;  . TRANSCATHETER AORTIC VALVE REPLACEMENT, TRANSFEMORAL N/A 08/08/2015   Procedure: TRANSCATHETER AORTIC VALVE REPLACEMENT, TRANSFEMORAL;  Surgeon: Sherren Mocha, MD;  Location: [place];  Service: Open Heart Surgery;  Laterality: N/A;    SOCIAL HISTORY: Social History   Socioeconomic History  . Marital status: Married    Spouse name: Not on file  . Number of children: Not on file  . Years of education: Not on file  . Highest education level: Not on file  Occupational History  . Not on file  Social Needs  . Financial resource strain: Not on file  . Food insecurity:    Worry: Not on file    Inability: Not on file  . Transportation needs:    Medical: Not on file    Non-medical: Not on file  Tobacco Use  . Smoking status: Never Smoker  . Smokeless tobacco: Never Used  Substance and Sexual Activity  . Alcohol use: No    Alcohol/week:  0.0 oz  . Drug use: No  . Sexual activity: Yes  Lifestyle  . Physical activity:    Days per week: Not on file    Minutes per session: Not on file  . Stress: Not on file  Relationships  . Social connections:    Talks on phone: Not on file    Gets together: Not on file    Attends religious service: Not on file    Active member of club or organization: Not on file    Attends meetings of clubs or organizations: Not on file    Relationship status: Not on file  . Intimate partner violence:    Fear of current or ex partner: Not on file    Emotionally abused: Not on  file    Physically abused: Not on file    Forced sexual activity: Not on file  Other Topics Concern  . Not on file  Social History Narrative  . Not on file    FAMILY HISTORY: Family History  Problem Relation Age of Onset  . Asthma Mother   . Heart disease Other   . Esophageal cancer Other   . Colon cancer Neg Hx   . Pancreatic cancer Neg Hx   . Stomach cancer Neg Hx   . Liver disease Neg Hx     ALLERGIES:  is allergic to neosporin [neomycin-bacitracin zn-polymyx].  MEDICATIONS:  Current Outpatient Medications  Medication Sig Dispense Refill  . Calcium Citrate 250 MG TABS Take 500 mg by mouth daily.     . Cholecalciferol (VITAMIN D3) 5000 units TABS Take 5,000 Units by mouth daily.    . Coenzyme Q10 (CO Q 10) 100 MG CAPS Take 100 mg by mouth daily.    Marland Kitchen doxazosin (CARDURA) 8 MG tablet Take 1 tablet (8 mg total) by mouth at bedtime. 30 tablet 5  . finasteride (PROSCAR) 5 MG tablet Take 5 mg by mouth daily.    . furosemide (LASIX) 20 MG tablet Take 20 mg by mouth daily.  4  . glucosamine-chondroitin 500-400 MG tablet Take 2 tablets by mouth daily.     Marland Kitchen MAGNESIUM-ZINC PO Take 1 tablet by mouth daily.    . Multiple Vitamin (MULTIVITAMIN) tablet Take 1 tablet by mouth daily.    . NON FORMULARY CPAP machine at bedtime    . Omega-3 Fatty Acids (FISH OIL) 1200 MG CAPS Take 1,200 mg by mouth daily.     . pantoprazole  (PROTONIX) 40 MG tablet Take 1 tablet (40 mg total) by mouth daily. 90 tablet 2  . Probiotic Product (PRO-BIOTIC BLEND) CAPS Take 1 capsule by mouth daily.     . tamsulosin (FLOMAX) 0.4 MG CAPS capsule Take 0.4 mg by mouth daily.      No current facility-administered medications for this visit.     REVIEW OF SYSTEMS:    A 10+ POINT REVIEW OF SYSTEMS WAS OBTAINED including neurology, dermatology, psychiatry, cardiac, respiratory, lymph, extremities, GI, GU, Musculoskeletal, constitutional, breasts, reproductive, HEENT.  All pertinent positives are noted in the HPI.  All others are negative.   PHYSICAL EXAMINATION: ECOG PERFORMANCE STATUS: 2 - Symptomatic, <50% confined to bed  . Vitals:   08/28/17 1100  BP: (!) 125/52  Pulse: 70  Resp: 18  Temp: 97.6 F (36.4 C)  SpO2: 100%   Filed Weights   08/28/17 1100  Weight: 137 lb 4.8 oz (62.3 kg)   .Body mass index is 25.11 kg/m.  GENERAL:alert, in no acute distress and comfortable SKIN: no acute rashes, no significant lesions EYES: conjunctiva are pink and non-injected, sclera anicteric OROPHARYNX: MMM, no exudates, no oropharyngeal erythema or ulceration NECK: supple, no JVD LYMPH:  no palpable lymphadenopathy in the cervical, axillary or inguinal regions LUNGS: clear to auscultation b/l with normal respiratory effort HEART: regular rate & rhythm ABDOMEN:  normoactive bowel sounds , non tender, not distended. Extremity: no pedal edema PSYCH: alert & oriented x 3 with fluent speech NEURO: no focal motor/sensory deficits   LABORATORY DATA:  I have reviewed the data as listed  . CBC Latest Ref Rng & Units 08/28/2017 08/28/2017 08/21/2017  WBC 4.0 - 10.3 K/uL 4.6 - 5.4  Hemoglobin 13.0 - 17.1 g/dL 7.9(L) - 6.3(LL)  Hematocrit 38.4 - 49.9 % 24.6(L) 24.1(L) 19.3(L)  Platelets 140 - 400 K/uL 155 - 202    . CMP Latest Ref Rng & Units 08/28/2017 08/13/2017 07/23/2017  Glucose 70 - 140 mg/dL 107 87 93  BUN 7 - 26 mg/dL 34(H) 39(H) 42(H)   Creatinine 0.70 - 1.30 mg/dL 1.54(H) 1.55(H) 1.47(H)  Sodium 136 - 145 mmol/L 137 138 140  Potassium 3.5 - 5.1 mmol/L 4.3 4.7 4.4  Chloride 98 - 109 mmol/L 109 108 111(H)  CO2 22 - 29 mmol/L 22 24 22   Calcium 8.4 - 10.4 mg/dL 8.1(L) 8.5 8.6  Total Protein 6.4 - 8.3 g/dL 5.8(L) 6.2(L) 5.8(L)  Total Bilirubin 0.2 - 1.2 mg/dL 0.4 0.4 0.4  Alkaline Phos 40 - 150 U/L 47 44 37(L)  AST 5 - 34 U/L 19 18 16   ALT 0 - 55 U/L 18 17 11    05/13/17 Cytogenetics     05/13/17 BM Bx:      RADIOGRAPHIC STUDIES: I have personally reviewed the radiological images as listed and agreed with the findings in the report. No results found.  ASSESSMENT & PLAN:   84 y.o. male with  1. MDS- NOS presenting primarily as a refractory anemia. With  Isochromosome 17q mutation -Reticulocytes have progressively dropped over the last few months -05/13/17 BM Bx revealed pt had more BM than expected with 90% BM cells. No obvious malignant changes identified.  PLAN -Begin taking B complex vitamin -Discussed pt labwork today, 08/28/17; Hgb increased to 7.9 after recent blood transfusion.  -Foundation one genetics are pending today - this will inform treatment options -Isochrome 17 is consistent with MDS -Hypomethylating agent would be recommended if pt has a high risk MDS -Will be checking blood counts every 2 weeks and provide Aranesp injections every 2 weeks as well, while continuing to monitor his BP -transfuse prn if hgb <7.5 or if symptomatic   Continue Aranesp q2weeks with labs q2weeks PRBC transfusion in 2 weeks as needed (appointment slot for 1 unit) RTC with Dr Irene Limbo in 2 weeks with labs   All of the patients questions were answered with apparent satisfaction. The patient knows to call the clinic with any problems, questions or concerns.  The total time spent in the appt was 20 minutes and more than 50% was on counseling and direct patient cares.      Sullivan Lone MD MS AAHIVMS [facility]  [facility] Hematology/Oncology Physician [facility]  (Office):       [phone] (Work cell):  [phone] (Fax):           [phone]  08/28/2017 11:53 AM  I, Baldwin [place], am acting as a Education administrator for Dr Irene Limbo.   .I have reviewed the above documentation for accuracy and completeness, and I agree with the above. Brunetta Genera MD

## 2017-08-28 ENCOUNTER — Inpatient Hospital Stay: Payer: Medicare Other

## 2017-08-28 ENCOUNTER — Inpatient Hospital Stay: Payer: Medicare Other | Attending: Hematology and Oncology | Admitting: Hematology

## 2017-08-28 ENCOUNTER — Other Ambulatory Visit: Payer: Self-pay

## 2017-08-28 ENCOUNTER — Encounter: Payer: Self-pay | Admitting: Hematology

## 2017-08-28 ENCOUNTER — Telehealth: Payer: Self-pay

## 2017-08-28 VITALS — BP 125/52 | HR 70 | Temp 97.6°F | Resp 18 | Ht 62.0 in | Wt 137.3 lb

## 2017-08-28 DIAGNOSIS — D464 Refractory anemia, unspecified: Secondary | ICD-10-CM | POA: Diagnosis not present

## 2017-08-28 DIAGNOSIS — D539 Nutritional anemia, unspecified: Secondary | ICD-10-CM

## 2017-08-28 DIAGNOSIS — D469 Myelodysplastic syndrome, unspecified: Secondary | ICD-10-CM

## 2017-08-28 DIAGNOSIS — R195 Other fecal abnormalities: Secondary | ICD-10-CM

## 2017-08-28 LAB — CBC WITH DIFFERENTIAL/PLATELET
BASOS PCT: 0 %
Basophils Absolute: 0 10*3/uL (ref 0.0–0.1)
EOS ABS: 0 10*3/uL (ref 0.0–0.5)
EOS PCT: 0 %
HEMATOCRIT: 24.6 % — AB (ref 38.4–49.9)
Hemoglobin: 7.9 g/dL — ABNORMAL LOW (ref 13.0–17.1)
Lymphocytes Relative: 15 %
Lymphs Abs: 0.7 10*3/uL — ABNORMAL LOW (ref 0.9–3.3)
MCH: 28.4 pg (ref 27.2–33.4)
MCHC: 32.1 g/dL (ref 32.0–36.0)
MCV: 88.5 fL (ref 79.3–98.0)
MONO ABS: 0.8 10*3/uL (ref 0.1–0.9)
MONOS PCT: 18 %
Neutro Abs: 3 10*3/uL (ref 1.5–6.5)
Neutrophils Relative %: 67 %
PLATELETS: 155 10*3/uL (ref 140–400)
RBC: 2.78 MIL/uL — ABNORMAL LOW (ref 4.20–5.82)
RDW: 17.2 % — AB (ref 11.0–14.6)
WBC: 4.6 10*3/uL (ref 4.0–10.3)

## 2017-08-28 LAB — CMP (CANCER CENTER ONLY)
ALBUMIN: 3.5 g/dL (ref 3.5–5.0)
ALK PHOS: 47 U/L (ref 40–150)
ALT: 18 U/L (ref 0–55)
AST: 19 U/L (ref 5–34)
Anion gap: 6 (ref 3–11)
BILIRUBIN TOTAL: 0.4 mg/dL (ref 0.2–1.2)
BUN: 34 mg/dL — ABNORMAL HIGH (ref 7–26)
CALCIUM: 8.1 mg/dL — AB (ref 8.4–10.4)
CO2: 22 mmol/L (ref 22–29)
CREATININE: 1.54 mg/dL — AB (ref 0.70–1.30)
Chloride: 109 mmol/L (ref 98–109)
GFR, Est AFR Am: 45 mL/min — ABNORMAL LOW (ref >60)
GFR, Estimated: 39 mL/min — ABNORMAL LOW (ref >60)
GLUCOSE: 107 mg/dL (ref 70–140)
Potassium: 4.3 mmol/L (ref 3.5–5.1)
Sodium: 137 mmol/L (ref 136–145)
Total Protein: 5.8 g/dL — ABNORMAL LOW (ref 6.4–8.3)

## 2017-08-28 LAB — LACTATE DEHYDROGENASE: LDH: 294 U/L — ABNORMAL HIGH (ref 125–245)

## 2017-08-28 LAB — VITAMIN B12: VITAMIN B 12: 2650 pg/mL — AB (ref 180–914)

## 2017-08-28 LAB — SAMPLE TO BLOOD BANK

## 2017-08-28 MED ORDER — DARBEPOETIN ALFA 500 MCG/ML IJ SOSY
500.0000 ug | PREFILLED_SYRINGE | Freq: Once | INTRAMUSCULAR | Status: AC
  Administered 2017-08-28: 500 ug via SUBCUTANEOUS

## 2017-08-28 MED ORDER — DARBEPOETIN ALFA 500 MCG/ML IJ SOSY
PREFILLED_SYRINGE | INTRAMUSCULAR | Status: AC
  Filled 2017-08-28: qty 1

## 2017-08-28 NOTE — Telephone Encounter (Signed)
Printed avs and calender of upcoming appointment per 6/6 los

## 2017-08-29 ENCOUNTER — Telehealth: Payer: Self-pay

## 2017-08-29 LAB — FOLATE RBC
FOLATE, HEMOLYSATE: 397.1 ng/mL
FOLATE, RBC: 1648 ng/mL (ref >498)
HEMATOCRIT: 24.1 % — AB (ref 37.5–51.0)

## 2017-08-29 LAB — HAPTOGLOBIN: Haptoglobin: 106 mg/dL (ref 34–200)

## 2017-08-29 NOTE — Telephone Encounter (Signed)
Mailed patient calender. Per 6/6 los

## 2017-09-02 ENCOUNTER — Ambulatory Visit: Payer: Medicare Other

## 2017-09-02 ENCOUNTER — Ambulatory Visit: Payer: Medicare Other | Admitting: Hematology and Oncology

## 2017-09-02 ENCOUNTER — Other Ambulatory Visit: Payer: Medicare Other

## 2017-09-10 NOTE — Progress Notes (Signed)
HEMATOLOGY/ONCOLOGY CONSULTATION NOTE  Date of Service: 09/11/17  Patient Care Team: [name], MD as PCP - General  CHIEF COMPLAINTS/PURPOSE OF CONSULTATION:  Myelodysplastic Syndrome  HISTORY OF PRESENTING ILLNESS:   James Hardy is a wonderful 84 y.o. male who has been referred to [place] by Dr Nicholas Lose for evaluation and management of myelodysplastic syndrome. He is accompanied today by his wife. The pt reports that he is doing well overall.   He has been treated with 565mcg Aranesp every 3 weeks with monthly blood transfusions. The pt reports feeling very weak and not having much energy. He notes that he also has loose bowels frequently in the morning, going 8-10 times each day. He notes an increase in frequency during the springtime that he does not associate with any seasonal behaviors. He has discussed this with his PCP. He notes that at his best, he is still moving his bowels 5-6 times a day. He doesn't endorse any food or dietary associations. He takes a multivitamin, fish oil, and Vitamin D each day. He has taken Protonix for about a year, and has stopped taking iron replacement because of his many transfusions.   He notes that he has using Omeprazole for 15-20 years and has very troublesome GERD symptoms when he doesn't use it.   Most recent lab results (08/13/17) of CBC  is as follows: all values are WNL except for RBC at 2.55, Hgb at 7.6, HCT at 23.1, RDW at 17.6, Monocytes Abs at 1.2k, Retic ct pct at 0.4%, Retic ct abs at 10.2, BUN at 39, Creatinine at 1.55, Total Protein at 6.2.  LDH 08/13/17 is elevated at 317.   On review of systems, pt reports fatigue, shortness of breath, frequent loose stools, stable weight, and denies fevers, chills, night sweats, unexpected weight loss, light headedness, dizziness, abdominal pains, leg swelling, testicular pain or swelling, bone pains, and any other symptoms.   On PMHx the pt reports mild chronic kidney disease,  Myelodysplastic synddrome, sleep apnea, hyperlipidemia, dental implants.  Interval History:   James Hardy returns today regarding his MDS.The patient's last visit with [place] was on 08/28/17. He is accompanied today by his wife. The pt reports that he is doing well overall.   The pt reports that he continues to feel fatigued and is aware of the concerning nature of his disease progression. He reports no newly developed concerns or symptoms.   Lab results today (09/11/17) of CBC, CMP is as follows: all values are WNL except for WBC at 3.9k, RBC at 2.15, HGB at 6.0, HCT at 18.4, RDW at 17.0, PLT at 132, Lymphs abs at 700, BUN at 37, Creatinine at 1.56, Total Protein at 5.9, GFR at 39.  On review of systems, pt reports fatigue, and denies fevers, chills, bleeding, abdominal pains, leg swelling, light headedness, dizziness, and any other symptoms.   MEDICAL HISTORY:  Past Medical History:  Diagnosis Date  . ANGULAR CHEILITIS 04/11/2009   Qualifier: Diagnosis of  By: Joyce Gross    . APHTHOUS ULCERS 12/19/2009   Qualifier: Diagnosis of  By: [name] MD, Bruce    . BENIGN PROSTATIC HYPERTROPHY, HX OF 08/21/2007   Qualifier: Diagnosis of  By: Jerral Ralph    . Cancer ([place])    left leg squamous cell removed  . CERUMEN IMPACTION 04/11/2009   Qualifier: Diagnosis of  By: Joyce Gross    . COLITIS, HX OF 08/21/2007   Qualifier: Diagnosis of  By: Jerral Ralph    .  COLONIC POLYPS, HX OF 12/02/2008   Qualifier: Diagnosis of  By: [name] MD, Bruce    . DIVERTICULOSIS, COLON 08/28/2006   Qualifier: Diagnosis of  By: Jerral Ralph    . GERD 12/02/2008   Qualifier: Diagnosis of  By: [name] MD, Bruce    . H/O [facility] spotted fever   . HYPERLIPIDEMIA 04/11/2009   Qualifier: Diagnosis of  By: Joyce Gross    . INTERNAL HEMORRHOIDS 08/28/2006   Qualifier: Diagnosis of  By: Jerral Ralph    . Nocturia   . OSTEOPOROSIS 08/21/2007   Qualifier: Diagnosis of  By:  Jerral Ralph    . Shortness of breath dyspnea    with exertion  . SLEEP APNEA 08/21/2007   Qualifier: Diagnosis of  By: Jerral Ralph      SURGICAL HISTORY: Past Surgical History:  Procedure Laterality Date  . CARDIAC CATHETERIZATION N/A 07/25/2015   Procedure: Right/Left Heart Cath and Coronary Angiography;  Surgeon: Sherren Mocha, MD;  Location: [place] [facility];  Service: Cardiovascular;  Laterality: N/A;  . CATARACT EXTRACTION, BILATERAL    . COLONOSCOPY    . EYE SURGERY Bilateral    "lift"  . [place]  2001  . INGUINAL HERNIA REPAIR Left 01/01/2017   Procedure: LAPAROSCOPIC REPAIR LEFT INGUINAL HERNIA WITH MESH;  Surgeon: [name], MD;  Location: [facility];  Service: General;  Laterality: Left;  . INSERTION OF MESH Left 01/01/2017   Procedure: INSERTION OF MESH;  Surgeon: [name], MD;  Location: [facility];  Service: General;  Laterality: Left;  . SKIN CANCER EXCISION     left leg  . TEE WITHOUT CARDIOVERSION N/A 08/08/2015   Procedure: TRANSESOPHAGEAL ECHOCARDIOGRAM (TEE);  Surgeon: Sherren Mocha, MD;  Location: [facility];  Service: Open Heart Surgery;  Laterality: N/A;  . TRANSCATHETER AORTIC VALVE REPLACEMENT, TRANSFEMORAL N/A 08/08/2015   Procedure: TRANSCATHETER AORTIC VALVE REPLACEMENT, TRANSFEMORAL;  Surgeon: Sherren Mocha, MD;  Location: [facility];  Service: Open Heart Surgery;  Laterality: N/A;    SOCIAL HISTORY: Social History   Socioeconomic History  . Marital status: Married    Spouse name: Not on file  . Number of children: Not on file  . Years of education: Not on file  . Highest education level: Not on file  Occupational History  . Not on file  Social Needs  . Financial resource strain: Not on file  . Food insecurity:    Worry: Not on file    Inability: Not on file  . Transportation needs:    Medical: Not on file    Non-medical: Not on file  Tobacco Use  . Smoking status: Never Smoker  . Smokeless tobacco: Never Used    Substance and Sexual Activity  . Alcohol use: No    Alcohol/week: 0.0 oz  . Drug use: No  . Sexual activity: Yes  Lifestyle  . Physical activity:    Days per week: Not on file    Minutes per session: Not on file  . Stress: Not on file  Relationships  . Social connections:    Talks on phone: Not on file    Gets together: Not on file    Attends religious service: Not on file    Active member of club or organization: Not on file    Attends meetings of clubs or organizations: Not on file    Relationship status: Not on file  . Intimate partner violence:    Fear of current or ex partner: Not on file  Emotionally abused: Not on file    Physically abused: Not on file    Forced sexual activity: Not on file  Other Topics Concern  . Not on file  Social History Narrative  . Not on file    FAMILY HISTORY: Family History  Problem Relation Age of Onset  . Asthma Mother   . Heart disease Other   . Esophageal cancer Other   . Colon cancer Neg Hx   . Pancreatic cancer Neg Hx   . Stomach cancer Neg Hx   . Liver disease Neg Hx     ALLERGIES:  is allergic to neosporin [neomycin-bacitracin zn-polymyx].  MEDICATIONS:  Current Outpatient Medications  Medication Sig Dispense Refill  . Calcium Citrate 250 MG TABS Take 500 mg by mouth daily.     . Cholecalciferol (VITAMIN D3) 5000 units TABS Take 5,000 Units by mouth daily.    . Coenzyme Q10 (CO Q 10) 100 MG CAPS Take 100 mg by mouth daily.    Marland Kitchen doxazosin (CARDURA) 8 MG tablet Take 1 tablet (8 mg total) by mouth at bedtime. 30 tablet 5  . finasteride (PROSCAR) 5 MG tablet Take 5 mg by mouth daily.    . furosemide (LASIX) 20 MG tablet Take 20 mg by mouth daily.  4  . glucosamine-chondroitin 500-400 MG tablet Take 2 tablets by mouth daily.     Marland Kitchen MAGNESIUM-ZINC PO Take 1 tablet by mouth daily.    . Multiple Vitamin (MULTIVITAMIN) tablet Take 1 tablet by mouth daily.    . NON FORMULARY CPAP machine at bedtime    . Omega-3 Fatty Acids  (FISH OIL) 1200 MG CAPS Take 1,200 mg by mouth daily.     . pantoprazole (PROTONIX) 40 MG tablet Take 1 tablet (40 mg total) by mouth daily. 90 tablet 2  . Probiotic Product (PRO-BIOTIC BLEND) CAPS Take 1 capsule by mouth daily.     . tamsulosin (FLOMAX) 0.4 MG CAPS capsule Take 0.4 mg by mouth daily.      No current facility-administered medications for this visit.     REVIEW OF SYSTEMS:    A 10+ POINT REVIEW OF SYSTEMS WAS OBTAINED including neurology, dermatology, psychiatry, cardiac, respiratory, lymph, extremities, GI, GU, Musculoskeletal, constitutional, breasts, reproductive, HEENT.  All pertinent positives are noted in the HPI.  All others are negative.   PHYSICAL EXAMINATION: ECOG PERFORMANCE STATUS: 2 - Symptomatic, <50% confined to bed  . Vitals:   09/11/17 1353  BP: (!) 113/47  Pulse: 75  Resp: 17  Temp: 98.7 F (37.1 C)  SpO2: 100%   Filed Weights   09/11/17 1353  Weight: 137 lb (62.1 kg)   .Body mass index is 25.06 kg/m.  GENERAL:alert, in no acute distress and comfortable SKIN: no acute rashes, no significant lesions EYES: conjunctiva are pink and non-injected, sclera anicteric OROPHARYNX: MMM, no exudates, no oropharyngeal erythema or ulceration NECK: supple, no JVD LYMPH:  no palpable lymphadenopathy in the cervical, axillary or inguinal regions LUNGS: clear to auscultation b/l with normal respiratory effort HEART: regular rate & rhythm ABDOMEN:  normoactive bowel sounds , non tender, not distended. Extremity: no pedal edema PSYCH: alert & oriented x 3 with fluent speech NEURO: no focal motor/sensory deficits   LABORATORY DATA:  I have reviewed the data as listed  . CBC Latest Ref Rng & Units 09/11/2017 08/28/2017 08/28/2017  WBC 4.0 - 10.3 K/uL 3.9(L) 4.6 -  Hemoglobin 13.0 - 17.1 g/dL 6.0(LL) 7.9(L) -  Hematocrit 38.4 - 49.9 % 18.4(L) 24.6(L)  24.1(L)  Platelets 140 - 400 K/uL 132(L) 155 -    . CMP Latest Ref Rng & Units 09/11/2017 08/28/2017  08/13/2017  Glucose 70 - 140 mg/dL 99 107 87  BUN 7 - 26 mg/dL 37(H) 34(H) 39(H)  Creatinine 0.70 - 1.30 mg/dL 1.56(H) 1.54(H) 1.55(H)  Sodium 136 - 145 mmol/L 137 137 138  Potassium 3.5 - 5.1 mmol/L 4.5 4.3 4.7  Chloride 98 - 109 mmol/L 108 109 108  CO2 22 - 29 mmol/L 22 22 24   Calcium 8.4 - 10.4 mg/dL 8.4 8.1(L) 8.5  Total Protein 6.4 - 8.3 g/dL 5.9(L) 5.8(L) 6.2(L)  Total Bilirubin 0.2 - 1.2 mg/dL 0.4 0.4 0.4  Alkaline Phos 40 - 150 U/L 45 47 44  AST 5 - 34 U/L 15 19 18   ALT 0 - 55 U/L 11 18 17    05/13/17 Cytogenetics     05/13/17 BM Bx:      RADIOGRAPHIC STUDIES: I have personally reviewed the radiological images as listed and agreed with the findings in the report. No results found.  ASSESSMENT & PLAN:   84 y.o. male with  1. MDS- NOS presenting primarily as a refractory anemia. With  Isochromosome 17q mutation -Reticulocytes have progressively dropped over the last few months -05/13/17 BM Bx revealed pt had more BM than expected with 90% BM cells. No obvious malignant changes identified.  PLAN -Begin taking B complex [place] one genetics are pending today - this might additionally inform treatment options -Isochrome 17 is consistent with MDS -Will be checking blood counts every 2 weeks and provide Aranesp injections every 2 weeks as well, while continuing to monitor his BP -transfuse prn if hgb <7.5 or if symptomatic -Discussed pt labwork today, 09/11/17; HGB at 6.0, WBC at 3.9k -Will transfuse 1 unit PRBC today, 2 units on 09/13/17 -Foundation one results are pending -Behaving more like a refractory mutation, with a known isoenzyme 17p mutation -Pt has been placed on maximum dose of Aranesp with ?response -Discussed that the patient's transfusion needs will likely increase and discussed the possible options of Decitabine or Vidaza, transfusion support alone, or comfort measures -Discussed the potential for transfusion support to become complicated by  antibodies if done routinely   -Provided supplemental information about Vidaza -Discussed the potential side effects of hypomethylating agents like [place] a referral for a second opinion at an academic center, pt would like to have this.  -Will provide a referral to Dr Gloriann Loan at [place] would like to hold port placement and treatment decision until pt sees Dr Royce Macadamia at [place] pt to have his affairs in order giving concerning prognosis   1 unit of PRBC today 2 units of PRBC on Saturday Labs and transfusion prn q2weeks Referral to Clint Bolder at [place] for 2nd opinion for MDS with 17p abnormalities continue Aranesp q2 weeks RTC with Dr Irene Limbo in 4 weeks   All of the patients questions were answered with apparent satisfaction. The patient knows to call the clinic with any problems, questions or concerns.  The toal time spent in the appt was 30 minutes and more than 50% was on counseling and direct patient cares.      Sullivan Lone MD MS AAHIVMS [facility] Hematology/Oncology Physician [facility]  (Office):       [phone] (Work cell):  [phone] (Fax):           [phone]  09/11/2017 3:13 PM  I, Baldwin Jamaica, am acting as  a scribe for Dr Irene Limbo.   .I have reviewed the above documentation for accuracy and completeness, and I agree with the above. Brunetta Genera MD

## 2017-09-11 ENCOUNTER — Ambulatory Visit: Payer: Medicare Other

## 2017-09-11 ENCOUNTER — Encounter: Payer: Self-pay | Admitting: Hematology

## 2017-09-11 ENCOUNTER — Other Ambulatory Visit: Payer: Self-pay

## 2017-09-11 ENCOUNTER — Inpatient Hospital Stay: Payer: Medicare Other

## 2017-09-11 ENCOUNTER — Telehealth: Payer: Self-pay

## 2017-09-11 ENCOUNTER — Inpatient Hospital Stay (HOSPITAL_BASED_OUTPATIENT_CLINIC_OR_DEPARTMENT_OTHER): Payer: Medicare Other | Admitting: Hematology

## 2017-09-11 VITALS — BP 148/62 | HR 64 | Temp 98.2°F | Resp 18

## 2017-09-11 VITALS — BP 113/47 | HR 75 | Temp 98.7°F | Resp 17 | Ht 62.0 in | Wt 137.0 lb

## 2017-09-11 DIAGNOSIS — D539 Nutritional anemia, unspecified: Secondary | ICD-10-CM

## 2017-09-11 DIAGNOSIS — D469 Myelodysplastic syndrome, unspecified: Secondary | ICD-10-CM

## 2017-09-11 DIAGNOSIS — D464 Refractory anemia, unspecified: Secondary | ICD-10-CM

## 2017-09-11 LAB — CBC WITH DIFFERENTIAL/PLATELET
BASOS ABS: 0 10*3/uL (ref 0.0–0.1)
BASOS PCT: 1 %
Eosinophils Absolute: 0 10*3/uL (ref 0.0–0.5)
Eosinophils Relative: 0 %
HEMATOCRIT: 18.4 % — AB (ref 38.4–49.9)
HEMOGLOBIN: 6 g/dL — AB (ref 13.0–17.1)
LYMPHS PCT: 18 %
Lymphs Abs: 0.7 10*3/uL — ABNORMAL LOW (ref 0.9–3.3)
MCH: 27.9 pg (ref 27.2–33.4)
MCHC: 32.7 g/dL (ref 32.0–36.0)
MCV: 85.5 fL (ref 79.3–98.0)
MONOS PCT: 17 %
Monocytes Absolute: 0.7 10*3/uL (ref 0.1–0.9)
NEUTROS ABS: 2.5 10*3/uL (ref 1.5–6.5)
Neutrophils Relative %: 64 %
Platelets: 132 10*3/uL — ABNORMAL LOW (ref 140–400)
RBC: 2.15 MIL/uL — ABNORMAL LOW (ref 4.20–5.82)
RDW: 17 % — ABNORMAL HIGH (ref 11.0–14.6)
WBC: 3.9 10*3/uL — ABNORMAL LOW (ref 4.0–10.3)

## 2017-09-11 LAB — CMP (CANCER CENTER ONLY)
ALBUMIN: 3.6 g/dL (ref 3.5–5.0)
ALT: 11 U/L (ref 0–55)
ANION GAP: 7 (ref 3–11)
AST: 15 U/L (ref 5–34)
Alkaline Phosphatase: 45 U/L (ref 40–150)
BUN: 37 mg/dL — AB (ref 7–26)
CHLORIDE: 108 mmol/L (ref 98–109)
CO2: 22 mmol/L (ref 22–29)
Calcium: 8.4 mg/dL (ref 8.4–10.4)
Creatinine: 1.56 mg/dL — ABNORMAL HIGH (ref 0.70–1.30)
GFR, Est AFR Am: 45 mL/min — ABNORMAL LOW (ref >60)
GFR, Estimated: 39 mL/min — ABNORMAL LOW (ref >60)
GLUCOSE: 99 mg/dL (ref 70–140)
POTASSIUM: 4.5 mmol/L (ref 3.5–5.1)
SODIUM: 137 mmol/L (ref 136–145)
TOTAL PROTEIN: 5.9 g/dL — AB (ref 6.4–8.3)
Total Bilirubin: 0.4 mg/dL (ref 0.2–1.2)

## 2017-09-11 LAB — PREPARE RBC (CROSSMATCH)

## 2017-09-11 MED ORDER — SODIUM CHLORIDE 0.9 % IV SOLN
250.0000 mL | Freq: Once | INTRAVENOUS | Status: AC
  Administered 2017-09-11: 250 mL via INTRAVENOUS

## 2017-09-11 MED ORDER — DARBEPOETIN ALFA 500 MCG/ML IJ SOSY
500.0000 ug | PREFILLED_SYRINGE | Freq: Once | INTRAMUSCULAR | Status: AC
  Administered 2017-09-11: 500 ug via SUBCUTANEOUS

## 2017-09-11 MED ORDER — DIPHENHYDRAMINE HCL 25 MG PO CAPS
ORAL_CAPSULE | ORAL | Status: AC
  Filled 2017-09-11: qty 1

## 2017-09-11 MED ORDER — ACETAMINOPHEN 325 MG PO TABS
ORAL_TABLET | ORAL | Status: AC
  Filled 2017-09-11: qty 2

## 2017-09-11 MED ORDER — DARBEPOETIN ALFA 500 MCG/ML IJ SOSY
PREFILLED_SYRINGE | INTRAMUSCULAR | Status: AC
  Filled 2017-09-11: qty 1

## 2017-09-11 MED ORDER — DIPHENHYDRAMINE HCL 25 MG PO CAPS
25.0000 mg | ORAL_CAPSULE | Freq: Once | ORAL | Status: AC
  Administered 2017-09-11: 25 mg via ORAL

## 2017-09-11 MED ORDER — ACETAMINOPHEN 325 MG PO TABS
650.0000 mg | ORAL_TABLET | Freq: Once | ORAL | Status: AC
  Administered 2017-09-11: 650 mg via ORAL

## 2017-09-11 NOTE — Patient Instructions (Signed)

## 2017-09-11 NOTE — Telephone Encounter (Signed)
Critical lab result received. Hemoglobin 6.0. Dr. Irene Limbo made aware.

## 2017-09-12 ENCOUNTER — Inpatient Hospital Stay: Payer: Medicare Other

## 2017-09-12 ENCOUNTER — Telehealth: Payer: Self-pay

## 2017-09-12 DIAGNOSIS — D469 Myelodysplastic syndrome, unspecified: Secondary | ICD-10-CM

## 2017-09-12 DIAGNOSIS — D539 Nutritional anemia, unspecified: Secondary | ICD-10-CM

## 2017-09-12 DIAGNOSIS — D464 Refractory anemia, unspecified: Secondary | ICD-10-CM | POA: Diagnosis not present

## 2017-09-12 LAB — PREPARE RBC (CROSSMATCH)

## 2017-09-12 MED ORDER — ACETAMINOPHEN 325 MG PO TABS
650.0000 mg | ORAL_TABLET | Freq: Once | ORAL | Status: AC
  Administered 2017-09-12: 650 mg via ORAL

## 2017-09-12 MED ORDER — DIPHENHYDRAMINE HCL 25 MG PO CAPS
25.0000 mg | ORAL_CAPSULE | Freq: Once | ORAL | Status: AC
  Administered 2017-09-12: 25 mg via ORAL

## 2017-09-12 MED ORDER — HEPARIN SOD (PORK) LOCK FLUSH 100 UNIT/ML IV SOLN
500.0000 [IU] | Freq: Every day | INTRAVENOUS | Status: DC | PRN
  Filled 2017-09-12: qty 5

## 2017-09-12 MED ORDER — DIPHENHYDRAMINE HCL 25 MG PO CAPS
ORAL_CAPSULE | ORAL | Status: AC
  Filled 2017-09-12: qty 1

## 2017-09-12 MED ORDER — ACETAMINOPHEN 325 MG PO TABS
ORAL_TABLET | ORAL | Status: AC
Start: 2017-09-12 — End: ?
  Filled 2017-09-12: qty 2

## 2017-09-12 NOTE — Patient Instructions (Signed)

## 2017-09-12 NOTE — Telephone Encounter (Signed)
Printed calender of upcoming appointments. Per 6/20 los. Patient was in infusion area and I Levada Dy) was able to explain to him any concerns with the changes that was made to the schedule.

## 2017-09-12 NOTE — Telephone Encounter (Signed)
Per Dr. Irene Limbo referral sent to Dr. Gloriann Loan at [facility] for 2nd opinion for pt.  Patient signed records release form and patient labs, office notes, etc. Faxed to new patient coordinator at [phone]. Confirmation of fax received.

## 2017-09-12 NOTE — Telephone Encounter (Signed)
Currently working on patient schedule from 6/20 los. Waiting on blood 3hr infusion time approval. Placed in [place].

## 2017-09-15 LAB — TYPE AND SCREEN
ABO/RH(D): A POS
Antibody Screen: NEGATIVE
UNIT DIVISION: 0
UNIT DIVISION: 0
Unit division: 0

## 2017-09-15 LAB — BPAM RBC
BLOOD PRODUCT EXPIRATION DATE: 201907102359
BLOOD PRODUCT EXPIRATION DATE: 201907102359
Blood Product Expiration Date: 201907012359
ISSUE DATE / TIME: 201906201630
ISSUE DATE / TIME: 201906210845
ISSUE DATE / TIME: 201906210845
UNIT TYPE AND RH: 6200
UNIT TYPE AND RH: 6200
Unit Type and Rh: 6200

## 2017-09-18 DIAGNOSIS — D649 Anemia, unspecified: Secondary | ICD-10-CM | POA: Diagnosis not present

## 2017-09-23 ENCOUNTER — Encounter (HOSPITAL_COMMUNITY): Payer: Self-pay | Admitting: Hematology

## 2017-09-24 ENCOUNTER — Other Ambulatory Visit: Payer: Self-pay

## 2017-09-24 ENCOUNTER — Telehealth: Payer: Self-pay

## 2017-09-24 ENCOUNTER — Inpatient Hospital Stay: Payer: Medicare Other | Attending: Hematology and Oncology

## 2017-09-24 ENCOUNTER — Inpatient Hospital Stay: Payer: Medicare Other

## 2017-09-24 VITALS — BP 124/77 | HR 62 | Temp 98.1°F | Resp 18

## 2017-09-24 DIAGNOSIS — D630 Anemia in neoplastic disease: Secondary | ICD-10-CM | POA: Insufficient documentation

## 2017-09-24 DIAGNOSIS — D61818 Other pancytopenia: Secondary | ICD-10-CM | POA: Diagnosis not present

## 2017-09-24 DIAGNOSIS — D469 Myelodysplastic syndrome, unspecified: Secondary | ICD-10-CM

## 2017-09-24 DIAGNOSIS — D539 Nutritional anemia, unspecified: Secondary | ICD-10-CM

## 2017-09-24 DIAGNOSIS — D464 Refractory anemia, unspecified: Secondary | ICD-10-CM | POA: Diagnosis not present

## 2017-09-24 LAB — CMP (CANCER CENTER ONLY)
ALT: 15 U/L (ref 0–44)
AST: 13 U/L — AB (ref 15–41)
Albumin: 3.5 g/dL (ref 3.5–5.0)
Alkaline Phosphatase: 41 U/L (ref 38–126)
Anion gap: 5 (ref 5–15)
BUN: 39 mg/dL — AB (ref 8–23)
CO2: 25 mmol/L (ref 22–32)
Calcium: 8.6 mg/dL — ABNORMAL LOW (ref 8.9–10.3)
Chloride: 107 mmol/L (ref 98–111)
Creatinine: 1.56 mg/dL — ABNORMAL HIGH (ref 0.61–1.24)
GFR, EST AFRICAN AMERICAN: 45 mL/min — AB (ref >60)
GFR, EST NON AFRICAN AMERICAN: 39 mL/min — AB (ref >60)
Glucose, Bld: 100 mg/dL — ABNORMAL HIGH (ref 70–99)
POTASSIUM: 4.4 mmol/L (ref 3.5–5.1)
Sodium: 137 mmol/L (ref 135–145)
Total Bilirubin: 0.5 mg/dL (ref 0.3–1.2)
Total Protein: 5.7 g/dL — ABNORMAL LOW (ref 6.5–8.1)

## 2017-09-24 LAB — CBC WITH DIFFERENTIAL (CANCER CENTER ONLY)
BASOS PCT: 1 %
Basophils Absolute: 0 10*3/uL (ref 0.0–0.1)
EOS PCT: 0 %
Eosinophils Absolute: 0 10*3/uL (ref 0.0–0.5)
HCT: 22.9 % — ABNORMAL LOW (ref 38.4–49.9)
Hemoglobin: 7.5 g/dL — ABNORMAL LOW (ref 13.0–17.1)
LYMPHS ABS: 0.9 10*3/uL (ref 0.9–3.3)
Lymphocytes Relative: 27 %
MCH: 28.1 pg (ref 27.2–33.4)
MCHC: 32.8 g/dL (ref 32.0–36.0)
MCV: 85.8 fL (ref 79.3–98.0)
MONOS PCT: 14 %
Monocytes Absolute: 0.5 10*3/uL (ref 0.1–0.9)
Neutro Abs: 2 10*3/uL (ref 1.5–6.5)
Neutrophils Relative %: 58 %
PLATELETS: 103 10*3/uL — AB (ref 140–400)
RBC: 2.67 MIL/uL — AB (ref 4.20–5.82)
RDW: 17.3 % — ABNORMAL HIGH (ref 11.0–14.6)
WBC: 3.3 10*3/uL — AB (ref 4.0–10.3)

## 2017-09-24 LAB — RETICULOCYTES
RBC.: 2.67 MIL/uL — ABNORMAL LOW (ref 4.20–5.82)
RETIC CT PCT: 0.2 % — AB (ref 0.8–1.8)
Retic Count, Absolute: 5.3 10*3/uL — ABNORMAL LOW (ref 34.8–93.9)

## 2017-09-24 LAB — SAMPLE TO BLOOD BANK

## 2017-09-24 MED ORDER — DARBEPOETIN ALFA 500 MCG/ML IJ SOSY
500.0000 ug | PREFILLED_SYRINGE | Freq: Once | INTRAMUSCULAR | Status: AC
  Administered 2017-09-24: 500 ug via SUBCUTANEOUS

## 2017-09-24 NOTE — Telephone Encounter (Signed)
Spoke to patient to confirm he is aware of appointment for blood on 09/25/17 at 9am. Orders for 1 unit of blood placed per Dr. Irene Limbo and patient verbalized understanding of appointment day and time.

## 2017-09-25 ENCOUNTER — Inpatient Hospital Stay: Payer: Medicare Other

## 2017-09-25 VITALS — BP 136/48 | HR 65 | Temp 97.9°F | Resp 16

## 2017-09-25 DIAGNOSIS — D630 Anemia in neoplastic disease: Secondary | ICD-10-CM | POA: Diagnosis not present

## 2017-09-25 DIAGNOSIS — D464 Refractory anemia, unspecified: Secondary | ICD-10-CM | POA: Diagnosis not present

## 2017-09-25 DIAGNOSIS — D61818 Other pancytopenia: Secondary | ICD-10-CM | POA: Diagnosis not present

## 2017-09-25 DIAGNOSIS — Z95828 Presence of other vascular implants and grafts: Secondary | ICD-10-CM

## 2017-09-25 DIAGNOSIS — D469 Myelodysplastic syndrome, unspecified: Secondary | ICD-10-CM

## 2017-09-25 MED ORDER — DIPHENHYDRAMINE HCL 25 MG PO CAPS
ORAL_CAPSULE | ORAL | Status: AC
  Filled 2017-09-25: qty 1

## 2017-09-25 MED ORDER — DIPHENHYDRAMINE HCL 25 MG PO CAPS
25.0000 mg | ORAL_CAPSULE | Freq: Once | ORAL | Status: AC
  Administered 2017-09-25: 25 mg via ORAL

## 2017-09-25 MED ORDER — SODIUM CHLORIDE 0.9 % IV SOLN
250.0000 mL | Freq: Once | INTRAVENOUS | Status: AC
  Administered 2017-09-25: 250 mL via INTRAVENOUS

## 2017-09-25 MED ORDER — SODIUM CHLORIDE 0.9% FLUSH
10.0000 mL | INTRAVENOUS | Status: DC | PRN
  Filled 2017-09-25: qty 10

## 2017-09-25 MED ORDER — ACETAMINOPHEN 325 MG PO TABS
650.0000 mg | ORAL_TABLET | Freq: Once | ORAL | Status: AC
  Administered 2017-09-25: 650 mg via ORAL

## 2017-09-25 MED ORDER — ACETAMINOPHEN 325 MG PO TABS
ORAL_TABLET | ORAL | Status: AC
  Filled 2017-09-25: qty 2

## 2017-09-25 MED ORDER — HEPARIN SOD (PORK) LOCK FLUSH 100 UNIT/ML IV SOLN
500.0000 [IU] | Freq: Once | INTRAVENOUS | Status: DC
  Filled 2017-09-25: qty 5

## 2017-09-25 NOTE — Patient Instructions (Signed)

## 2017-09-26 ENCOUNTER — Other Ambulatory Visit: Payer: Medicare Other

## 2017-09-26 ENCOUNTER — Ambulatory Visit: Payer: Medicare Other

## 2017-09-26 LAB — TYPE AND SCREEN
ABO/RH(D): A POS
ANTIBODY SCREEN: NEGATIVE
Unit division: 0

## 2017-09-26 LAB — BPAM RBC
Blood Product Expiration Date: 201907272359
ISSUE DATE / TIME: 201907040944
Unit Type and Rh: 6200

## 2017-09-30 DIAGNOSIS — R0602 Shortness of breath: Secondary | ICD-10-CM | POA: Diagnosis not present

## 2017-09-30 DIAGNOSIS — G4733 Obstructive sleep apnea (adult) (pediatric): Secondary | ICD-10-CM | POA: Diagnosis not present

## 2017-09-30 DIAGNOSIS — D469 Myelodysplastic syndrome, unspecified: Secondary | ICD-10-CM | POA: Diagnosis not present

## 2017-09-30 DIAGNOSIS — K219 Gastro-esophageal reflux disease without esophagitis: Secondary | ICD-10-CM | POA: Diagnosis not present

## 2017-09-30 DIAGNOSIS — I2510 Atherosclerotic heart disease of native coronary artery without angina pectoris: Secondary | ICD-10-CM | POA: Diagnosis not present

## 2017-09-30 DIAGNOSIS — E785 Hyperlipidemia, unspecified: Secondary | ICD-10-CM | POA: Diagnosis not present

## 2017-09-30 DIAGNOSIS — Z952 Presence of prosthetic heart valve: Secondary | ICD-10-CM | POA: Diagnosis not present

## 2017-10-06 DIAGNOSIS — Z952 Presence of prosthetic heart valve: Secondary | ICD-10-CM | POA: Diagnosis not present

## 2017-10-06 DIAGNOSIS — Z6824 Body mass index (BMI) 24.0-24.9, adult: Secondary | ICD-10-CM | POA: Diagnosis not present

## 2017-10-06 DIAGNOSIS — Z79899 Other long term (current) drug therapy: Secondary | ICD-10-CM | POA: Diagnosis not present

## 2017-10-06 DIAGNOSIS — R0600 Dyspnea, unspecified: Secondary | ICD-10-CM | POA: Diagnosis not present

## 2017-10-06 DIAGNOSIS — N183 Chronic kidney disease, stage 3 (moderate): Secondary | ICD-10-CM | POA: Diagnosis not present

## 2017-10-06 DIAGNOSIS — D469 Myelodysplastic syndrome, unspecified: Secondary | ICD-10-CM | POA: Diagnosis not present

## 2017-10-07 ENCOUNTER — Telehealth: Payer: Self-pay | Admitting: Hematology

## 2017-10-07 NOTE — Telephone Encounter (Signed)
Called pt re appts that were shifted to 7/19 per 7/16 sch msg - left vm for pt re appts.

## 2017-10-08 ENCOUNTER — Telehealth: Payer: Self-pay | Admitting: *Deleted

## 2017-10-08 NOTE — Telephone Encounter (Signed)
Faxed ROI to [facility] - attnEbony Hail; release [phone]

## 2017-10-09 ENCOUNTER — Other Ambulatory Visit: Payer: Medicare Other

## 2017-10-09 ENCOUNTER — Inpatient Hospital Stay: Payer: Medicare Other | Admitting: Hematology

## 2017-10-09 ENCOUNTER — Ambulatory Visit: Payer: Medicare Other

## 2017-10-09 ENCOUNTER — Inpatient Hospital Stay: Payer: Medicare Other

## 2017-10-09 DIAGNOSIS — N183 Chronic kidney disease, stage 3 (moderate): Secondary | ICD-10-CM | POA: Diagnosis not present

## 2017-10-09 DIAGNOSIS — C9200 Acute myeloblastic leukemia, not having achieved remission: Secondary | ICD-10-CM | POA: Diagnosis not present

## 2017-10-09 DIAGNOSIS — D469 Myelodysplastic syndrome, unspecified: Secondary | ICD-10-CM | POA: Diagnosis not present

## 2017-10-09 NOTE — Progress Notes (Signed)
HEMATOLOGY/ONCOLOGY CONSULTATION NOTE  Date of Service: 10/10/17   Patient Care Team: [name], MD as PCP - General  CHIEF COMPLAINTS/PURPOSE OF CONSULTATION:  Myelodysplastic Syndrome  HISTORY OF PRESENTING ILLNESS:   James Hardy is a wonderful 84 y.o. male who has been referred to [place] by Dr Nicholas Lose for evaluation and management of myelodysplastic syndrome. He is accompanied today by his wife. The pt reports that he is doing well overall.   He has been treated with 510mcg Aranesp every 3 weeks with monthly blood transfusions. The pt reports feeling very weak and not having much energy. He notes that he also has loose bowels frequently in the morning, going 8-10 times each day. He notes an increase in frequency during the springtime that he does not associate with any seasonal behaviors. He has discussed this with his PCP. He notes that at his best, he is still moving his bowels 5-6 times a day. He doesn't endorse any food or dietary associations. He takes a multivitamin, fish oil, and Vitamin D each day. He has taken Protonix for about a year, and has stopped taking iron replacement because of his many transfusions.   He notes that he has using Omeprazole for 15-20 years and has very troublesome GERD symptoms when he doesn't use it.   Most recent lab results (08/13/17) of CBC  is as follows: all values are WNL except for RBC at 2.55, Hgb at 7.6, HCT at 23.1, RDW at 17.6, Monocytes Abs at 1.2k, Retic ct pct at 0.4%, Retic ct abs at 10.2, BUN at 39, Creatinine at 1.55, Total Protein at 6.2.  LDH 08/13/17 is elevated at 317.   On review of systems, pt reports fatigue, shortness of breath, frequent loose stools, stable weight, and denies fevers, chills, night sweats, unexpected weight loss, light headedness, dizziness, abdominal pains, leg swelling, testicular pain or swelling, bone pains, and any other symptoms.   On PMHx the pt reports mild chronic kidney disease,  Myelodysplastic synddrome, sleep apnea, hyperlipidemia, dental implants.  Interval History:   James Hardy returns today for management and evaluation of his MDS.The patient's last visit with [place] was on 09/11/17. He is accompanied today by his wife. The pt reports that he is doing well overall. Verbal consent has been given by the pt for a clinical observer, Mathis Fare, to be present.   The pt saw Dr. Gloriann Loan at [facility] this week, who I referred the pt to. Mr. Ned had a BM Bx yesterday with NGS and will return to care with [name] next week to review results.   The pt reports that he had a good visit with [name]. He presents today feeling very tired and weak, with a HGB of 6.2. He notes that as his HGB drops he begins to feel more short of breath while walking.   Lab results today (10/10/17) of CBC, CMP, and Reticulocytes is as follows: all values are WNL except for WBC at 2.9k, RBC at 2.23, HGB at 6.2, HCT at 18.8, RDW at 17.1, PLT at 97k, Retic ct pct at 0.3%, Retic ct abs at 6.7k, BUN at 37, Creatinine at 1.43, Calcium at 8.6, Total Protein at 5.8, GFR at 42.  On review of systems, pt reports feeling tired and weak, fatigue, SOB while walking, and denies concerns for infection, concerns of bleeding, abdominal pains, and any other symptoms.   MEDICAL HISTORY:  Past Medical History:  Diagnosis Date  . ANGULAR CHEILITIS  04/11/2009   Qualifier: Diagnosis of  By: Joyce Gross    . APHTHOUS ULCERS 12/19/2009   Qualifier: Diagnosis of  By: [name] MD, Bruce    . BENIGN PROSTATIC HYPERTROPHY, HX OF 08/21/2007   Qualifier: Diagnosis of  By: Jerral Ralph    . Cancer ([place])    left leg squamous cell removed  . CERUMEN IMPACTION 04/11/2009   Qualifier: Diagnosis of  By: Joyce Gross    . COLITIS, HX OF 08/21/2007   Qualifier: Diagnosis of  By: Jerral Ralph    . COLONIC POLYPS, HX OF 12/02/2008   Qualifier: Diagnosis of  By: [name] MD, Bruce    .  DIVERTICULOSIS, COLON 08/28/2006   Qualifier: Diagnosis of  By: Jerral Ralph    . GERD 12/02/2008   Qualifier: Diagnosis of  By: [name] MD, Bruce    . H/O [facility] spotted fever   . HYPERLIPIDEMIA 04/11/2009   Qualifier: Diagnosis of  By: Joyce Gross    . INTERNAL HEMORRHOIDS 08/28/2006   Qualifier: Diagnosis of  By: Jerral Ralph    . Nocturia   . OSTEOPOROSIS 08/21/2007   Qualifier: Diagnosis of  By: Jerral Ralph    . Shortness of breath dyspnea    with exertion  . SLEEP APNEA 08/21/2007   Qualifier: Diagnosis of  By: Jerral Ralph      SURGICAL HISTORY: Past Surgical History:  Procedure Laterality Date  . CARDIAC CATHETERIZATION N/A 07/25/2015   Procedure: Right/Left Heart Cath and Coronary Angiography;  Surgeon: Sherren Mocha, MD;  Location: [facility];  Service: Cardiovascular;  Laterality: N/A;  . CATARACT EXTRACTION, BILATERAL    . COLONOSCOPY    . EYE SURGERY Bilateral    "lift"  . [place]  2001  . INGUINAL HERNIA REPAIR Left 01/01/2017   Procedure: LAPAROSCOPIC REPAIR LEFT INGUINAL HERNIA WITH MESH;  Surgeon: [name], MD;  Location: [facility];  Service: General;  Laterality: Left;  . INSERTION OF MESH Left 01/01/2017   Procedure: INSERTION OF MESH;  Surgeon: [name], MD;  Location: [facility];  Service: General;  Laterality: Left;  . SKIN CANCER EXCISION     left leg  . TEE WITHOUT CARDIOVERSION N/A 08/08/2015   Procedure: TRANSESOPHAGEAL ECHOCARDIOGRAM (TEE);  Surgeon: Sherren Mocha, MD;  Location: [place];  Service: Open Heart Surgery;  Laterality: N/A;  . TRANSCATHETER AORTIC VALVE REPLACEMENT, TRANSFEMORAL N/A 08/08/2015   Procedure: TRANSCATHETER AORTIC VALVE REPLACEMENT, TRANSFEMORAL;  Surgeon: Sherren Mocha, MD;  Location: [place];  Service: Open Heart Surgery;  Laterality: N/A;    SOCIAL HISTORY: Social History   Socioeconomic History  . Marital status: Married    Spouse name: Not on file  . Number  of children: Not on file  . Years of education: Not on file  . Highest education level: Not on file  Occupational History  . Not on file  Social Needs  . Financial resource strain: Not on file  . Food insecurity:    Worry: Not on file    Inability: Not on file  . Transportation needs:    Medical: Not on file    Non-medical: Not on file  Tobacco Use  . Smoking status: Never Smoker  . Smokeless tobacco: Never Used  Substance and Sexual Activity  . Alcohol use: No    Alcohol/week: 0.0 oz  . Drug use: No  . Sexual activity: Yes  Lifestyle  . Physical activity:    Days per week: Not on file    Minutes  per session: Not on file  . Stress: Not on file  Relationships  . Social connections:    Talks on phone: Not on file    Gets together: Not on file    Attends religious service: Not on file    Active member of club or organization: Not on file    Attends meetings of clubs or organizations: Not on file    Relationship status: Not on file  . Intimate partner violence:    Fear of current or ex partner: Not on file    Emotionally abused: Not on file    Physically abused: Not on file    Forced sexual activity: Not on file  Other Topics Concern  . Not on file  Social History Narrative  . Not on file    FAMILY HISTORY: Family History  Problem Relation Age of Onset  . Asthma Mother   . Heart disease Other   . Esophageal cancer Other   . Colon cancer Neg Hx   . Pancreatic cancer Neg Hx   . Stomach cancer Neg Hx   . Liver disease Neg Hx     ALLERGIES:  is allergic to neosporin [neomycin-bacitracin zn-polymyx].  MEDICATIONS:  Current Outpatient Medications  Medication Sig Dispense Refill  . Calcium Citrate 250 MG TABS Take 500 mg by mouth daily.     . Cholecalciferol (VITAMIN D3) 5000 units TABS Take 5,000 Units by mouth daily.    . Coenzyme Q10 (CO Q 10) 100 MG CAPS Take 100 mg by mouth daily.    Marland Kitchen doxazosin (CARDURA) 8 MG tablet Take 1 tablet (8 mg total) by mouth at  bedtime. 30 tablet 5  . finasteride (PROSCAR) 5 MG tablet Take 5 mg by mouth daily.    . furosemide (LASIX) 20 MG tablet Take 20 mg by mouth daily.  4  . glucosamine-chondroitin 500-400 MG tablet Take 2 tablets by mouth daily.     Marland Kitchen MAGNESIUM-ZINC PO Take 1 tablet by mouth daily.    . Multiple Vitamin (MULTIVITAMIN) tablet Take 1 tablet by mouth daily.    . NON FORMULARY CPAP machine at bedtime    . Omega-3 Fatty Acids (FISH OIL) 1200 MG CAPS Take 1,200 mg by mouth daily.     . pantoprazole (PROTONIX) 40 MG tablet Take 1 tablet (40 mg total) by mouth daily. 90 tablet 2  . Probiotic Product (PRO-BIOTIC BLEND) CAPS Take 1 capsule by mouth daily.     . tamsulosin (FLOMAX) 0.4 MG CAPS capsule Take 0.4 mg by mouth daily.      No current facility-administered medications for this visit.     REVIEW OF SYSTEMS:    A 10+ POINT REVIEW OF SYSTEMS WAS OBTAINED including neurology, dermatology, psychiatry, cardiac, respiratory, lymph, extremities, GI, GU, Musculoskeletal, constitutional, breasts, reproductive, HEENT.  All pertinent positives are noted in the HPI.  All others are negative.   PHYSICAL EXAMINATION: ECOG PERFORMANCE STATUS: 2 - Symptomatic, <50% confined to bed  . Vitals:   10/10/17 1203  BP: (!) 100/39  Pulse: 77  Resp: 18  Temp: 97.9 F (36.6 C)  SpO2: 100%   Filed Weights   10/10/17 1203  Weight: 139 lb 4.8 oz (63.2 kg)   .Body mass index is 25.48 kg/m.  GENERAL:alert, in no acute distress and comfortable SKIN: no acute rashes, no significant lesions EYES: conjunctiva are pink and non-injected, sclera anicteric OROPHARYNX: MMM, no exudates, no oropharyngeal erythema or ulceration NECK: supple, no JVD LYMPH:  no palpable lymphadenopathy in  the cervical, axillary or inguinal regions LUNGS: clear to auscultation b/l with normal respiratory effort HEART: regular rate & rhythm ABDOMEN:  normoactive bowel sounds , non tender, not distended. No palpable hepatosplenomegaly.    Extremity: no pedal edema PSYCH: alert & oriented x 3 with fluent speech NEURO: no focal motor/sensory deficits   LABORATORY DATA:  I have reviewed the data as listed  . CBC Latest Ref Rng & Units 10/10/2017 09/24/2017 09/11/2017  WBC 4.0 - 10.3 K/uL 2.9(L) 3.3(L) 3.9(L)  Hemoglobin 13.0 - 17.1 g/dL 6.2(LL) 7.5(L) 6.0(LL)  Hematocrit 38.4 - 49.9 % 18.8(L) 22.9(L) 18.4(L)  Platelets 140 - 400 K/uL 97(L) 103(L) 132(L)    . CMP Latest Ref Rng & Units 10/10/2017 09/24/2017 09/11/2017  Glucose 70 - 99 mg/dL 87 100(H) 99  BUN 8 - 23 mg/dL 37(H) 39(H) 37(H)  Creatinine 0.61 - 1.24 mg/dL 1.43(H) 1.56(H) 1.56(H)  Sodium 135 - 145 mmol/L 138 137 137  Potassium 3.5 - 5.1 mmol/L 4.4 4.4 4.5  Chloride 98 - 111 mmol/L 107 107 108  CO2 22 - 32 mmol/L 25 25 22   Calcium 8.9 - 10.3 mg/dL 8.6(L) 8.6(L) 8.4  Total Protein 6.5 - 8.1 g/dL 5.8(L) 5.7(L) 5.9(L)  Total Bilirubin 0.3 - 1.2 mg/dL 0.5 0.5 0.4  Alkaline Phos 38 - 126 U/L 50 41 45  AST 15 - 41 U/L 24 13(L) 15  ALT 0 - 44 U/L 35 15 11   05/13/17 Cytogenetics     05/13/17 BM Bx:      RADIOGRAPHIC STUDIES: I have personally reviewed the radiological images as listed and agreed with the findings in the report. No results found.  ASSESSMENT & PLAN:   84 y.o. male with  1. MDS- NOS presenting primarily as a refractory anemia. With  Isochromosome 17q mutation Reticulocytes have progressively dropped over the last few months 05/13/17 BM Bx revealed pt had more BM than expected with 90% BM cells. No obvious malignant changes identified.   PLAN: Discussed pt labwork today, 10/10/17; HGB at 6., WBC at 2.9k, PLT at 97k, blood chemistries are stable. Now with progressive thrombocytopenia or leukopenia in addition to refractory anemia. -BM is showing signs of panctyopenia with CBC  -Will order 2 units of PRBCs today  -Continue Aranesp every 2 weeks -transfuse prn if hgb <7.5 or if symptomatic -Provided supplemental information about  Vidaza -Discussed the potential side effects of hypomethylating agents like [place] One results - error -[name] sent out [employer] with recent BM Bx that we will follow up on -Will continue labs every 2 weeks and supportive transfusions as needed -Will see pt back in 4 weeks with the results from Dr. Luis Abed work up    Labs and PRBC transfusion 2 units in 2 weeks RTC with Dr Irene Limbo with labs and PRBC transfusion x 2 in 4 weeks   All of the patients questions were answered with apparent satisfaction. The patient knows to call the clinic with any problems, questions or concerns.  The total time spent in the appt was 25 minutes and more than 50% was on counseling and direct patient cares.   Verbal consent has been given by the pt for a clinical observer, Mathis Fare, to be present.    Sullivan Lone MD [place] AAHIVMS [facility] Hematology/Oncology Physician [facility]  (Office):       [phone] (Work cell):  [phone] (Fax):           [phone]  10/10/2017 12:28  PM  I, Schuyler Bain, am acting as a scribe for Dr Irene Limbo.   .I have reviewed the above documentation for accuracy and completeness, and I agree with the above. Brunetta Genera MD

## 2017-10-10 ENCOUNTER — Telehealth: Payer: Self-pay | Admitting: Hematology

## 2017-10-10 ENCOUNTER — Inpatient Hospital Stay: Payer: Medicare Other

## 2017-10-10 ENCOUNTER — Inpatient Hospital Stay (HOSPITAL_BASED_OUTPATIENT_CLINIC_OR_DEPARTMENT_OTHER): Payer: Medicare Other | Admitting: Hematology

## 2017-10-10 ENCOUNTER — Encounter: Payer: Self-pay | Admitting: Hematology

## 2017-10-10 ENCOUNTER — Other Ambulatory Visit: Payer: Self-pay

## 2017-10-10 VITALS — BP 100/39 | HR 77 | Temp 97.9°F | Resp 18 | Ht 62.0 in | Wt 139.3 lb

## 2017-10-10 DIAGNOSIS — D469 Myelodysplastic syndrome, unspecified: Secondary | ICD-10-CM

## 2017-10-10 DIAGNOSIS — D61818 Other pancytopenia: Secondary | ICD-10-CM | POA: Diagnosis not present

## 2017-10-10 DIAGNOSIS — D464 Refractory anemia, unspecified: Secondary | ICD-10-CM

## 2017-10-10 DIAGNOSIS — D630 Anemia in neoplastic disease: Secondary | ICD-10-CM | POA: Diagnosis not present

## 2017-10-10 LAB — CMP (CANCER CENTER ONLY)
ALBUMIN: 3.5 g/dL (ref 3.5–5.0)
ALT: 35 U/L (ref 0–44)
ANION GAP: 6 (ref 5–15)
AST: 24 U/L (ref 15–41)
Alkaline Phosphatase: 50 U/L (ref 38–126)
BUN: 37 mg/dL — ABNORMAL HIGH (ref 8–23)
CHLORIDE: 107 mmol/L (ref 98–111)
CO2: 25 mmol/L (ref 22–32)
Calcium: 8.6 mg/dL — ABNORMAL LOW (ref 8.9–10.3)
Creatinine: 1.43 mg/dL — ABNORMAL HIGH (ref 0.61–1.24)
GFR, Est AFR Am: 49 mL/min — ABNORMAL LOW (ref >60)
GFR, Estimated: 42 mL/min — ABNORMAL LOW (ref >60)
GLUCOSE: 87 mg/dL (ref 70–99)
POTASSIUM: 4.4 mmol/L (ref 3.5–5.1)
Sodium: 138 mmol/L (ref 135–145)
Total Bilirubin: 0.5 mg/dL (ref 0.3–1.2)
Total Protein: 5.8 g/dL — ABNORMAL LOW (ref 6.5–8.1)

## 2017-10-10 LAB — RETICULOCYTES
RBC.: 2.23 MIL/uL — AB (ref 4.20–5.82)
RETIC COUNT ABSOLUTE: 6.7 10*3/uL — AB (ref 34.8–93.9)
Retic Ct Pct: 0.3 % — ABNORMAL LOW (ref 0.8–1.8)

## 2017-10-10 LAB — CBC
HEMATOCRIT: 18.8 % — AB (ref 38.4–49.9)
HEMOGLOBIN: 6.2 g/dL — AB (ref 13.0–17.1)
MCH: 27.8 pg (ref 27.2–33.4)
MCHC: 33 g/dL (ref 32.0–36.0)
MCV: 84.3 fL (ref 79.3–98.0)
Platelets: 97 10*3/uL — ABNORMAL LOW (ref 140–400)
RBC: 2.23 MIL/uL — ABNORMAL LOW (ref 4.20–5.82)
RDW: 17.1 % — ABNORMAL HIGH (ref 11.0–14.6)
WBC: 2.9 10*3/uL — AB (ref 4.0–10.3)

## 2017-10-10 LAB — SAMPLE TO BLOOD BANK

## 2017-10-10 LAB — PREPARE RBC (CROSSMATCH)

## 2017-10-10 MED ORDER — ACETAMINOPHEN 325 MG PO TABS
ORAL_TABLET | ORAL | Status: AC
  Filled 2017-10-10: qty 2

## 2017-10-10 MED ORDER — HEPARIN SOD (PORK) LOCK FLUSH 100 UNIT/ML IV SOLN
500.0000 [IU] | Freq: Every day | INTRAVENOUS | Status: DC | PRN
  Filled 2017-10-10: qty 5

## 2017-10-10 MED ORDER — ACETAMINOPHEN 325 MG PO TABS
650.0000 mg | ORAL_TABLET | Freq: Once | ORAL | Status: AC
  Administered 2017-10-10: 650 mg via ORAL

## 2017-10-10 MED ORDER — DIPHENHYDRAMINE HCL 25 MG PO CAPS
25.0000 mg | ORAL_CAPSULE | Freq: Once | ORAL | Status: AC
  Administered 2017-10-10: 25 mg via ORAL

## 2017-10-10 MED ORDER — DIPHENHYDRAMINE HCL 25 MG PO CAPS
ORAL_CAPSULE | ORAL | Status: AC
  Filled 2017-10-10: qty 1

## 2017-10-10 NOTE — Patient Instructions (Signed)

## 2017-10-10 NOTE — Telephone Encounter (Signed)
Scheduled appt per 7/19 los gave patient aVS and calender per los.  

## 2017-10-11 LAB — BPAM RBC
BLOOD PRODUCT EXPIRATION DATE: 201908092359
Blood Product Expiration Date: 201908092359
ISSUE DATE / TIME: 201907191352
ISSUE DATE / TIME: 201907191352
UNIT TYPE AND RH: 6200
Unit Type and Rh: 6200

## 2017-10-11 LAB — TYPE AND SCREEN
ABO/RH(D): A POS
Antibody Screen: NEGATIVE
Unit division: 0
Unit division: 0

## 2017-10-13 DIAGNOSIS — Z6824 Body mass index (BMI) 24.0-24.9, adult: Secondary | ICD-10-CM | POA: Diagnosis not present

## 2017-10-13 DIAGNOSIS — D699 Hemorrhagic condition, unspecified: Secondary | ICD-10-CM | POA: Diagnosis not present

## 2017-10-13 DIAGNOSIS — C9200 Acute myeloblastic leukemia, not having achieved remission: Secondary | ICD-10-CM | POA: Diagnosis not present

## 2017-10-20 ENCOUNTER — Telehealth: Payer: Self-pay | Admitting: *Deleted

## 2017-10-20 ENCOUNTER — Other Ambulatory Visit: Payer: Self-pay | Admitting: Family Medicine

## 2017-10-20 NOTE — Telephone Encounter (Signed)
Patient called and left a voice mail message stating,"I went to [facility] for a BMBX on 10/09/17 and my hips are sore and getting worse. The doctor couldn't get enough bone marrow on the left side, so, he stuck my right side. Two days later my right side is sore to the touch. It hurts to walk or lie down."  He denied redness, warmth, swelling, no fevers, and no oozing from the procedure site. The [facility] nurse had called him and he told her the same thing. The [facility] nurse was going to talk with [name] and call him back. Instructed him that I would give this message to Dr.Kale. He comes in Thursday, 8/1, for a blood transfusion. He verbalized understanding.

## 2017-10-23 ENCOUNTER — Ambulatory Visit (HOSPITAL_BASED_OUTPATIENT_CLINIC_OR_DEPARTMENT_OTHER): Payer: Medicare Other | Admitting: Medical

## 2017-10-23 ENCOUNTER — Inpatient Hospital Stay: Payer: Medicare Other

## 2017-10-23 ENCOUNTER — Inpatient Hospital Stay: Payer: Medicare Other | Attending: Hematology and Oncology

## 2017-10-23 VITALS — BP 168/59 | HR 61 | Temp 97.9°F | Resp 16

## 2017-10-23 DIAGNOSIS — D464 Refractory anemia, unspecified: Secondary | ICD-10-CM | POA: Insufficient documentation

## 2017-10-23 DIAGNOSIS — D469 Myelodysplastic syndrome, unspecified: Secondary | ICD-10-CM

## 2017-10-23 DIAGNOSIS — D539 Nutritional anemia, unspecified: Secondary | ICD-10-CM

## 2017-10-23 DIAGNOSIS — L739 Follicular disorder, unspecified: Secondary | ICD-10-CM

## 2017-10-23 LAB — CBC WITH DIFFERENTIAL/PLATELET
Basophils Absolute: 0 10*3/uL (ref 0.0–0.1)
Basophils Relative: 1 %
Eosinophils Absolute: 0 10*3/uL (ref 0.0–0.5)
Eosinophils Relative: 0 %
HCT: 21.3 % — ABNORMAL LOW (ref 38.4–49.9)
HEMOGLOBIN: 7 g/dL — AB (ref 13.0–17.1)
LYMPHS PCT: 31 %
Lymphs Abs: 0.7 10*3/uL — ABNORMAL LOW (ref 0.9–3.3)
MCH: 28 pg (ref 27.2–33.4)
MCHC: 32.9 g/dL (ref 32.0–36.0)
MCV: 85.2 fL (ref 79.3–98.0)
Monocytes Absolute: 0.2 10*3/uL (ref 0.1–0.9)
Monocytes Relative: 9 %
NEUTROS ABS: 1.4 10*3/uL — AB (ref 1.5–6.5)
Neutrophils Relative %: 59 %
Platelets: 85 10*3/uL — ABNORMAL LOW (ref 140–400)
RBC: 2.5 MIL/uL — ABNORMAL LOW (ref 4.20–5.82)
RDW: 16.3 % — ABNORMAL HIGH (ref 11.0–14.6)
WBC: 2.3 10*3/uL — AB (ref 4.0–10.3)

## 2017-10-23 LAB — CMP (CANCER CENTER ONLY)
ALT: 23 U/L (ref 0–44)
ANION GAP: 7 (ref 5–15)
AST: 16 U/L (ref 15–41)
Albumin: 3.4 g/dL — ABNORMAL LOW (ref 3.5–5.0)
Alkaline Phosphatase: 51 U/L (ref 38–126)
BUN: 37 mg/dL — ABNORMAL HIGH (ref 8–23)
CHLORIDE: 108 mmol/L (ref 98–111)
CO2: 25 mmol/L (ref 22–32)
Calcium: 8.5 mg/dL — ABNORMAL LOW (ref 8.9–10.3)
Creatinine: 1.49 mg/dL — ABNORMAL HIGH (ref 0.61–1.24)
GFR, EST AFRICAN AMERICAN: 47 mL/min — AB (ref >60)
GFR, Estimated: 40 mL/min — ABNORMAL LOW (ref >60)
Glucose, Bld: 122 mg/dL — ABNORMAL HIGH (ref 70–99)
Potassium: 4.3 mmol/L (ref 3.5–5.1)
Sodium: 140 mmol/L (ref 135–145)
Total Bilirubin: 0.4 mg/dL (ref 0.3–1.2)
Total Protein: 5.9 g/dL — ABNORMAL LOW (ref 6.5–8.1)

## 2017-10-23 LAB — SAMPLE TO BLOOD BANK

## 2017-10-23 LAB — PREPARE RBC (CROSSMATCH)

## 2017-10-23 LAB — RETICULOCYTES: RBC.: 2.5 MIL/uL — ABNORMAL LOW (ref 4.20–5.82)

## 2017-10-23 MED ORDER — DARBEPOETIN ALFA 500 MCG/ML IJ SOSY
PREFILLED_SYRINGE | INTRAMUSCULAR | Status: AC
Start: 2017-10-23 — End: ?
  Filled 2017-10-23: qty 1

## 2017-10-23 MED ORDER — SODIUM CHLORIDE 0.9% IV SOLUTION
250.0000 mL | Freq: Once | INTRAVENOUS | Status: AC
  Administered 2017-10-23: 250 mL via INTRAVENOUS
  Filled 2017-10-23: qty 250

## 2017-10-23 MED ORDER — DARBEPOETIN ALFA 500 MCG/ML IJ SOSY
500.0000 ug | PREFILLED_SYRINGE | Freq: Once | INTRAMUSCULAR | Status: AC
  Administered 2017-10-23: 500 ug via SUBCUTANEOUS

## 2017-10-23 MED ORDER — ACETAMINOPHEN 325 MG PO TABS
650.0000 mg | ORAL_TABLET | Freq: Once | ORAL | Status: AC
  Administered 2017-10-23: 650 mg via ORAL

## 2017-10-23 MED ORDER — DIPHENHYDRAMINE HCL 25 MG PO CAPS
25.0000 mg | ORAL_CAPSULE | Freq: Once | ORAL | Status: AC
  Administered 2017-10-23: 25 mg via ORAL

## 2017-10-23 MED ORDER — ACETAMINOPHEN 325 MG PO TABS
ORAL_TABLET | ORAL | Status: AC
  Filled 2017-10-23: qty 2

## 2017-10-23 MED ORDER — DIPHENHYDRAMINE HCL 25 MG PO CAPS
ORAL_CAPSULE | ORAL | Status: AC
  Filled 2017-10-23: qty 1

## 2017-10-23 NOTE — Progress Notes (Signed)
James Mealy, PA made aware of BP elevation to 168/59. Pt denied dizziness, changes in vision, or HA. PA discussed monitoring of BP and symptoms to look for r/t elevated BP. Pt to call and notify of concerns or changes.

## 2017-10-23 NOTE — Patient Instructions (Signed)

## 2017-10-24 LAB — TYPE AND SCREEN
ABO/RH(D): A POS
Antibody Screen: NEGATIVE
UNIT DIVISION: 0
Unit division: 0

## 2017-10-24 LAB — BPAM RBC
BLOOD PRODUCT EXPIRATION DATE: 201908292359
Blood Product Expiration Date: 201908292359
ISSUE DATE / TIME: 201908011416
ISSUE DATE / TIME: 201908011416
UNIT TYPE AND RH: 6200
Unit Type and Rh: 6200

## 2017-10-24 NOTE — Progress Notes (Signed)
This provider was asked to see James Hardy while he was being transfused today.  James Hardy had one small pustule on his right lateral forearm and 2 small red areas on his left hip at his waistline.  He had been told by someone that he could possibly have shingles.  The areas look as though they could possibly be 3 small areas of folliculitis.  I suggested to James Hardy and his wife that he use triple antibiotic ointment over the areas.  I reassured him that the areas did not look like shingles.  I also instructed him to call or return to the clinic should he notice that the areas have increased in number, size, or if he were to develop progressive erythema, fevers, chills, or sweats.  He and his wife expressed understanding and agreement with this plan.

## 2017-10-29 DIAGNOSIS — N183 Chronic kidney disease, stage 3 (moderate): Secondary | ICD-10-CM | POA: Diagnosis not present

## 2017-10-29 DIAGNOSIS — C9200 Acute myeloblastic leukemia, not having achieved remission: Secondary | ICD-10-CM | POA: Diagnosis not present

## 2017-10-29 DIAGNOSIS — D469 Myelodysplastic syndrome, unspecified: Secondary | ICD-10-CM | POA: Diagnosis not present

## 2017-10-29 DIAGNOSIS — Z006 Encounter for examination for normal comparison and control in clinical research program: Secondary | ICD-10-CM | POA: Diagnosis not present

## 2017-10-30 DIAGNOSIS — D469 Myelodysplastic syndrome, unspecified: Secondary | ICD-10-CM | POA: Diagnosis not present

## 2017-10-30 DIAGNOSIS — I340 Nonrheumatic mitral (valve) insufficiency: Secondary | ICD-10-CM | POA: Diagnosis not present

## 2017-10-30 DIAGNOSIS — C9200 Acute myeloblastic leukemia, not having achieved remission: Secondary | ICD-10-CM | POA: Diagnosis not present

## 2017-10-30 DIAGNOSIS — Z6824 Body mass index (BMI) 24.0-24.9, adult: Secondary | ICD-10-CM | POA: Diagnosis not present

## 2017-10-30 DIAGNOSIS — Z006 Encounter for examination for normal comparison and control in clinical research program: Secondary | ICD-10-CM | POA: Diagnosis not present

## 2017-10-30 DIAGNOSIS — R931 Abnormal findings on diagnostic imaging of heart and coronary circulation: Secondary | ICD-10-CM | POA: Diagnosis not present

## 2017-10-30 DIAGNOSIS — D61818 Other pancytopenia: Secondary | ICD-10-CM | POA: Diagnosis not present

## 2017-10-31 ENCOUNTER — Telehealth: Payer: Self-pay

## 2017-10-31 NOTE — Telephone Encounter (Signed)
Received a call from Waldemar Dickens who works for Dr. Gloriann Loan at [facility]. Caller wanted to inform us that patient is starting an AML clinical protocol and should stop Aranesp injections. Caller stated she informed the patient but wanted to make Dr. Irene Limbo aware as well. Danae Chen stated she will call back on Monday 11/03/17 when [name] is in the office and have him speak with Dr. Irene Limbo. Patient has a lab, MD and blood appointment at the [facility] 11/06/17. Danae Chen stated that patient will be seen frequently at [facility] and they should be able to perform transfusions if needed. Danae Chen stated she will call back on Monday to confirm everything. Dr. Irene Limbo aware.

## 2017-11-03 DIAGNOSIS — Z5111 Encounter for antineoplastic chemotherapy: Secondary | ICD-10-CM | POA: Diagnosis not present

## 2017-11-03 DIAGNOSIS — Z006 Encounter for examination for normal comparison and control in clinical research program: Secondary | ICD-10-CM | POA: Diagnosis not present

## 2017-11-03 DIAGNOSIS — C9200 Acute myeloblastic leukemia, not having achieved remission: Secondary | ICD-10-CM | POA: Diagnosis not present

## 2017-11-03 DIAGNOSIS — Z6824 Body mass index (BMI) 24.0-24.9, adult: Secondary | ICD-10-CM | POA: Diagnosis not present

## 2017-11-04 DIAGNOSIS — Z006 Encounter for examination for normal comparison and control in clinical research program: Secondary | ICD-10-CM | POA: Diagnosis not present

## 2017-11-04 DIAGNOSIS — C9200 Acute myeloblastic leukemia, not having achieved remission: Secondary | ICD-10-CM | POA: Diagnosis not present

## 2017-11-04 DIAGNOSIS — Z5111 Encounter for antineoplastic chemotherapy: Secondary | ICD-10-CM | POA: Diagnosis not present

## 2017-11-05 ENCOUNTER — Telehealth: Payer: Self-pay

## 2017-11-05 DIAGNOSIS — Z5111 Encounter for antineoplastic chemotherapy: Secondary | ICD-10-CM | POA: Diagnosis not present

## 2017-11-05 DIAGNOSIS — Z006 Encounter for examination for normal comparison and control in clinical research program: Secondary | ICD-10-CM | POA: Diagnosis not present

## 2017-11-05 DIAGNOSIS — C9200 Acute myeloblastic leukemia, not having achieved remission: Secondary | ICD-10-CM | POA: Diagnosis not present

## 2017-11-05 NOTE — Telephone Encounter (Signed)
Patient called [place] to cancel his upcoming appointments due to the fact that he is in a clinical trial in [facility] and will be going there from now on.

## 2017-11-06 ENCOUNTER — Inpatient Hospital Stay: Payer: Medicare Other | Admitting: Hematology

## 2017-11-06 ENCOUNTER — Other Ambulatory Visit: Payer: Self-pay | Admitting: Surgery

## 2017-11-06 ENCOUNTER — Telehealth: Payer: Self-pay

## 2017-11-06 ENCOUNTER — Inpatient Hospital Stay: Payer: Medicare Other

## 2017-11-06 DIAGNOSIS — R911 Solitary pulmonary nodule: Secondary | ICD-10-CM

## 2017-11-06 DIAGNOSIS — C9200 Acute myeloblastic leukemia, not having achieved remission: Secondary | ICD-10-CM | POA: Diagnosis not present

## 2017-11-06 DIAGNOSIS — Z006 Encounter for examination for normal comparison and control in clinical research program: Secondary | ICD-10-CM | POA: Diagnosis not present

## 2017-11-06 DIAGNOSIS — Z5111 Encounter for antineoplastic chemotherapy: Secondary | ICD-10-CM | POA: Diagnosis not present

## 2017-11-06 NOTE — Telephone Encounter (Signed)
Verified appointments were canceled. Per 8/15 vm return calls

## 2017-11-07 DIAGNOSIS — Z5112 Encounter for antineoplastic immunotherapy: Secondary | ICD-10-CM | POA: Diagnosis not present

## 2017-11-07 DIAGNOSIS — C9200 Acute myeloblastic leukemia, not having achieved remission: Secondary | ICD-10-CM | POA: Diagnosis not present

## 2017-11-07 DIAGNOSIS — Z006 Encounter for examination for normal comparison and control in clinical research program: Secondary | ICD-10-CM | POA: Diagnosis not present

## 2017-11-07 DIAGNOSIS — Z5111 Encounter for antineoplastic chemotherapy: Secondary | ICD-10-CM | POA: Diagnosis not present

## 2017-11-08 DIAGNOSIS — Z5111 Encounter for antineoplastic chemotherapy: Secondary | ICD-10-CM | POA: Diagnosis not present

## 2017-11-08 DIAGNOSIS — C9200 Acute myeloblastic leukemia, not having achieved remission: Secondary | ICD-10-CM | POA: Diagnosis not present

## 2017-11-08 DIAGNOSIS — Z006 Encounter for examination for normal comparison and control in clinical research program: Secondary | ICD-10-CM | POA: Diagnosis not present

## 2017-11-09 DIAGNOSIS — C9200 Acute myeloblastic leukemia, not having achieved remission: Secondary | ICD-10-CM | POA: Diagnosis not present

## 2017-11-09 DIAGNOSIS — Z5111 Encounter for antineoplastic chemotherapy: Secondary | ICD-10-CM | POA: Diagnosis not present

## 2017-11-09 DIAGNOSIS — Z006 Encounter for examination for normal comparison and control in clinical research program: Secondary | ICD-10-CM | POA: Diagnosis not present

## 2017-11-10 DIAGNOSIS — C9200 Acute myeloblastic leukemia, not having achieved remission: Secondary | ICD-10-CM | POA: Diagnosis not present

## 2017-11-10 DIAGNOSIS — Z006 Encounter for examination for normal comparison and control in clinical research program: Secondary | ICD-10-CM | POA: Diagnosis not present

## 2017-11-10 DIAGNOSIS — Z5111 Encounter for antineoplastic chemotherapy: Secondary | ICD-10-CM | POA: Diagnosis not present

## 2017-11-10 DIAGNOSIS — Z6824 Body mass index (BMI) 24.0-24.9, adult: Secondary | ICD-10-CM | POA: Diagnosis not present

## 2017-11-10 DIAGNOSIS — Z79899 Other long term (current) drug therapy: Secondary | ICD-10-CM | POA: Diagnosis not present

## 2017-11-10 DIAGNOSIS — D61818 Other pancytopenia: Secondary | ICD-10-CM | POA: Diagnosis not present

## 2017-11-10 DIAGNOSIS — N183 Chronic kidney disease, stage 3 (moderate): Secondary | ICD-10-CM | POA: Diagnosis not present

## 2017-11-10 DIAGNOSIS — Z7982 Long term (current) use of aspirin: Secondary | ICD-10-CM | POA: Diagnosis not present

## 2017-11-13 DIAGNOSIS — D225 Melanocytic nevi of trunk: Secondary | ICD-10-CM | POA: Diagnosis not present

## 2017-11-13 DIAGNOSIS — Z85828 Personal history of other malignant neoplasm of skin: Secondary | ICD-10-CM | POA: Diagnosis not present

## 2017-11-13 DIAGNOSIS — D0461 Carcinoma in situ of skin of right upper limb, including shoulder: Secondary | ICD-10-CM | POA: Diagnosis not present

## 2017-11-13 DIAGNOSIS — Z08 Encounter for follow-up examination after completed treatment for malignant neoplasm: Secondary | ICD-10-CM | POA: Diagnosis not present

## 2017-11-17 DIAGNOSIS — Z7982 Long term (current) use of aspirin: Secondary | ICD-10-CM | POA: Diagnosis not present

## 2017-11-17 DIAGNOSIS — Z79899 Other long term (current) drug therapy: Secondary | ICD-10-CM | POA: Diagnosis not present

## 2017-11-17 DIAGNOSIS — D469 Myelodysplastic syndrome, unspecified: Secondary | ICD-10-CM | POA: Diagnosis not present

## 2017-11-17 DIAGNOSIS — C9200 Acute myeloblastic leukemia, not having achieved remission: Secondary | ICD-10-CM | POA: Diagnosis not present

## 2017-11-17 DIAGNOSIS — Z006 Encounter for examination for normal comparison and control in clinical research program: Secondary | ICD-10-CM | POA: Diagnosis not present

## 2017-11-17 DIAGNOSIS — Z713 Dietary counseling and surveillance: Secondary | ICD-10-CM | POA: Diagnosis not present

## 2017-11-17 DIAGNOSIS — D61818 Other pancytopenia: Secondary | ICD-10-CM | POA: Diagnosis not present

## 2017-11-20 DIAGNOSIS — C9200 Acute myeloblastic leukemia, not having achieved remission: Secondary | ICD-10-CM | POA: Diagnosis not present

## 2017-11-25 DIAGNOSIS — N183 Chronic kidney disease, stage 3 (moderate): Secondary | ICD-10-CM | POA: Diagnosis not present

## 2017-11-25 DIAGNOSIS — I517 Cardiomegaly: Secondary | ICD-10-CM | POA: Diagnosis not present

## 2017-11-25 DIAGNOSIS — D61818 Other pancytopenia: Secondary | ICD-10-CM | POA: Diagnosis not present

## 2017-11-25 DIAGNOSIS — C9200 Acute myeloblastic leukemia, not having achieved remission: Secondary | ICD-10-CM | POA: Diagnosis not present

## 2017-11-25 DIAGNOSIS — Z9221 Personal history of antineoplastic chemotherapy: Secondary | ICD-10-CM | POA: Diagnosis not present

## 2017-11-25 DIAGNOSIS — Z79899 Other long term (current) drug therapy: Secondary | ICD-10-CM | POA: Diagnosis not present

## 2017-11-25 DIAGNOSIS — T8089XA Other complications following infusion, transfusion and therapeutic injection, initial encounter: Secondary | ICD-10-CM | POA: Diagnosis not present

## 2017-11-25 DIAGNOSIS — R0600 Dyspnea, unspecified: Secondary | ICD-10-CM | POA: Diagnosis not present

## 2017-11-25 DIAGNOSIS — M2550 Pain in unspecified joint: Secondary | ICD-10-CM | POA: Diagnosis not present

## 2017-11-25 DIAGNOSIS — R918 Other nonspecific abnormal finding of lung field: Secondary | ICD-10-CM | POA: Diagnosis not present

## 2017-11-25 DIAGNOSIS — Z006 Encounter for examination for normal comparison and control in clinical research program: Secondary | ICD-10-CM | POA: Diagnosis not present

## 2017-11-25 DIAGNOSIS — D469 Myelodysplastic syndrome, unspecified: Secondary | ICD-10-CM | POA: Diagnosis not present

## 2017-11-26 DIAGNOSIS — R0600 Dyspnea, unspecified: Secondary | ICD-10-CM | POA: Diagnosis not present

## 2017-11-26 DIAGNOSIS — J189 Pneumonia, unspecified organism: Secondary | ICD-10-CM | POA: Diagnosis not present

## 2017-11-26 DIAGNOSIS — C9200 Acute myeloblastic leukemia, not having achieved remission: Secondary | ICD-10-CM | POA: Diagnosis not present

## 2017-11-26 DIAGNOSIS — R918 Other nonspecific abnormal finding of lung field: Secondary | ICD-10-CM | POA: Diagnosis not present

## 2017-11-26 DIAGNOSIS — J181 Lobar pneumonia, unspecified organism: Secondary | ICD-10-CM | POA: Diagnosis not present

## 2017-11-27 ENCOUNTER — Encounter (HOSPITAL_COMMUNITY): Payer: Self-pay | Admitting: Emergency Medicine

## 2017-11-27 ENCOUNTER — Ambulatory Visit (HOSPITAL_COMMUNITY)
Admission: EM | Admit: 2017-11-27 | Discharge: 2017-11-27 | Disposition: A | Discharge status: Home / Self Care | Payer: Medicare Other | Source: Home / Self Care | Attending: Family Medicine | Admitting: Family Medicine

## 2017-11-27 ENCOUNTER — Inpatient Hospital Stay (HOSPITAL_COMMUNITY)
Admission: EM | Admit: 2017-11-27 | Discharge: 2017-12-04 | DRG: 809 | Disposition: A | Discharge status: Other Acute Inpatient Hospital | Payer: Medicare Other | Attending: Internal Medicine | Admitting: Internal Medicine

## 2017-11-27 ENCOUNTER — Emergency Department (HOSPITAL_COMMUNITY): Payer: Medicare Other

## 2017-11-27 DIAGNOSIS — E785 Hyperlipidemia, unspecified: Secondary | ICD-10-CM | POA: Diagnosis present

## 2017-11-27 DIAGNOSIS — K5730 Diverticulosis of large intestine without perforation or abscess without bleeding: Secondary | ICD-10-CM | POA: Diagnosis present

## 2017-11-27 DIAGNOSIS — K219 Gastro-esophageal reflux disease without esophagitis: Secondary | ICD-10-CM | POA: Diagnosis present

## 2017-11-27 DIAGNOSIS — C9202 Acute myeloblastic leukemia, in relapse: Secondary | ICD-10-CM | POA: Diagnosis not present

## 2017-11-27 DIAGNOSIS — T451X5A Adverse effect of antineoplastic and immunosuppressive drugs, initial encounter: Secondary | ICD-10-CM | POA: Diagnosis present

## 2017-11-27 DIAGNOSIS — Z9842 Cataract extraction status, left eye: Secondary | ICD-10-CM

## 2017-11-27 DIAGNOSIS — D709 Neutropenia, unspecified: Secondary | ICD-10-CM

## 2017-11-27 DIAGNOSIS — N400 Enlarged prostate without lower urinary tract symptoms: Secondary | ICD-10-CM | POA: Diagnosis not present

## 2017-11-27 DIAGNOSIS — R5081 Fever presenting with conditions classified elsewhere: Secondary | ICD-10-CM | POA: Diagnosis present

## 2017-11-27 DIAGNOSIS — Z86010 Personal history of colonic polyps: Secondary | ICD-10-CM

## 2017-11-27 DIAGNOSIS — A419 Sepsis, unspecified organism: Secondary | ICD-10-CM | POA: Diagnosis present

## 2017-11-27 DIAGNOSIS — D61810 Antineoplastic chemotherapy induced pancytopenia: Secondary | ICD-10-CM | POA: Diagnosis not present

## 2017-11-27 DIAGNOSIS — D61818 Other pancytopenia: Secondary | ICD-10-CM | POA: Diagnosis present

## 2017-11-27 DIAGNOSIS — Z9841 Cataract extraction status, right eye: Secondary | ICD-10-CM

## 2017-11-27 DIAGNOSIS — N183 Chronic kidney disease, stage 3 unspecified: Secondary | ICD-10-CM | POA: Diagnosis present

## 2017-11-27 DIAGNOSIS — Z881 Allergy status to other antibiotic agents status: Secondary | ICD-10-CM

## 2017-11-27 DIAGNOSIS — C9200 Acute myeloblastic leukemia, not having achieved remission: Secondary | ICD-10-CM | POA: Diagnosis present

## 2017-11-27 DIAGNOSIS — R350 Frequency of micturition: Secondary | ICD-10-CM | POA: Diagnosis not present

## 2017-11-27 DIAGNOSIS — N401 Enlarged prostate with lower urinary tract symptoms: Secondary | ICD-10-CM | POA: Diagnosis not present

## 2017-11-27 DIAGNOSIS — R079 Chest pain, unspecified: Secondary | ICD-10-CM | POA: Diagnosis not present

## 2017-11-27 DIAGNOSIS — G4733 Obstructive sleep apnea (adult) (pediatric): Secondary | ICD-10-CM | POA: Diagnosis present

## 2017-11-27 DIAGNOSIS — I350 Nonrheumatic aortic (valve) stenosis: Secondary | ICD-10-CM | POA: Diagnosis present

## 2017-11-27 DIAGNOSIS — R0602 Shortness of breath: Secondary | ICD-10-CM | POA: Diagnosis not present

## 2017-11-27 DIAGNOSIS — Z825 Family history of asthma and other chronic lower respiratory diseases: Secondary | ICD-10-CM

## 2017-11-27 DIAGNOSIS — M810 Age-related osteoporosis without current pathological fracture: Secondary | ICD-10-CM | POA: Diagnosis present

## 2017-11-27 LAB — I-STAT CG4 LACTIC ACID, ED: LACTIC ACID, VENOUS: 1.24 mmol/L (ref 0.5–1.9)

## 2017-11-27 LAB — CBC WITH DIFFERENTIAL/PLATELET
BASOS PCT: 1 %
Basophils Absolute: 0 10*3/uL (ref 0.0–0.1)
EOS ABS: 0 10*3/uL (ref 0.0–0.7)
Eosinophils Relative: 0 %
HEMATOCRIT: 27.1 % — AB (ref 39.0–52.0)
Hemoglobin: 8.9 g/dL — ABNORMAL LOW (ref 13.0–17.0)
LYMPHS PCT: 59 %
Lymphs Abs: 0.5 10*3/uL — ABNORMAL LOW (ref 0.7–4.0)
MCH: 27.8 pg (ref 26.0–34.0)
MCHC: 32.8 g/dL (ref 30.0–36.0)
MCV: 84.7 fL (ref 78.0–100.0)
Monocytes Absolute: 0 10*3/uL — ABNORMAL LOW (ref 0.1–1.0)
Monocytes Relative: 6 %
NEUTROS PCT: 34 %
Neutro Abs: 0.2 10*3/uL — ABNORMAL LOW (ref 1.7–7.7)
PLATELETS: 19 10*3/uL — AB (ref 150–400)
RBC: 3.2 MIL/uL — ABNORMAL LOW (ref 4.22–5.81)
RDW: 15.2 % (ref 11.5–15.5)
WBC: 0.7 10*3/uL — CL (ref 4.0–10.5)

## 2017-11-27 LAB — COMPREHENSIVE METABOLIC PANEL
ALK PHOS: 39 U/L (ref 38–126)
ALT: 18 U/L (ref 0–44)
ANION GAP: 7 (ref 5–15)
AST: 22 U/L (ref 15–41)
Albumin: 3.1 g/dL — ABNORMAL LOW (ref 3.5–5.0)
BILIRUBIN TOTAL: 1.2 mg/dL (ref 0.3–1.2)
BUN: 35 mg/dL — ABNORMAL HIGH (ref 8–23)
CO2: 21 mmol/L — ABNORMAL LOW (ref 22–32)
Calcium: 8.1 mg/dL — ABNORMAL LOW (ref 8.9–10.3)
Chloride: 103 mmol/L (ref 98–111)
Creatinine, Ser: 1.5 mg/dL — ABNORMAL HIGH (ref 0.61–1.24)
GFR, EST AFRICAN AMERICAN: 46 mL/min — AB (ref >60)
GFR, EST NON AFRICAN AMERICAN: 40 mL/min — AB (ref >60)
Glucose, Bld: 105 mg/dL — ABNORMAL HIGH (ref 70–99)
POTASSIUM: 4.4 mmol/L (ref 3.5–5.1)
Sodium: 131 mmol/L — ABNORMAL LOW (ref 135–145)
TOTAL PROTEIN: 5.9 g/dL — AB (ref 6.5–8.1)

## 2017-11-27 LAB — URINALYSIS, ROUTINE W REFLEX MICROSCOPIC
Bilirubin Urine: NEGATIVE
Glucose, UA: NEGATIVE mg/dL
HGB URINE DIPSTICK: NEGATIVE
Ketones, ur: NEGATIVE mg/dL
Leukocytes, UA: NEGATIVE
Nitrite: NEGATIVE
Protein, ur: NEGATIVE mg/dL
SPECIFIC GRAVITY, URINE: 1.011 (ref 1.005–1.030)
pH: 5 (ref 5.0–8.0)

## 2017-11-27 LAB — PROTIME-INR
INR: 1.3
Prothrombin Time: 16.1 seconds — ABNORMAL HIGH (ref 11.4–15.2)

## 2017-11-27 MED ORDER — SODIUM CHLORIDE 0.9 % IV SOLN
2.0000 g | Freq: Once | INTRAVENOUS | Status: AC
  Administered 2017-11-27: 2 g via INTRAVENOUS
  Filled 2017-11-27: qty 2

## 2017-11-27 MED ORDER — SODIUM CHLORIDE 0.9 % IV BOLUS
1000.0000 mL | Freq: Once | INTRAVENOUS | Status: AC
  Administered 2017-11-27: 1000 mL via INTRAVENOUS

## 2017-11-27 NOTE — ED Notes (Signed)
Patient is currently on a trial chemo drug for leukemia c/o urinary freq. And generalized bodyaches, spoke with Dr. Mannie Stabile patient is being sent to the Ed.

## 2017-11-27 NOTE — H&P (Signed)
History and Physical    GIUSEPPE DUCHEMIN ELF:[id number] DOB: [birth year] DOA: 11/27/2017  Referring MD/NP/PA:   PCP: [name], MD   Patient coming from:  The patient is coming from home.  At baseline, pt is independent for most of ADL.  Chief Complaint: Fever, chills  HPI: James Hardy is a 84 y.o. male with medical history significant of hyperlipidemia, GERD, colitis, OSA on CPAP, CKD 3, BPH, AML on chemotherapy, who presents with fever and chills.  Pt states that started having fever and chills today. He has hx of BPH, and now has dysuria, burning on urination and difficulty urinating.  Patient denies any cough, shortness of breath, chest pain.  No nausea, vomiting, diarrhea or abdominal pain.  No unilateral weakness.  Patient states that he is receiving clinical trial chemotherapy in [facility], last dose was 3 weeks ago.  He is supposed to start next dose on Monday. Has bruises in arms, but no active bleeding.  Patient does not want to be transferred to [facility], therefore we admitted patient here.  ED Course: pt was found to have pancytopenia (WBC 0.7, hemoglobin 8.9, platelet 19), stable renal function, negative urinalysis, lactic acid of 1.24, INR 1.30, temperature 102.2, no tachycardia, has tachypnea, oxygen saturation 99% on room air, chest x-ray negative for infiltration, showed mild fibrotic change.  Patient is admitted to telemetry bed as inpatient.  Review of Systems:   General: has fevers, chills, no body weight gain, has fatigue HEENT: no blurry vision, hearing changes or sore throat Respiratory: no dyspnea, coughing, wheezing CV: no chest pain, no palpitations GI: no nausea, vomiting, abdominal pain, diarrhea, constipation GU: no dysuria, burning on urination, increased urinary frequency, hematuria  Ext: no leg edema Neuro: no unilateral weakness, numbness, or tingling, no vision change or hearing loss Skin: no rash, no skin tear. Has bruises in arms MSK: No muscle  spasm, no deformity, no limitation of range of movement in spin Heme: No easy bruising.  Travel history: No recent long distant travel.  Allergy:  Allergies  Allergen Reactions  . Neosporin [Neomycin-Bacitracin Zn-Polymyx] Rash    Past Medical History:  Diagnosis Date  . ANGULAR CHEILITIS 04/11/2009   Qualifier: Diagnosis of  By: Joyce Gross    . APHTHOUS ULCERS 12/19/2009   Qualifier: Diagnosis of  By: [name] MD, Bruce    . BENIGN PROSTATIC HYPERTROPHY, HX OF 08/21/2007   Qualifier: Diagnosis of  By: Jerral Ralph    . Cancer (Minor)    left leg squamous cell removed  . CERUMEN IMPACTION 04/11/2009   Qualifier: Diagnosis of  By: Joyce Gross    . COLITIS, HX OF 08/21/2007   Qualifier: Diagnosis of  By: Jerral Ralph    . COLONIC POLYPS, HX OF 12/02/2008   Qualifier: Diagnosis of  By: [name] MD, Bruce    . DIVERTICULOSIS, COLON 08/28/2006   Qualifier: Diagnosis of  By: Jerral Ralph    . GERD 12/02/2008   Qualifier: Diagnosis of  By: [name] MD, Bruce    . H/O [facility] spotted fever   . HYPERLIPIDEMIA 04/11/2009   Qualifier: Diagnosis of  By: Joyce Gross    . INTERNAL HEMORRHOIDS 08/28/2006   Qualifier: Diagnosis of  By: Jerral Ralph    . Nocturia   . OSTEOPOROSIS 08/21/2007   Qualifier: Diagnosis of  By: Jerral Ralph    . Shortness of breath dyspnea    with exertion  . SLEEP APNEA 08/21/2007   Qualifier: Diagnosis of  By: Jeanella Anton  [place]      Past Surgical History:  Procedure Laterality Date  . CARDIAC CATHETERIZATION N/A 07/25/2015   Procedure: Right/Left Heart Cath and Coronary Angiography;  Surgeon: Sherren Mocha, MD;  Location: [facility];  Service: Cardiovascular;  Laterality: N/A;  . CATARACT EXTRACTION, BILATERAL    . COLONOSCOPY    . EYE SURGERY Bilateral    "lift"  . [place]  2001  . INGUINAL HERNIA REPAIR Left 01/01/2017   Procedure: LAPAROSCOPIC REPAIR LEFT INGUINAL HERNIA WITH  MESH;  Surgeon: [name], MD;  Location: [facility];  Service: General;  Laterality: Left;  . INSERTION OF MESH Left 01/01/2017   Procedure: INSERTION OF MESH;  Surgeon: [name], MD;  Location: [facility];  Service: General;  Laterality: Left;  . SKIN CANCER EXCISION     left leg  . TEE WITHOUT CARDIOVERSION N/A 08/08/2015   Procedure: TRANSESOPHAGEAL ECHOCARDIOGRAM (TEE);  Surgeon: Sherren Mocha, MD;  Location: [place];  Service: Open Heart Surgery;  Laterality: N/A;  . TRANSCATHETER AORTIC VALVE REPLACEMENT, TRANSFEMORAL N/A 08/08/2015   Procedure: TRANSCATHETER AORTIC VALVE REPLACEMENT, TRANSFEMORAL;  Surgeon: Sherren Mocha, MD;  Location: [place];  Service: Open Heart Surgery;  Laterality: N/A;    Social History:  reports that he has never smoked. He has never used smokeless tobacco. He reports that he does not drink alcohol or use drugs.  Family History:  Family History  Problem Relation Age of Onset  . Asthma Mother   . Heart disease Other   . Esophageal cancer Other   . Colon cancer Neg Hx   . Pancreatic cancer Neg Hx   . Stomach cancer Neg Hx   . Liver disease Neg Hx      Prior to Admission medications   Medication Sig Start Date End Date Taking? Authorizing Provider  doxazosin (CARDURA) 8 MG tablet Take 1 tablet (8 mg total) by mouth at bedtime. 01/16/16  Yes Burchette, Alinda Sierras, MD  fluconazole (DIFLUCAN) 200 MG tablet Take 400 mg by mouth daily.   Yes [provider]  levofloxacin (LEVAQUIN) 500 MG tablet Take 500 mg by mouth daily.   Yes [provider]  tamsulosin (FLOMAX) 0.4 MG CAPS capsule Take 0.4 mg by mouth at bedtime.  04/05/15  Yes [provider]  traMADol (ULTRAM) 50 MG tablet Take 50 mg by mouth every 12 (twelve) hours as needed for moderate pain.   Yes [provider]  valACYclovir (VALTREX) 500 MG tablet Take 500 mg by mouth daily.   Yes [provider]  NON FORMULARY CPAP machine at bedtime    [provider]   pantoprazole (PROTONIX) 40 MG tablet TAKE 1 TABLET BY MOUTH EVERY DAY Patient not taking: Reported on 11/27/2017 10/20/17   [name], MD    Physical Exam: Vitals:   11/28/17 0000 11/28/17 0038 11/28/17 0103 11/28/17 0106  BP:    (!) 142/53  Pulse:    80  Resp:      Temp:  99.6 F (37.6 C)  100.1 F (37.8 C)  TempSrc:  Oral  Oral  SpO2:    98%  Weight: 61.9 kg  63 kg   Height: 5\' 3"  (1.6 m)  5\' 2"  (1.575 m)    General: Not in acute distress HEENT:       Eyes: PERRL, EOMI, no scleral icterus.       ENT: No discharge from the ears and nose, no pharynx injection, no tonsillar enlargement.  Neck: No JVD, no bruit, no mass felt. Heme: No neck lymph node enlargement. Cardiac: S1/S2, RRR, No murmurs, No gallops or rubs. Respiratory:  No rales, wheezing, rhonchi or rubs. GI: Soft, nondistended, nontender, no rebound pain, no organomegaly, BS present. GU: No hematuria Ext: No pitting leg edema bilaterally. 2+DP/PT pulse bilaterally. Musculoskeletal: No joint deformities, No joint redness or warmth, no limitation of ROM in spin. Skin: No rashes. Has bruises in arms Neuro: Alert, oriented X3, cranial nerves II-XII grossly intact, moves all extremities normally.  Psych: Patient is not psychotic, no suicidal or hemocidal ideation.  Labs on Admission: I have personally reviewed following labs and imaging studies  CBC: Recent Labs  Lab 11/27/17 2039 11/28/17 0224  WBC 0.7* 0.6*  NEUTROABS 0.2*  --   HGB 8.9* 7.7*  HCT 27.1* 23.2*  MCV 84.7 83.5  PLT 19* 16*   Basic Metabolic Panel: Recent Labs  Lab 11/27/17 2039 11/28/17 0224  NA 131* 133*  K 4.4 4.0  CL 103 105  CO2 21* 21*  GLUCOSE 105* 101*  BUN 35* 30*  CREATININE 1.50* 1.37*  CALCIUM 8.1* 7.4*   GFR: Estimated Creatinine Clearance: 29.3 mL/min (A) (by C-G formula based on SCr of 1.37 mg/dL (H)). Liver Function Tests: Recent Labs  Lab 11/27/17 2039  AST 22  ALT 18  ALKPHOS 39  BILITOT 1.2   PROT 5.9*  ALBUMIN 3.1*   No results for input(s): LIPASE, AMYLASE in the last 168 hours. No results for input(s): AMMONIA in the last 168 hours. Coagulation Profile: Recent Labs  Lab 11/27/17 2039  INR 1.30   Cardiac Enzymes: No results for input(s): CKTOTAL, CKMB, CKMBINDEX, TROPONINI in the last 168 hours. BNP (last 3 results) No results for input(s): PROBNP in the last 8760 hours. HbA1C: No results for input(s): HGBA1C in the last 72 hours. CBG: No results for input(s): GLUCAP in the last 168 hours. Lipid Profile: No results for input(s): CHOL, HDL, LDLCALC, TRIG, CHOLHDL, LDLDIRECT in the last 72 hours. Thyroid Function Tests: No results for input(s): TSH, T4TOTAL, FREET4, T3FREE, THYROIDAB in the last 72 hours. Anemia Panel: No results for input(s): VITAMINB12, FOLATE, FERRITIN, TIBC, IRON, RETICCTPCT in the last 72 hours. Urine analysis:    Component Value Date/Time   COLORURINE YELLOW 11/27/2017 2032   APPEARANCEUR CLEAR 11/27/2017 2032   LABSPEC 1.011 11/27/2017 2032   PHURINE 5.0 11/27/2017 2032   GLUCOSEU NEGATIVE 11/27/2017 2032   HGBUR NEGATIVE 11/27/2017 2032   BILIRUBINUR NEGATIVE 11/27/2017 2032   BILIRUBINUR neg 11/15/2015 1635   KETONESUR NEGATIVE 11/27/2017 2032   PROTEINUR NEGATIVE 11/27/2017 2032   UROBILINOGEN 0.2 11/15/2015 1635   NITRITE NEGATIVE 11/27/2017 2032   LEUKOCYTESUR NEGATIVE 11/27/2017 2032   Sepsis Labs: @LABRCNTIP (procalcitonin:4,lacticidven:4) )No results found for this or any previous visit (from the past 240 hour(s)).   Radiological Exams on Admission: Dg Chest 2 View  Result Date: 11/27/2017 CLINICAL DATA:  Chest pain and shortness of breath. EXAM: CHEST - 2 VIEW COMPARISON:  Right ribs 08/22/2016, chest 07/19/2016 FINDINGS: Borderline heart size with normal pulmonary vascularity. Cardiac valve prosthesis. Slight fibrosis in the lung bases. No airspace disease or consolidation. Blunting of the left costophrenic angle may  represent pleural thickening or a minimal pleural effusion. No pneumothorax. Mediastinal contours appear intact. Calcification of the aorta. Degenerative changes in the spine and shoulders. IMPRESSION: Slight fibrosis in the lung bases. Fluid or thickened pleura blunting the left costophrenic angle. No evidence of active pulmonary disease. Electronically Signed  By: Lucienne Capers M.D.   On: 11/27/2017 21:06     EKG: Not done in ED, will get one.    Assessment/Plan Principal Problem:   Neutropenia with fever (HCC) Active Problems:   Benign prostatic hyperplasia   AML (acute myelogenous leukemia) (HCC)   Sepsis ([place])   CKD (chronic kidney disease), stage III (HCC)   Pancytopenia ([place])   Sepsis due to neutropenia with fever [facility]): Patient meets criteria for sepsis with neutropenia, tachypnea and fever.  Currently hemodynamically stable.  Lactic acid is normal.  Source of infection is not clear.  Urinalysis negative.  Chest x-ray negative. -Admit to telemetry bed as inpatient - Vancomycin and cefepime a - Blood culture, urine culture -Respiratory virus panel -will get Procalcitonin and trend lactic acid levels per sepsis protocol. -IVF: 2.5L of NS bolus in ED, followed by 100 cc/h   Benign prostatic hyperplasia: Patient has difficulty urinating -Proscar, Flomax -Place Foley catheter  AML (acute myelogenous leukemia) ([place]): Did not reach remission.  Patient is receiving clinical trial chemotherapy in [facility]. -Follow-up by with oncology in [facility]  CKD (chronic kidney disease), stage III ([place]): stable.  Baseline creatinine 1.4-1.5.  His creatinine is 1.50, BUN 35. -Follow-up renal function by BMP  Pancytopenia ([place]): due to AML and chemotherapy. -type screen -transfuse for Hgb<7.0 or platelet drop further or develop tendency of bleeding  OSA: -CPAP  Inpatient status:  # Patient requires inpatient status due to high intensity of service, high risk for further deterioration and high  frequency of surveillance required.  I certify that at the point of admission it is my clinical judgment that the patient will require inpatient hospital care spanning beyond 2 midnights from the point of admission.  . This patient has multiple chronic comorbidities including hyperlipidemia, GERD, colitis, OSA on CPAP, CKD 3, BPH, AML on chemotherapy,  . Now patient has presenting symptoms include fever and chills. . The worrisome physical exam findings include fever . The initial radiographic and laboratory data are worrisome because of sepsis and neutropenia, pancytopenia.  Due to immunosuppression, patient is not high risk of deteriorating . Current medical needs: please see my assessment and plan       DVT ppx: SCD Code Status: Full code Family Communication:  Yes, patient's wife, son, daughter-in-law    at bed side Disposition Plan:  Anticipate discharge back to previous home environment Consults called:  none Admission status:  Inpatient/tele    Date of Service 11/28/2017    Ivor Costa [facility] Pager [phone]  If 7PM-7AM, please contact night-coverage [email or web address] Password [email or web address] 11/28/2017, 4:27 AM

## 2017-11-27 NOTE — ED Triage Notes (Signed)
Pt states his PCP told him not to take any fever reducers?

## 2017-11-27 NOTE — ED Triage Notes (Signed)
Pt reports urinary frequency X1 day, states he is having to urinate every 10-15 minutes. Pt is newly dx with AML, seen at [facility]. PCP informed him to come to nearest ED and suggested admission for neutropenic fevers.

## 2017-11-27 NOTE — ED Provider Notes (Addendum)
[place] EMERGENCY DEPARTMENT Provider Note   CSN: [id number] Arrival date & time: 11/27/17  2006     History   Chief Complaint Chief Complaint  Patient presents with  . Urinary Frequency    HPI James Hardy is a 84 y.o. male who is currently enrolled in a phase 2 study of Azacitidine in combination with pembrolizumab and relapsed/refractory acute myeloid leukemia patients and and newly diagnosed older than 65 years AML patients.  Patient presenting today for urinary frequency and dysuria that began today.  Patient states that he is needing to use the restroom every 10 to 15 minutes and having burning when he urinates.  Patient states that he is also developed a fever today.  Patient called his primary care doctor who advised that he be seen at the nearest to emergency department for neutropenic fevers.  At time of my evaluation patient states that he has diffuse joint pain that he is unsure if is related to his chemotherapy.  Patient denying chest pain, shortness of breath, abdominal pain, nausea/vomiting, diarrhea testicular swelling or pain or penile discharge.  HPI  Past Medical History:  Diagnosis Date  . ANGULAR CHEILITIS 04/11/2009   Qualifier: Diagnosis of  By: Joyce Gross    . APHTHOUS ULCERS 12/19/2009   Qualifier: Diagnosis of  By: [name] MD, Bruce    . BENIGN PROSTATIC HYPERTROPHY, HX OF 08/21/2007   Qualifier: Diagnosis of  By: Jerral Ralph    . Cancer ([place])    left leg squamous cell removed  . CERUMEN IMPACTION 04/11/2009   Qualifier: Diagnosis of  By: Joyce Gross    . COLITIS, HX OF 08/21/2007   Qualifier: Diagnosis of  By: Jerral Ralph    . COLONIC POLYPS, HX OF 12/02/2008   Qualifier: Diagnosis of  By: [name] MD, Bruce    . DIVERTICULOSIS, COLON 08/28/2006   Qualifier: Diagnosis of  By: Jerral Ralph    . GERD 12/02/2008   Qualifier: Diagnosis of  By: [name] MD, Bruce    . H/O [facility] spotted  fever   . HYPERLIPIDEMIA 04/11/2009   Qualifier: Diagnosis of  By: Joyce Gross    . INTERNAL HEMORRHOIDS 08/28/2006   Qualifier: Diagnosis of  By: Jerral Ralph    . Nocturia   . OSTEOPOROSIS 08/21/2007   Qualifier: Diagnosis of  By: Jerral Ralph    . Shortness of breath dyspnea    with exertion  . SLEEP APNEA 08/21/2007   Qualifier: Diagnosis of  By: Jerral Ralph      Patient Active Problem List   Diagnosis Date Noted  . MDS (myelodysplastic syndrome) ([place]) 06/03/2017  . S/P inguinal hernia repair 01/01/2017  . Macrocytic anemia 04/16/2016  . Lung nodule seen on imaging study 08/18/2015  . Severe aortic stenosis   . Aortic stenosis   . Angina pectoris ([place])   . Hyperlipidemia 04/11/2009  . GERD 12/02/2008  . Sleep apnea 08/21/2007  . BPH (benign prostatic hypertrophy) 08/21/2007    Past Surgical History:  Procedure Laterality Date  . CARDIAC CATHETERIZATION N/A 07/25/2015   Procedure: Right/Left Heart Cath and Coronary Angiography;  Surgeon: Sherren Mocha, MD;  Location: [place] CV LAB;  Service: Cardiovascular;  Laterality: N/A;  . CATARACT EXTRACTION, BILATERAL    . COLONOSCOPY    . EYE SURGERY Bilateral    "lift"  . [place]  2001  . INGUINAL HERNIA REPAIR Left 01/01/2017   Procedure: LAPAROSCOPIC REPAIR LEFT INGUINAL HERNIA WITH MESH;  Surgeon: Redmond Pulling,  [name], MD;  Location: [facility];  Service: General;  Laterality: Left;  . INSERTION OF MESH Left 01/01/2017   Procedure: INSERTION OF MESH;  Surgeon: [name], MD;  Location: [facility];  Service: General;  Laterality: Left;  . SKIN CANCER EXCISION     left leg  . TEE WITHOUT CARDIOVERSION N/A 08/08/2015   Procedure: TRANSESOPHAGEAL ECHOCARDIOGRAM (TEE);  Surgeon: Sherren Mocha, MD;  Location: [facility];  Service: Open Heart Surgery;  Laterality: N/A;  . TRANSCATHETER AORTIC VALVE REPLACEMENT, TRANSFEMORAL N/A 08/08/2015   Procedure: TRANSCATHETER AORTIC VALVE REPLACEMENT, TRANSFEMORAL;   Surgeon: Sherren Mocha, MD;  Location: [place];  Service: Open Heart Surgery;  Laterality: N/A;        Home Medications    Prior to Admission medications   Medication Sig Start Date End Date Taking? Authorizing Provider  doxazosin (CARDURA) 8 MG tablet Take 1 tablet (8 mg total) by mouth at bedtime. 01/16/16  Yes Burchette, Alinda Sierras, MD  fluconazole (DIFLUCAN) 200 MG tablet Take 400 mg by mouth daily.   Yes [provider]  levofloxacin (LEVAQUIN) 500 MG tablet Take 500 mg by mouth daily.   Yes [provider]  tamsulosin (FLOMAX) 0.4 MG CAPS capsule Take 0.4 mg by mouth at bedtime.  04/05/15  Yes [provider]  traMADol (ULTRAM) 50 MG tablet Take 50 mg by mouth every 12 (twelve) hours as needed for moderate pain.   Yes [provider]  valACYclovir (VALTREX) 500 MG tablet Take 500 mg by mouth daily.   Yes [provider]  NON FORMULARY CPAP machine at bedtime    [provider]  pantoprazole (PROTONIX) 40 MG tablet TAKE 1 TABLET BY MOUTH EVERY DAY Patient not taking: Reported on 11/27/2017 10/20/17   [name], MD    Family History Family History  Problem Relation Age of Onset  . Asthma Mother   . Heart disease Other   . Esophageal cancer Other   . Colon cancer Neg Hx   . Pancreatic cancer Neg Hx   . Stomach cancer Neg Hx   . Liver disease Neg Hx     Social History Social History   Tobacco Use  . Smoking status: Never Smoker  . Smokeless tobacco: Never Used  Substance Use Topics  . Alcohol use: No    Alcohol/week: 0.0 standard drinks  . Drug use: No     Allergies   Neosporin [neomycin-bacitracin zn-polymyx]   Review of Systems Review of Systems  Constitutional: Positive for fever. Negative for chills.  HENT: Negative.  Negative for rhinorrhea and sore throat.   Eyes: Negative.  Negative for visual disturbance.  Respiratory: Negative.  Negative for cough and shortness of breath.   Cardiovascular:  Negative.  Negative for chest pain.  Gastrointestinal: Negative.  Negative for abdominal pain, blood in stool, diarrhea, nausea and vomiting.  Genitourinary: Positive for dysuria and frequency. Negative for flank pain, hematuria, scrotal swelling and testicular pain.  Musculoskeletal: Positive for arthralgias. Negative for myalgias.  Skin: Negative.  Negative for rash.  Allergic/Immunologic: Positive for immunocompromised state.  Neurological: Negative.  Negative for dizziness, weakness and headaches.     Physical Exam Updated Vital Signs BP (!) 148/62   Pulse 71   Temp (!) 102.2 F (39 C) (Oral)   Resp (!) 27   SpO2 99%   Physical Exam  Constitutional: He is oriented to person, place, and time. He appears well-developed and well-nourished. No distress.  HENT:  Head: Normocephalic and atraumatic.  Right Ear: External ear normal.  Left Ear: External ear normal.  Nose: Nose normal.  Mouth/Throat: Oropharynx is clear and moist.  Eyes: Pupils are equal, round, and reactive to light. Conjunctivae and EOM are normal.  Neck: Trachea normal, normal range of motion, full passive range of motion without pain and phonation normal. Neck supple. No tracheal deviation present.  Cardiovascular: Normal rate, regular rhythm, normal heart sounds and intact distal pulses.  Pulmonary/Chest: Effort normal and breath sounds normal. No respiratory distress. He has no wheezes.  Abdominal: Soft. Normal appearance and bowel sounds are normal. There is no tenderness. There is no rebound and no guarding.  Musculoskeletal: Normal range of motion. He exhibits no deformity.  Neurological: He is alert and oriented to person, place, and time. He has normal strength. No cranial nerve deficit or sensory deficit.  Skin: Skin is warm and dry. Capillary refill takes less than 2 seconds.  Psychiatric: He has a normal mood and affect. His behavior is normal.     ED Treatments / Results  Labs (all labs ordered are  listed, but only abnormal results are displayed) Labs Reviewed  COMPREHENSIVE METABOLIC PANEL - Abnormal; Notable for the following components:      Result Value   Sodium 131 (*)    CO2 21 (*)    Glucose, Bld 105 (*)    BUN 35 (*)    Creatinine, Ser 1.50 (*)    Calcium 8.1 (*)    Total Protein 5.9 (*)    Albumin 3.1 (*)    GFR calc non Af Amer 40 (*)    GFR calc Af Amer 46 (*)    All other components within normal limits  CBC WITH DIFFERENTIAL/PLATELET - Abnormal; Notable for the following components:   WBC 0.7 (*)    RBC 3.20 (*)    Hemoglobin 8.9 (*)    HCT 27.1 (*)    Platelets 19 (*)    Neutro Abs 0.2 (*)    Lymphs Abs 0.5 (*)    Monocytes Absolute 0.0 (*)    All other components within normal limits  PROTIME-INR - Abnormal; Notable for the following components:   Prothrombin Time 16.1 (*)    All other components within normal limits  CULTURE, BLOOD (ROUTINE X 2)  CULTURE, BLOOD (ROUTINE X 2)  URINE CULTURE  URINALYSIS, ROUTINE W REFLEX MICROSCOPIC  I-STAT CG4 LACTIC ACID, ED  I-STAT CG4 LACTIC ACID, ED    EKG None  Radiology Dg Chest 2 View  Result Date: 11/27/2017 CLINICAL DATA:  Chest pain and shortness of breath. EXAM: CHEST - 2 VIEW COMPARISON:  Right ribs 08/22/2016, chest 07/19/2016 FINDINGS: Borderline heart size with normal pulmonary vascularity. Cardiac valve prosthesis. Slight fibrosis in the lung bases. No airspace disease or consolidation. Blunting of the left costophrenic angle may represent pleural thickening or a minimal pleural effusion. No pneumothorax. Mediastinal contours appear intact. Calcification of the aorta. Degenerative changes in the spine and shoulders. IMPRESSION: Slight fibrosis in the lung bases. Fluid or thickened pleura blunting the left costophrenic angle. No evidence of active pulmonary disease. Electronically Signed   By: Lucienne Capers M.D.   On: 11/27/2017 21:06    Procedures Procedures (including critical care  time)  Medications Ordered in ED Medications  ceFEPIme (MAXIPIME) 2 g in sodium chloride 0.9 % 100 mL IVPB (2 g Intravenous New Bag/Given 11/27/17 2359)  sodium chloride 0.9 % bolus 1,000 mL (1,000 mLs Intravenous New Bag/Given 11/27/17 2358)     Initial Impression /  Assessment and Plan / ED Course  I have reviewed the triage vital signs and the nursing notes.  Pertinent labs & imaging results that were available during my care of the patient were reviewed by me and considered in my medical decision making (see chart for details).  Clinical Course as of Sep 06 0002  Thu Nov 27, 2017  2240 I have contacted [facility] oncology via phone [phone] clinical trial Dr. Emogene Morgan regarding this patient.  He has informed me that there are no contraindications for antibiotics while the patient participates in this trial.   [BM]  2339 Consult called to hospitalist Dr. Blaine Hamper who is admitting patient to the hospital for neutropenic fever.   [BM]    Clinical Course User Index [BM] Deliah Boston, PA-C   84 year old male currently undergoing chemotherapy presenting with fever, dysuria and urinary frequency that began this afternoon.  Patient informed by PCP to report to nearest emergency room.  On arrival patient is febrile to 102.2.   Blood cultures drawn and pending Urinalysis within normal limits CBC shows WBC 0.7, pancytopenia CMP with multiple abnormalities Prothrombin time 16.1 Urine culture pending Chest x-ray with fibrosis of lung bases, fluid or thickened pleura blunting left costophrenic angle.  No evidence of active pulmonary disease  IV fluids begun.  Consult with [facility] oncology clinical trial Dr. Emogene Morgan who advises that there is no contraindication to antibiotics while patient is taking his chemotherapy.  IV cefepime begun in emergency department.  Consult called to admit patient to hospital with Dr. Blaine Hamper.  Patient and family wish to complete care and work-up here at [facility] they state  they do not wish to seek transfer to [facility] at this time.  Patient has been admitted by Dr. Blaine Hamper to the hospital for neutropenic fever.  Patient case discussed with and patient seen by Dr. Ashok Cordia who agrees with admission at this time.  Final Clinical Impressions(s) / ED Diagnoses   Final diagnoses:  Neutropenic fever [facility])    ED Discharge Orders    None       Deliah Boston, PA-C 11/28/17 0008    Deliah Boston, PA-C 11/28/17 0010    Lajean Saver, MD 11/28/17 [phone]

## 2017-11-28 DIAGNOSIS — A419 Sepsis, unspecified organism: Secondary | ICD-10-CM | POA: Diagnosis present

## 2017-11-28 DIAGNOSIS — E785 Hyperlipidemia, unspecified: Secondary | ICD-10-CM | POA: Diagnosis present

## 2017-11-28 DIAGNOSIS — R5081 Fever presenting with conditions classified elsewhere: Secondary | ICD-10-CM | POA: Diagnosis present

## 2017-11-28 DIAGNOSIS — C9200 Acute myeloblastic leukemia, not having achieved remission: Secondary | ICD-10-CM | POA: Diagnosis present

## 2017-11-28 DIAGNOSIS — D709 Neutropenia, unspecified: Secondary | ICD-10-CM | POA: Diagnosis present

## 2017-11-28 DIAGNOSIS — R21 Rash and other nonspecific skin eruption: Secondary | ICD-10-CM | POA: Diagnosis not present

## 2017-11-28 DIAGNOSIS — L740 Miliaria rubra: Secondary | ICD-10-CM | POA: Diagnosis not present

## 2017-11-28 DIAGNOSIS — G4733 Obstructive sleep apnea (adult) (pediatric): Secondary | ICD-10-CM | POA: Diagnosis present

## 2017-11-28 DIAGNOSIS — N179 Acute kidney failure, unspecified: Secondary | ICD-10-CM | POA: Diagnosis not present

## 2017-11-28 DIAGNOSIS — J181 Lobar pneumonia, unspecified organism: Secondary | ICD-10-CM | POA: Diagnosis not present

## 2017-11-28 DIAGNOSIS — N183 Chronic kidney disease, stage 3 unspecified: Secondary | ICD-10-CM | POA: Diagnosis present

## 2017-11-28 DIAGNOSIS — N401 Enlarged prostate with lower urinary tract symptoms: Secondary | ICD-10-CM | POA: Diagnosis present

## 2017-11-28 DIAGNOSIS — K219 Gastro-esophageal reflux disease without esophagitis: Secondary | ICD-10-CM | POA: Diagnosis present

## 2017-11-28 DIAGNOSIS — Z86010 Personal history of colonic polyps: Secondary | ICD-10-CM | POA: Diagnosis not present

## 2017-11-28 DIAGNOSIS — J90 Pleural effusion, not elsewhere classified: Secondary | ICD-10-CM | POA: Diagnosis not present

## 2017-11-28 DIAGNOSIS — Z9842 Cataract extraction status, left eye: Secondary | ICD-10-CM | POA: Diagnosis not present

## 2017-11-28 DIAGNOSIS — C9202 Acute myeloblastic leukemia, in relapse: Secondary | ICD-10-CM | POA: Diagnosis present

## 2017-11-28 DIAGNOSIS — M810 Age-related osteoporosis without current pathological fracture: Secondary | ICD-10-CM | POA: Diagnosis present

## 2017-11-28 DIAGNOSIS — N400 Enlarged prostate without lower urinary tract symptoms: Secondary | ICD-10-CM | POA: Diagnosis not present

## 2017-11-28 DIAGNOSIS — Z9841 Cataract extraction status, right eye: Secondary | ICD-10-CM | POA: Diagnosis not present

## 2017-11-28 DIAGNOSIS — R591 Generalized enlarged lymph nodes: Secondary | ICD-10-CM | POA: Diagnosis not present

## 2017-11-28 DIAGNOSIS — D61810 Antineoplastic chemotherapy induced pancytopenia: Secondary | ICD-10-CM | POA: Diagnosis present

## 2017-11-28 DIAGNOSIS — Z881 Allergy status to other antibiotic agents status: Secondary | ICD-10-CM | POA: Diagnosis not present

## 2017-11-28 DIAGNOSIS — S0990XA Unspecified injury of head, initial encounter: Secondary | ICD-10-CM | POA: Diagnosis not present

## 2017-11-28 DIAGNOSIS — R918 Other nonspecific abnormal finding of lung field: Secondary | ICD-10-CM | POA: Diagnosis not present

## 2017-11-28 DIAGNOSIS — R350 Frequency of micturition: Secondary | ICD-10-CM | POA: Diagnosis present

## 2017-11-28 DIAGNOSIS — T451X5A Adverse effect of antineoplastic and immunosuppressive drugs, initial encounter: Secondary | ICD-10-CM | POA: Diagnosis present

## 2017-11-28 DIAGNOSIS — I350 Nonrheumatic aortic (valve) stenosis: Secondary | ICD-10-CM | POA: Diagnosis present

## 2017-11-28 DIAGNOSIS — D61818 Other pancytopenia: Secondary | ICD-10-CM

## 2017-11-28 DIAGNOSIS — E873 Alkalosis: Secondary | ICD-10-CM | POA: Diagnosis not present

## 2017-11-28 DIAGNOSIS — K5730 Diverticulosis of large intestine without perforation or abscess without bleeding: Secondary | ICD-10-CM | POA: Diagnosis present

## 2017-11-28 DIAGNOSIS — Z825 Family history of asthma and other chronic lower respiratory diseases: Secondary | ICD-10-CM | POA: Diagnosis not present

## 2017-11-28 LAB — RESPIRATORY PANEL BY PCR
ADENOVIRUS-RVPPCR: NOT DETECTED
Bordetella pertussis: NOT DETECTED
CORONAVIRUS HKU1-RVPPCR: NOT DETECTED
CORONAVIRUS NL63-RVPPCR: NOT DETECTED
CORONAVIRUS OC43-RVPPCR: NOT DETECTED
Chlamydophila pneumoniae: NOT DETECTED
Coronavirus 229E: NOT DETECTED
INFLUENZA A-RVPPCR: NOT DETECTED
Influenza B: NOT DETECTED
MYCOPLASMA PNEUMONIAE-RVPPCR: NOT DETECTED
Metapneumovirus: NOT DETECTED
PARAINFLUENZA VIRUS 1-RVPPCR: NOT DETECTED
PARAINFLUENZA VIRUS 4-RVPPCR: NOT DETECTED
Parainfluenza Virus 2: NOT DETECTED
Parainfluenza Virus 3: NOT DETECTED
Respiratory Syncytial Virus: NOT DETECTED
Rhinovirus / Enterovirus: NOT DETECTED

## 2017-11-28 LAB — BASIC METABOLIC PANEL
ANION GAP: 7 (ref 5–15)
BUN: 30 mg/dL — ABNORMAL HIGH (ref 8–23)
CALCIUM: 7.4 mg/dL — AB (ref 8.9–10.3)
CO2: 21 mmol/L — ABNORMAL LOW (ref 22–32)
Chloride: 105 mmol/L (ref 98–111)
Creatinine, Ser: 1.37 mg/dL — ABNORMAL HIGH (ref 0.61–1.24)
GFR, EST AFRICAN AMERICAN: 52 mL/min — AB (ref >60)
GFR, EST NON AFRICAN AMERICAN: 45 mL/min — AB (ref >60)
GLUCOSE: 101 mg/dL — AB (ref 70–99)
Potassium: 4 mmol/L (ref 3.5–5.1)
Sodium: 133 mmol/L — ABNORMAL LOW (ref 135–145)

## 2017-11-28 LAB — CBC
HCT: 23.2 % — ABNORMAL LOW (ref 39.0–52.0)
HEMOGLOBIN: 7.7 g/dL — AB (ref 13.0–17.0)
MCH: 27.7 pg (ref 26.0–34.0)
MCHC: 33.2 g/dL (ref 30.0–36.0)
MCV: 83.5 fL (ref 78.0–100.0)
Platelets: 16 10*3/uL — CL (ref 150–400)
RBC: 2.78 MIL/uL — AB (ref 4.22–5.81)
RDW: 15.1 % (ref 11.5–15.5)
WBC: 0.6 10*3/uL — CL (ref 4.0–10.5)

## 2017-11-28 LAB — LACTIC ACID, PLASMA: LACTIC ACID, VENOUS: 1.1 mmol/L (ref 0.5–1.9)

## 2017-11-28 LAB — TYPE AND SCREEN
ABO/RH(D): A POS
Antibody Screen: NEGATIVE

## 2017-11-28 LAB — PATHOLOGIST SMEAR REVIEW

## 2017-11-28 LAB — PROCALCITONIN

## 2017-11-28 MED ORDER — VALACYCLOVIR HCL 500 MG PO TABS
500.0000 mg | ORAL_TABLET | Freq: Every day | ORAL | Status: DC
  Administered 2017-11-28 – 2017-12-03 (×6): 500 mg via ORAL
  Filled 2017-11-28 (×6): qty 1

## 2017-11-28 MED ORDER — FINASTERIDE 5 MG PO TABS
5.0000 mg | ORAL_TABLET | Freq: Every day | ORAL | Status: DC
  Administered 2017-11-28 – 2017-12-03 (×6): 5 mg via ORAL
  Filled 2017-11-28 (×6): qty 1

## 2017-11-28 MED ORDER — ONDANSETRON HCL 4 MG PO TABS: 4.0000 mg | ORAL_TABLET | Freq: Four times a day (QID) | ORAL | Status: DC | PRN

## 2017-11-28 MED ORDER — ZOLPIDEM TARTRATE 5 MG PO TABS
5.0000 mg | ORAL_TABLET | Freq: Every evening | ORAL | Status: DC | PRN
  Administered 2017-12-02: 5 mg via ORAL
  Filled 2017-11-28 (×3): qty 1

## 2017-11-28 MED ORDER — TAMSULOSIN HCL 0.4 MG PO CAPS
0.4000 mg | ORAL_CAPSULE | Freq: Every day | ORAL | Status: DC
  Administered 2017-11-28 – 2017-12-03 (×7): 0.4 mg via ORAL
  Filled 2017-11-28 (×7): qty 1

## 2017-11-28 MED ORDER — VANCOMYCIN HCL 10 G IV SOLR
1250.0000 mg | Freq: Once | INTRAVENOUS | Status: AC
  Administered 2017-11-28: 1250 mg via INTRAVENOUS
  Filled 2017-11-28 (×2): qty 1250

## 2017-11-28 MED ORDER — VANCOMYCIN HCL IN DEXTROSE 750-5 MG/150ML-% IV SOLN
750.0000 mg | INTRAVENOUS | Status: DC
  Administered 2017-11-28 – 2017-12-01 (×3): 750 mg via INTRAVENOUS
  Filled 2017-11-28 (×3): qty 150

## 2017-11-28 MED ORDER — ACETAMINOPHEN 650 MG RE SUPP: 650.0000 mg | Freq: Four times a day (QID) | RECTAL | Status: DC | PRN

## 2017-11-28 MED ORDER — SENNOSIDES-DOCUSATE SODIUM 8.6-50 MG PO TABS: 1.0000 | ORAL_TABLET | Freq: Every evening | ORAL | Status: DC | PRN

## 2017-11-28 MED ORDER — SODIUM CHLORIDE 0.9 % IV SOLN
INTRAVENOUS | Status: DC
  Administered 2017-11-28: 05:00:00 via INTRAVENOUS

## 2017-11-28 MED ORDER — SODIUM CHLORIDE 0.9 % IV SOLN
2.0000 g | INTRAVENOUS | Status: DC
  Administered 2017-11-28 – 2017-12-03 (×6): 2 g via INTRAVENOUS
  Filled 2017-11-28 (×6): qty 2

## 2017-11-28 MED ORDER — ONDANSETRON HCL 4 MG/2ML IJ SOLN: 4.0000 mg | Freq: Four times a day (QID) | INTRAMUSCULAR | Status: DC | PRN

## 2017-11-28 MED ORDER — FLUCONAZOLE 200 MG PO TABS
400.0000 mg | ORAL_TABLET | Freq: Every day | ORAL | Status: DC
  Administered 2017-11-28 – 2017-12-03 (×6): 400 mg via ORAL
  Filled 2017-11-28 (×3): qty 2
  Filled 2017-11-28: qty 4
  Filled 2017-11-28: qty 2
  Filled 2017-11-28: qty 4
  Filled 2017-11-28: qty 2
  Filled 2017-11-28: qty 4
  Filled 2017-11-28: qty 2
  Filled 2017-11-28: qty 4
  Filled 2017-11-28: qty 2
  Filled 2017-11-28 (×2): qty 4

## 2017-11-28 MED ORDER — PANTOPRAZOLE SODIUM 40 MG PO TBEC: 40.0000 mg | DELAYED_RELEASE_TABLET | Freq: Every day | ORAL | Status: DC | PRN

## 2017-11-28 MED ORDER — SODIUM CHLORIDE 0.9 % IV BOLUS
1500.0000 mL | Freq: Once | INTRAVENOUS | Status: AC
  Administered 2017-11-28: 1500 mL via INTRAVENOUS

## 2017-11-28 MED ORDER — TRAMADOL HCL 50 MG PO TABS
50.0000 mg | ORAL_TABLET | Freq: Two times a day (BID) | ORAL | Status: DC | PRN
  Administered 2017-12-01 – 2017-12-03 (×2): 50 mg via ORAL
  Filled 2017-11-28 (×2): qty 1

## 2017-11-28 MED ORDER — ACETAMINOPHEN 325 MG PO TABS
650.0000 mg | ORAL_TABLET | Freq: Four times a day (QID) | ORAL | Status: DC | PRN
  Administered 2017-11-29 – 2017-12-02 (×4): 650 mg via ORAL
  Filled 2017-11-28 (×5): qty 2

## 2017-11-28 NOTE — Progress Notes (Signed)
RT set up cpap in pt rooms, pt stated he believed he could place it on himself when ready for bed. RT made pt aware if he needed any help to let RN know and they will call. Rt will continue to monitor as needed.

## 2017-11-28 NOTE — Progress Notes (Signed)
  PROGRESS NOTE  James Hardy [id number] DOB: March 02, 1929 DOA: 11/27/2017 PCP: [name], MD  Brief Narrative: 84 year old man PMH recently diagnosed AML currently enrolled in a phase 2 study of Azacitidine in combination with pembrolizumab presented with fever, chills, dysuria, urinary frequency.  Last dose of chemotherapy 3 weeks ago.  Assessment/Plan Neutropenic fever, secondary to chemotherapy.  Sepsis was considered on admission but the patient had normal lactic acid and no evidence of endorgan dysfunction.  Sepsis ruled out.  Urinalysis negative.  Chest x-ray negative. --So far no source of infection identified.  Continue empiric antibiotics for neutropenic fever.  Follow-up culture data.  AML, pancytopenia, currently undergoing clinical trial chemotherapy at [facility].  EDP discussed with oncology on-call [facility], recommendation to treat with antibiotics. --Daily CBC with differential.  No evidence of bleeding or petechiae.  Transfuse platelets less than 10 or hemoglobin less than 7.  CKD stage III --Appears stable.  BPH --Continue Proscar, Flomax  DVT prophylaxis: none (low plts) Code Status: Full Family Communication: wife at bedside Disposition Plan: home    Murray Hodgkins, MD  [facility] Direct contact: [phone] --[place]ia [place]  --[email or web address]; password [email or web address]  7PM-7AM contact night coverage as above 11/28/2017, 1:22 PM  LOS: 0 days   Consultants:    Procedures:    Antimicrobials:  Cefepime 9/5  Vancomycin 9/5  Interval history/Subjective: Feels well.  Has some cough.  Low back pain and joint pain which hasbeen present since initiation of chemotherapy.  Objective: Vitals:  Vitals:   11/28/17 0821 11/28/17 1159  BP:  (!) 157/66  Pulse:  74  Resp:  (!) 26  Temp: 98.5 F (36.9 C) 98.4 F (36.9 C)  SpO2:  99%    Exam:  Constitutional:  . Appears calm and comfortable, well-appearing Eyes:  . pupils and irises appear  normal . Normal lids  ENMT:  . grossly normal hearing  . Lips appear normal Respiratory:  . CTA bilaterally, no w/r/r.  . Respiratory effort normal.  Cardiovascular:  . RRR, no m/r/g . No LE extremity edema   Skin:  . No petechiae noted hands or feet. Psychiatric:  . Mental status o Mood, affect appropriate  I have personally reviewed the following:   Labs:  BMP unremarkable, sodium 133 and trending up.  BUN trending down, 30.  Creatinine trending down, 1.37.  Appears to be at baseline.  Lactic acid within normal limits, procalcitonin negative  ANC 200 yesterday.  WBCs 0.6 today.  Hemoglobin trending down, 7.7.  Likely from aggressive IV fluids.  Platelets 16, stable.  Imaging studies:  Chest x-ray no acute disease       Scheduled Meds: . finasteride  5 mg Oral Daily  . fluconazole  400 mg Oral Daily  . tamsulosin  0.4 mg Oral QHS  . valACYclovir  500 mg Oral Daily   Continuous Infusions: . ceFEPime (MAXIPIME) IV    . [START ON 11/29/2017] vancomycin      Principal Problem:   Neutropenia with fever (HCC) Active Problems:   Benign prostatic hyperplasia   AML (acute myelogenous leukemia) (HCC)   CKD (chronic kidney disease), stage III (HCC)   Pancytopenia (HCC)   LOS: 0 days

## 2017-11-28 NOTE — Progress Notes (Signed)
Pharmacy Antibiotic Note  James Hardy is a 84 y.o. male admitted on 11/27/2017 with AML and febrile neutropenia; noted urinary frequency and dysuria w/ fever that started today.  Pharmacy has been consulted for vancomycin and cefepime dosing.  Plan: Vancomycin 1250mg  x1 then 750mg  IV every 24 hours.  Goal trough 15-20 mcg/mL.  Cefepime 2g IV every 24 hours.  Height: 5\' 3"  (160 cm) Weight: 136 lb 7.4 oz (61.9 kg) IBW/kg (Calculated) : 56.9  Temp (24hrs), Avg:102.2 F (39 C), Min:102.2 F (39 C), Max:102.2 F (39 C)  Recent Labs  Lab 11/27/17 2039 11/27/17 2103  WBC 0.7*  --   CREATININE 1.50*  --   LATICACIDVEN  --  1.24    Estimated Creatinine Clearance: 27.9 mL/min (A) (by C-G formula based on SCr of 1.5 mg/dL (H)).    Allergies  Allergen Reactions  . Neosporin [Neomycin-Bacitracin Zn-Polymyx] Rash     Thank you for allowing pharmacy to be a part of this patient's care.  Wynona Neat, PharmD, BCPS  11/28/2017 12:19 AM

## 2017-11-28 NOTE — ED Notes (Signed)
Foley is not indicated for purpose of difficulty urinating. Pt able to urinate in urinal.

## 2017-11-29 ENCOUNTER — Other Ambulatory Visit: Payer: Self-pay

## 2017-11-29 LAB — CBC WITH DIFFERENTIAL/PLATELET
BASOS ABS: 0 10*3/uL (ref 0.0–0.1)
BASOS PCT: 2 %
EOS ABS: 0 10*3/uL (ref 0.0–0.7)
Eosinophils Relative: 0 %
HCT: 22.7 % — ABNORMAL LOW (ref 39.0–52.0)
HEMOGLOBIN: 7.6 g/dL — AB (ref 13.0–17.0)
LYMPHS PCT: 68 %
Lymphs Abs: 0.5 10*3/uL — ABNORMAL LOW (ref 0.7–4.0)
MCH: 27.9 pg (ref 26.0–34.0)
MCHC: 33.5 g/dL (ref 30.0–36.0)
MCV: 83.5 fL (ref 78.0–100.0)
Monocytes Absolute: 0 10*3/uL — ABNORMAL LOW (ref 0.1–1.0)
Monocytes Relative: 6 %
NEUTROS PCT: 24 %
Neutro Abs: 0.2 10*3/uL — ABNORMAL LOW (ref 1.7–7.7)
Platelets: 13 10*3/uL — CL (ref 150–400)
RBC: 2.72 MIL/uL — ABNORMAL LOW (ref 4.22–5.81)
RDW: 15.3 % (ref 11.5–15.5)
WBC: 0.7 10*3/uL — CL (ref 4.0–10.5)

## 2017-11-29 LAB — URINE CULTURE: Culture: NO GROWTH

## 2017-11-29 NOTE — Progress Notes (Signed)
  PROGRESS NOTE  James Hardy:[id number] DOB: [birth year] DOA: 11/27/2017 PCP: [name], MD  Brief Narrative: 84 year old man PMH recently diagnosed AML currently enrolled in a phase 2 study of Azacitidine in combination with pembrolizumab presented with fever, chills, dysuria, urinary frequency.  Last dose of chemotherapy 3 weeks ago.  Assessment/Plan Neutropenic fever, secondary to chemotherapy.  Sepsis was considered on admission but the patient had normal lactic acid and no evidence of endorgan dysfunction.  Sepsis ruled out.  Urinalysis negative.  Chest x-ray negative. --Urine culture negative.  Blood cultures pending, no growth to date.  Respiratory panel negative. --Low-grade temperature only.  Hemodynamics stable.  Continue empiric antibiotics.  AML, pancytopenia, currently undergoing clinical trial chemotherapy at [facility].  EDP discussed with oncology on-call [facility], recommendation to treat with antibiotics. --Stable.  Continue daily CBC with differential.  No change in [place].  Reviewed last oncology note from [facility], transfusion hemoglobin threshold is less than 7, irradiated blood would be needed, transfuse platelets less than 10 or for bleeding. --discussed with oncology fellow on call at [facility] Recs against Neupogen.  CKD stage III --Stable.  BPH --Continue.  Proscar, Flomax  DVT prophylaxis: none (low plts) Code Status: Full Family Communication: wife and multiple family members at bedside. Disposition Plan: home    Murray Hodgkins, MD  [facility] Direct contact: [phone] --Via amion app [place]  --[email or web address]; password [email or web address]  7PM-7AM contact night coverage as above 11/29/2017, 2:02 PM  LOS: 1 day   Consultants:    Procedures:    Antimicrobials:  Cefepime 9/5  Vancomycin 9/5  Interval history/Subjective: No new issues.  Feels well.  Objective: Vitals:  Vitals:   11/29/17 0145 11/29/17 0507  BP:  (!) 143/59  Pulse:  73  Resp:    Temp: 99.9 F  (37.7 C) 98.3 F (36.8 C)  SpO2:  97%    Exam: Constitutional:   . Appears calm and comfortable Respiratory:  . CTA bilaterally, no w/r/r.  . Respiratory effort normal. Cardiovascular:  . RRR, no m/r/g . No LE extremity edema   Musculoskeletal:  . Digits/nails BUE/BLE: no clubbing, cyanosis, petechiae, infection Skin:  . No rashes, no petechiae Psychiatric:  . Mental status o Mood, affect appropriate I have personally reviewed the following:   Labs:  WBC 8.7, ANC 200, hemoglobin stable 7.6, platelet count 13.  Imaging studies:  Chest x-ray no acute disease       Scheduled Meds: . finasteride  5 mg Oral Daily  . fluconazole  400 mg Oral Daily  . tamsulosin  0.4 mg Oral QHS  . valACYclovir  500 mg Oral Daily   Continuous Infusions: . ceFEPime (MAXIPIME) IV Stopped (11/29/17 0700)  . vancomycin Stopped (11/29/17 0700)    Principal Problem:   Neutropenia with fever (HCC) Active Problems:   Benign prostatic hyperplasia   AML (acute myelogenous leukemia) ([place])   CKD (chronic kidney disease), stage III (HCC)   Pancytopenia (HCC)   LOS: 1 day   Time spent 40 minutes, greater than 50% counseling patient and family members and coordination of care with [facility]

## 2017-11-29 NOTE — Progress Notes (Signed)
Pt declined use of CPAP for the night- unable to tolerate hospital equipment last PM.  Machine at bedside and pt will call if he changes his mind.

## 2017-11-30 LAB — CBC WITH DIFFERENTIAL/PLATELET
BASOS PCT: 3 %
Basophils Absolute: 0 10*3/uL (ref 0.0–0.1)
EOS PCT: 0 %
Eosinophils Absolute: 0 10*3/uL (ref 0.0–0.7)
HCT: 21.8 % — ABNORMAL LOW (ref 39.0–52.0)
Hemoglobin: 7.3 g/dL — ABNORMAL LOW (ref 13.0–17.0)
Lymphocytes Relative: 66 %
Lymphs Abs: 0.5 10*3/uL — ABNORMAL LOW (ref 0.7–4.0)
MCH: 28.1 pg (ref 26.0–34.0)
MCHC: 33.5 g/dL (ref 30.0–36.0)
MCV: 83.8 fL (ref 78.0–100.0)
Monocytes Absolute: 0 10*3/uL — ABNORMAL LOW (ref 0.1–1.0)
Monocytes Relative: 8 %
NEUTROS ABS: 0.1 10*3/uL — AB (ref 1.7–7.7)
Neutrophils Relative %: 23 %
Platelets: 11 10*3/uL — CL (ref 150–400)
RBC: 2.6 MIL/uL — ABNORMAL LOW (ref 4.22–5.81)
RDW: 15.1 % (ref 11.5–15.5)
WBC: 0.6 10*3/uL — CL (ref 4.0–10.5)

## 2017-11-30 NOTE — Progress Notes (Signed)
  PROGRESS NOTE  James Hardy [id number] DOB: [birth year] DOA: 11/27/2017 PCP: [name], MD  Brief Narrative: 84 year old man PMH recently diagnosed AML currently enrolled in a phase 2 study of Azacitidine in combination with pembrolizumab presented with fever, chills, dysuria, urinary frequency.  Last dose of chemotherapy 3 weeks ago.  Assessment/Plan Neutropenic fever, secondary to chemotherapy.  Sepsis was considered on admission but the patient had normal lactic acid and no evidence of endorgan dysfunction.  Sepsis ruled out.  Urinalysis negative.  Chest x-ray negative. Urine culture negative. Respiratory panel negative. --Blood cultures no growth today.  Febrile again last night.  Continue empiric antibiotics.  [facility] oncologist recommended against Neupogen.    AML, pancytopenia, currently undergoing clinical trial chemotherapy at [facility].  EDP discussed with oncology on-call [facility], recommendation to treat with antibiotics. --Hemoglobin stable, platelets slightly lower today.  Continue daily CBC with differential. Last oncology note from [facility] noted transfusion hemoglobin threshold is less than 7, irradiated blood would be needed, transfuse platelets less than 10 or for bleeding.  CKD stage III --Stable.  BPH --Continue.  Proscar, Flomax  DVT prophylaxis: none (low plts) Code Status: Full Family Communication: Wife at bedside Disposition Plan: home    Murray Hodgkins, MD  [facility] Direct contact: [phone] --[address] [place]  --[email or web address]; password [email or web address]  7PM-7AM contact night coverage as above 11/30/2017, 2:43 PM  LOS: 2 days   Consultants:    Procedures:    Antimicrobials:  Cefepime 9/5  Vancomycin 9/5  Interval history/Subjective: Feels better today. Dry cough. Breathing fine. No new bruising.   Objective: Vitals:  Vitals:   11/30/17 0600 11/30/17 1325  BP:  (!) 147/62  Pulse:  69  Resp:  18  Temp: 98.7 F (37.1 C) 98.4 F (36.9 C)    SpO2:  99%    Exam: Constitutional:   . Appears calm and comfortable Respiratory:  . CTA bilaterally, no w/r/r.  . Respiratory effort normal.  Cardiovascular:  . RRR, 2/6 holosystolic murmur . No LE extremity edema   Skin:  . No new bruising or petechiae hands or feet. . palpation of skin: no induration or nodules Psychiatric:  . Mental status o Mood, affect appropriate  I have personally reviewed the following:   Labs:  WBC 8.6, ANC 100, hemoglobin 7.3, platelets 11.        Scheduled Meds: . finasteride  5 mg Oral Daily  . fluconazole  400 mg Oral Daily  . tamsulosin  0.4 mg Oral QHS  . valACYclovir  500 mg Oral Daily   Continuous Infusions: . ceFEPime (MAXIPIME) IV Stopped (11/29/17 2357)  . vancomycin Stopped (11/30/17 0111)    Principal Problem:   Neutropenia with fever (HCC) Active Problems:   Benign prostatic hyperplasia   AML (acute myelogenous leukemia) (HCC)   CKD (chronic kidney disease), stage III (HCC)   Pancytopenia (HCC)   LOS: 2 days

## 2017-12-01 LAB — CBC WITH DIFFERENTIAL/PLATELET
BASOS PCT: 7 %
Basophils Absolute: 0 10*3/uL (ref 0.0–0.1)
EOS PCT: 0 %
Eosinophils Absolute: 0 10*3/uL (ref 0.0–0.7)
HCT: 22.8 % — ABNORMAL LOW (ref 39.0–52.0)
Hemoglobin: 7.5 g/dL — ABNORMAL LOW (ref 13.0–17.0)
LYMPHS ABS: 0.3 10*3/uL — AB (ref 0.7–4.0)
Lymphocytes Relative: 65 %
MCH: 27.8 pg (ref 26.0–34.0)
MCHC: 32.9 g/dL (ref 30.0–36.0)
MCV: 84.4 fL (ref 78.0–100.0)
MONO ABS: 0 10*3/uL — AB (ref 0.1–1.0)
Monocytes Relative: 7 %
NEUTROS ABS: 0.1 10*3/uL — AB (ref 1.7–7.7)
Neutrophils Relative %: 21 %
Platelets: 11 10*3/uL — CL (ref 150–400)
RBC: 2.7 MIL/uL — ABNORMAL LOW (ref 4.22–5.81)
RDW: 15.4 % (ref 11.5–15.5)
WBC: 0.4 10*3/uL — CL (ref 4.0–10.5)

## 2017-12-01 LAB — BASIC METABOLIC PANEL
Anion gap: 6 (ref 5–15)
BUN: 21 mg/dL (ref 8–23)
CALCIUM: 7.4 mg/dL — AB (ref 8.9–10.3)
CO2: 20 mmol/L — ABNORMAL LOW (ref 22–32)
Chloride: 107 mmol/L (ref 98–111)
Creatinine, Ser: 1.22 mg/dL (ref 0.61–1.24)
GFR calc Af Amer: 60 mL/min — ABNORMAL LOW (ref >60)
GFR, EST NON AFRICAN AMERICAN: 51 mL/min — AB (ref >60)
GLUCOSE: 135 mg/dL — AB (ref 70–99)
POTASSIUM: 3.7 mmol/L (ref 3.5–5.1)
Sodium: 133 mmol/L — ABNORMAL LOW (ref 135–145)

## 2017-12-01 LAB — VANCOMYCIN, TROUGH: Vancomycin Tr: 6 ug/mL — ABNORMAL LOW (ref 15–20)

## 2017-12-01 MED ORDER — VANCOMYCIN HCL IN DEXTROSE 750-5 MG/150ML-% IV SOLN
750.0000 mg | Freq: Two times a day (BID) | INTRAVENOUS | Status: DC
  Administered 2017-12-01 – 2017-12-03 (×5): 750 mg via INTRAVENOUS
  Filled 2017-12-01 (×5): qty 150

## 2017-12-01 MED ORDER — ALUM & MAG HYDROXIDE-SIMETH 200-200-20 MG/5ML PO SUSP
30.0000 mL | Freq: Four times a day (QID) | ORAL | Status: DC | PRN
  Administered 2017-12-02: 30 mL via ORAL
  Filled 2017-12-01 (×2): qty 30

## 2017-12-01 NOTE — Progress Notes (Signed)
RN placed pt on CPAP with previous settings. Pt tolerating well. Rt to cont to monitor.

## 2017-12-01 NOTE — Discharge Summary (Addendum)
Physician Discharge Summary  James Hardy [id number] DOB: 1928-06-14 DOA: 11/27/2017  PCP: [name], MD  Admit date: 11/27/2017 Discharge date: 12/02/2017  TRANSFER TO [facility] ACCEPTING MD DR A [place] persistent febrile neutropenia inpatient currently on chemotherapy study under the direction of [name]  Addendum 9/10.  Remains stable for transfer to [facility].  Discharge Diagnoses:  1. Neutropenic fever, secondary to chemotherapy 2. AML, pancytopenia 3. CKD stage III 4. BPH  Discharge Condition: stable Disposition: transfer to [facility]  Diet recommendation: regular  Filed Weights   11/28/17 0000 11/28/17 0103  Weight: 61.9 kg 63 kg    History of present illness:  84 year old man PMH recently diagnosed AML currently enrolled in a phase 2 study ofAzacitidinein combination with pembrolizumab presented with fever, chills, dysuria, urinary frequency.  Last dose of chemotherapy 3 weeks ago.  Hospital Course:  Initial infectious work-up was negative.  The patient was treated empirically with cefepime and vancomycin.  Despite this he continued to have intermittent fever although hemodynamics remained stable.  Culture data negative thus far, no localizing signs or symptoms.  Platelets, hemoglobin have remained stable.  WBC without evidence of recovery. I received a phone call from Dr. Luis Abed nurse at [facility] requesting transfer given the patient's failure to improve, given the fact that he is on a chemotherapy study.  I discussed the case with Dr. Luetta Nutting at [facility] at Dr. Luis Abed request and she has accepted the patient on behalf of [name].  Patient appears clinically stable for transfer.  Neutropenic fever, secondary to chemotherapy.  Sepsis was considered on admission but the patient had normal lactic acid and no evidence of endorgan dysfunction.  Sepsis ruled out.  Urinalysis negative.  Chest x-ray negative. Urine culture negative. Respiratory panel  negative. --Blood cultures remain no growth today.  Febrile again last night.  We will continue empiric antibiotics.   AML, pancytopenia, currently undergoing clinical trial chemotherapy at [facility].  EDP discussed with oncology on-call [facility], recommendation to treat with antibiotics. --Hemoglobin, platelet count remains stable.  WBC lower.  Culture data negative.    CKD stage III --Remains stable.  BPH --Continue.  Proscar, Flomax  Antimicrobials:  Cefepime 9/5 >  Vancomycin 9/5 >  Today's assessment: Had a bad night last night, was febrile, had some back pain which she associates with the administration of Tylenol, 2 nights in a row.  Currently feels well, no cough.  Breathing fine.  No back pain now.  Objective: Vitals:      Vitals:   12/01/17 1352 12/01/17 1435  BP: (!) 149/58   Pulse: 80   Resp: 18   Temp: 98.4 F (36.9 C) 99.4 F (37.4 C)  SpO2: 100%     Exam: Constitutional:    Appears calm and comfortable Eyes:   pupils and irises appear normal ENMT:   grossly normal hearing  Respiratory:   CTA bilaterally, no w/r/r.   Respiratory effort normal.  Cardiovascular:   RRR, no m/r/g Skin:   No rashes, lesions, ulcers. Back appears unremarkable.  No pain with palpation. Psychiatric:   Mental status ? Mood, affect appropriate  Labs:  ANC 100, hemoglobin 7.5, WBC 0.4, platelets 11.  BMP unremarkable.  Discharge Instructions   Allergies as of 12/02/2017      Reactions   Neosporin [neomycin-bacitracin Zn-polymyx] Rash      Medication List    TAKE these medications   doxazosin 8 MG tablet Commonly known as:  CARDURA Take 1 tablet (8  mg total) by mouth at bedtime.   fluconazole 200 MG tablet Commonly known as:  DIFLUCAN Take 400 mg by mouth daily.   levofloxacin 500 MG tablet Commonly known as:  LEVAQUIN Take 500 mg by mouth daily.   NON FORMULARY CPAP machine at bedtime   pantoprazole 40 MG tablet Commonly known as:   PROTONIX TAKE 1 TABLET BY MOUTH EVERY DAY   tamsulosin 0.4 MG Caps capsule Commonly known as:  FLOMAX Take 0.4 mg by mouth at bedtime.   traMADol 50 MG tablet Commonly known as:  ULTRAM Take 50 mg by mouth every 12 (twelve) hours as needed for moderate pain.   valACYclovir 500 MG tablet Commonly known as:  VALTREX Take 500 mg by mouth daily.      Allergies  Allergen Reactions  . Neosporin [Neomycin-Bacitracin Zn-Polymyx] Rash    The results of significant diagnostics from this hospitalization (including imaging, microbiology, ancillary and laboratory) are listed below for reference.    Significant Diagnostic Studies: Dg Chest 2 View  Result Date: 11/27/2017 CLINICAL DATA:  Chest pain and shortness of breath. EXAM: CHEST - 2 VIEW COMPARISON:  Right ribs 08/22/2016, chest 07/19/2016 FINDINGS: Borderline heart size with normal pulmonary vascularity. Cardiac valve prosthesis. Slight fibrosis in the lung bases. No airspace disease or consolidation. Blunting of the left costophrenic angle may represent pleural thickening or a minimal pleural effusion. No pneumothorax. Mediastinal contours appear intact. Calcification of the aorta. Degenerative changes in the spine and shoulders. IMPRESSION: Slight fibrosis in the lung bases. Fluid or thickened pleura blunting the left costophrenic angle. No evidence of active pulmonary disease. Electronically Signed   By: Lucienne Capers M.D.   On: 11/27/2017 21:06    Microbiology: Recent Results (from the past 240 hour(s))  Culture, blood (Routine x 2)     Status: None   Collection Time: 11/27/17  8:00 PM  Result Value Ref Range Status   Specimen Description BLOOD RIGHT ARM  Final   Special Requests   Final    BOTTLES DRAWN AEROBIC AND ANAEROBIC Blood Culture results may not be optimal due to an excessive volume of blood received in culture bottles   Culture   Final    NO GROWTH 5 DAYS Performed at [facility], [place] [address],  [place], [place] [address]    Report Status 12/02/2017 FINAL  Final  Culture, blood (Routine x 2)     Status: None   Collection Time: 11/27/17  8:36 PM  Result Value Ref Range Status   Specimen Description BLOOD RIGHT HAND  Final   Special Requests   Final    BOTTLES DRAWN AEROBIC ONLY Blood Culture results may not be optimal due to an excessive volume of blood received in culture bottles   Culture   Final    NO GROWTH 5 DAYS Performed at [facility], [place] [address] [place], [place] [address]    Report Status 12/02/2017 FINAL  Final  Urine culture     Status: None   Collection Time: 11/27/17  8:47 PM  Result Value Ref Range Status   Specimen Description URINE, RANDOM  Final   Special Requests NONE  Final   Culture   Final    NO GROWTH Performed at [facility], [place] [address], [place], [place] [address]    Report Status 11/29/2017 FINAL  Final  Respiratory Panel by PCR     Status: None   Collection Time: 11/28/17  2:59 AM  Result Value  Ref Range Status   Adenovirus NOT DETECTED NOT DETECTED Final   Coronavirus 229E NOT DETECTED NOT DETECTED Final   Coronavirus HKU1 NOT DETECTED NOT DETECTED Final   Coronavirus NL63 NOT DETECTED NOT DETECTED Final   Coronavirus OC43 NOT DETECTED NOT DETECTED Final   Metapneumovirus NOT DETECTED NOT DETECTED Final   Rhinovirus / Enterovirus NOT DETECTED NOT DETECTED Final   Influenza A NOT DETECTED NOT DETECTED Final   Influenza B NOT DETECTED NOT DETECTED Final   Parainfluenza Virus 1 NOT DETECTED NOT DETECTED Final   Parainfluenza Virus 2 NOT DETECTED NOT DETECTED Final   Parainfluenza Virus 3 NOT DETECTED NOT DETECTED Final   Parainfluenza Virus 4 NOT DETECTED NOT DETECTED Final   Respiratory Syncytial Virus NOT DETECTED NOT DETECTED Final   Bordetella pertussis NOT DETECTED NOT DETECTED Final   Chlamydophila pneumoniae NOT DETECTED NOT DETECTED Final   Mycoplasma pneumoniae NOT DETECTED NOT DETECTED Final    Comment: Performed at  [facility], [place] [address], [place], [place] [address]     Labs: Basic Metabolic Panel: Recent Labs  Lab 11/27/17 2039 11/28/17 0224 12/01/17 0942  NA 131* 133* 133*  K 4.4 4.0 3.7  CL 103 105 107  CO2 21* 21* 20*  GLUCOSE 105* 101* 135*  BUN 35* 30* 21  CREATININE 1.50* 1.37* 1.22  CALCIUM 8.1* 7.4* 7.4*   Liver Function Tests: Recent Labs  Lab 11/27/17 2039  AST 22  ALT 18  ALKPHOS 39  BILITOT 1.2  PROT 5.9*  ALBUMIN 3.1*   CBC: Recent Labs  Lab 11/27/17 2039 11/28/17 0224 11/29/17 0346 11/30/17 0245 12/01/17 0942 12/02/17 0345  WBC 0.7* 0.6* 0.7* 0.6* 0.4* 0.5*  NEUTROABS 0.2*  --  0.2* 0.1* 0.1* 0.1*  HGB 8.9* 7.7* 7.6* 7.3* 7.5* 6.9*  HCT 27.1* 23.2* 22.7* 21.8* 22.8* 20.8*  MCV 84.7 83.5 83.5 83.8 84.4 83.9  PLT 19* 16* 13* 11* 11* 11*    Principal Problem:   Neutropenia with fever (HCC) Active Problems:   Benign prostatic hyperplasia   AML (acute myelogenous leukemia) (HCC)   CKD (chronic kidney disease), stage III (HCC)   Pancytopenia (HCC)   Time coordinating discharge: 45 minutes  Signed:  Murray Hodgkins, MD [facility] 12/02/2017, 2:29 PM

## 2017-12-01 NOTE — Progress Notes (Signed)
Pharmacy Antibiotic Note  James Hardy is a 84 y.o. male admitted on 11/27/2017 with febrile neutopenia.  Pharmacy has been consulted for vancomycin and cefepime dosing.  Vanc trough well below goal.  Plan: Change vancomycin to 750mg  IV every 12 hours.  Goal trough 15-20 mcg/mL. Continue cefepime 2g Q24 hours.  Height: 5\' 2"  (157.5 cm) Weight: 138 lb 14.2 oz (63 kg) IBW/kg (Calculated) : 54.6  Temp (24hrs), Avg:100 F (37.8 C), Min:98.4 F (36.9 C), Max:102.6 F (39.2 C)  Recent Labs  Lab 11/27/17 2039 11/27/17 2103 11/28/17 0224 11/29/17 0346 11/30/17 0245 12/01/17 0047  WBC 0.7*  --  0.6* 0.7* 0.6*  --   CREATININE 1.50*  --  1.37*  --   --   --   LATICACIDVEN  --  1.24 1.1  --   --   --   VANCOTROUGH  --   --   --   --   --  6*    Estimated Creatinine Clearance: 29.3 mL/min (A) (by C-G formula based on SCr of 1.37 mg/dL (H)).    Allergies  Allergen Reactions  . Neosporin [Neomycin-Bacitracin Zn-Polymyx] Rash    Antimicrobials this admission: Vanc 9/5 >>  Cefepime 9/5 >>   Dose adjustments this admission: Vanc 750 Q24 > trough 6 > vanc 750 Q12  Microbiology results: 9/5 BCx: neg 9/5 UCx: neg   Thank you for allowing pharmacy to be a part of this patient's care.  Wynona Neat, PharmD, BCPS  12/01/2017 2:20 AM

## 2017-12-01 NOTE — Progress Notes (Signed)
  PROGRESS NOTE  James Hardy [id number]:[id number] DOB: [birth year] DOA: 11/27/2017 PCP: [name], MD  Brief Narrative: 84 year old man PMH recently diagnosed AML currently enrolled in a phase 2 study of Azacitidine in combination with pembrolizumab presented with fever, chills, dysuria, urinary frequency.  Last dose of chemotherapy 3 weeks ago.  Assessment/Plan Neutropenic fever, secondary to chemotherapy.  Sepsis was considered on admission but the patient had normal lactic acid and no evidence of endorgan dysfunction.  Sepsis ruled out.  Urinalysis negative.  Chest x-ray negative. Urine culture negative. Respiratory panel negative. --Blood cultures remain no growth today.  Febrile again last night.  We will continue empiric antibiotics.   AML, pancytopenia, currently undergoing clinical trial chemotherapy at [facility].  EDP discussed with oncology on-call [facility], recommendation to treat with antibiotics. --Hemoglobin, platelet count remains stable.  WBC lower.  Culture data negative.    CKD stage III --Remains stable.  BPH --Continue.  Proscar, Flomax  I received a phone call from Dr. Luis Abed nurse at [facility] requesting transfer given the patient's failure to improve, given the fact that he is on a chemotherapy study.  I discussed the case with Dr. Luetta Nutting at [facility] at Dr. Luis Abed request and she has accepted the patient on behalf of [name].  Patient appears clinically stable for transfer.  DVT prophylaxis: none (low plts) Code Status: Full Family Communication: Wife at bedside. Disposition Plan: home    Murray Hodgkins, MD  [facility] Direct contact: [phone] --Via amion app OR  --[email or web address]; password [email or web address]  7PM-7AM contact night coverage as above 12/01/2017, 2:55 PM  LOS: 3 days   Consultants:    Procedures:    Antimicrobials:  Cefepime 9/5 >  Vancomycin 9/5 >  Interval history/Subjective: Had a bad night last night, was febrile, had some back pain which  she associates with the administration of Tylenol, 2 nights in a row.  Currently feels well, no cough.  Breathing fine.  No back pain now.  Objective: Vitals:  Vitals:   12/01/17 1352 12/01/17 1435  BP: (!) 149/58   Pulse: 80   Resp: 18   Temp: 98.4 F (36.9 C) 99.4 F (37.4 C)  SpO2: 100%     Exam: Constitutional:   . Appears calm and comfortable Eyes:  . pupils and irises appear normal ENMT:  . grossly normal hearing  Respiratory:  . CTA bilaterally, no w/r/r.  . Respiratory effort normal.  Cardiovascular:  . RRR, no m/r/g Skin:  . No rashes, lesions, ulcers. Back appears unremarkable.  No pain with palpation. Psychiatric:  . Mental status o Mood, affect appropriate  I have personally reviewed the following:   Labs:  ANC 100, hemoglobin 7.5, WBC 0.4, platelets 11.  BMP unremarkable.        Scheduled Meds: . finasteride  5 mg Oral Daily  . fluconazole  400 mg Oral Daily  . tamsulosin  0.4 mg Oral QHS  . valACYclovir  500 mg Oral Daily   Continuous Infusions: . ceFEPime (MAXIPIME) IV 2 g (11/30/17 2137)  . vancomycin 750 mg (12/01/17 0943)    Principal Problem:   Neutropenia with fever (HCC) Active Problems:   Benign prostatic hyperplasia   AML (acute myelogenous leukemia) (HCC)   CKD (chronic kidney disease), stage III (HCC)   Pancytopenia (HCC)   LOS: 3 days

## 2017-12-01 NOTE — Progress Notes (Signed)
Spoke with patient and wife at bedside concerning CPAP.  Patient states he couldn't wear ours and will administer his own.

## 2017-12-02 LAB — CBC WITH DIFFERENTIAL/PLATELET
BASOS PCT: 4 %
Basophils Absolute: 0 10*3/uL (ref 0.0–0.1)
EOS ABS: 0 10*3/uL (ref 0.0–0.7)
EOS PCT: 0 %
HCT: 20.8 % — ABNORMAL LOW (ref 39.0–52.0)
HEMOGLOBIN: 6.9 g/dL — AB (ref 13.0–17.0)
LYMPHS PCT: 61 %
Lymphs Abs: 0.4 10*3/uL — ABNORMAL LOW (ref 0.7–4.0)
MCH: 27.8 pg (ref 26.0–34.0)
MCHC: 33.2 g/dL (ref 30.0–36.0)
MCV: 83.9 fL (ref 78.0–100.0)
Monocytes Absolute: 0 10*3/uL — ABNORMAL LOW (ref 0.1–1.0)
Monocytes Relative: 8 %
NEUTROS PCT: 27 %
Neutro Abs: 0.1 10*3/uL — ABNORMAL LOW (ref 1.7–7.7)
Platelets: 11 10*3/uL — CL (ref 150–400)
RBC: 2.48 MIL/uL — AB (ref 4.22–5.81)
RDW: 15.2 % (ref 11.5–15.5)
WBC: 0.5 10*3/uL — AB (ref 4.0–10.5)

## 2017-12-02 LAB — CULTURE, BLOOD (ROUTINE X 2)
Culture: NO GROWTH
Culture: NO GROWTH

## 2017-12-02 MED ORDER — ACETAMINOPHEN 325 MG PO TABS
650.0000 mg | ORAL_TABLET | Freq: Once | ORAL | Status: AC
  Administered 2017-12-02: 650 mg via ORAL
  Filled 2017-12-02: qty 2

## 2017-12-02 MED ORDER — SODIUM CHLORIDE 0.9% IV SOLUTION: Freq: Once | INTRAVENOUS | Status: DC

## 2017-12-02 MED ORDER — DIPHENHYDRAMINE HCL 25 MG PO CAPS
25.0000 mg | ORAL_CAPSULE | Freq: Once | ORAL | Status: AC
  Administered 2017-12-02: 25 mg via ORAL
  Filled 2017-12-02: qty 1

## 2017-12-02 NOTE — Consult Note (Signed)
[name] [name]  12/02/2017  James Hardy December 17, 1928 [id number]   Met with patient, wife [name]) and son ([name])in the room toidentify possible discharge needs.  Patient reports that he was having "elevated temperatures" which had led to this admission.(neutropenic fever secondary to chemotherapy, AML- acute myelogenous leukemia and pancytopenia)  Patientstates that hisprimary care provideris [name] with [facility] [place].    [facility] [facility].   Patientreportsmanaginghis medications at [name] assistance.  Patient states thathe has been driving prior to admission but wife and son will be providing transportation to hisdoctors' appointments after discharge.  Patient's wife and son (retired) will serve as his primary caregivers when discharge back home.  Anticipated discharge plan per MD note, [place]  (accepting MD is [name]). REASON FOR TRANSFER: persistent febrile neutropenia inpatient currently on chemotherapy study under the direction of [name].  Patientand family wereencouragedand had expressed understandingto follow-up withhisprimary care providerafter hospital discharge or whenhereturns back home in order to help managehishealth issues/ needs post hospitalization.  Patientand wife voicedunderstandingto seekreferralfrom primary care provider to [facility] care management ifdeemed necessary and appropriatefor services in the future- oncepatientreturnsbackhome.  [facility] care management information was provided for future needsthatpatient may have.  Primary care provider's office is listed as providing transition of care (TOC).   For additional questions please contact:  [name], BSN, RN-BC [facility] PRIMARY CARE Navigator Cell: [phone]

## 2017-12-02 NOTE — Progress Notes (Signed)
PROGRESS NOTE  James Hardy [id number]:[id number] DOB: 1928/08/10 DOA: 11/27/2017 PCP: [name], MD  Brief Narrative: 84 year old man PMH recently diagnosed AML currently enrolled in a phase 2 study of Azacitidine in combination with pembrolizumab presented with fever, chills, dysuria, urinary frequency.  Last dose of chemotherapy 3 weeks ago.  Admitted for neutropenic fever, treated with empiric cefepime and vancomycin.  No evidence of infection.  Continues to have intermittent fever.  No significant improvement in WBC.  [facility] has requested transfer to their facility, awaiting bed assignment.  In meantime continue antibiotics, transfuse blood and follow blood work daily.  Assessment/Plan Neutropenic fever, secondary to chemotherapy.  Sepsis was considered on admission but the patient had normal lactic acid and no evidence of endorgan dysfunction.  Sepsis ruled out.  Urinalysis negative.  Chest x-ray negative. Urine culture negative. Respiratory panel negative. --Blood cultures no growth, final.  Remains neutropenic.  Continue empiric antibiotics.  Will recheck blood cultures if has temperature 101 or greater.  AML, pancytopenia, currently undergoing clinical trial chemotherapy at [facility].  EDP discussed with oncology on-call [facility], recommendation to treat with antibiotics. --Hemoglobin slightly lower, transfuse PRBC, irradiated.  Platelet count WBC stable.  No evidence of bleeding or petechiae. --CBC in a.m.  CKD stage III --Stable.  BPH --Continue.  Proscar, Flomax  Remains stable for transfer to [facility]  DVT prophylaxis: none (low plts) Code Status: Full Family Communication: Wife at bedside 9/10. Disposition Plan: home    Murray Hodgkins, MD  [facility] Direct contact: [phone] --Via amion app OR  --[email or web address]; password [email or web address]  7PM-7AM contact night coverage as above 12/02/2017, 2:30 PM  LOS: 4 days   Consultants:    Procedures:    Antimicrobials:  Cefepime 9/5  >  Vancomycin 9/5 >  Interval history/Subjective: T-max 100.7 last 24 hours.  No back pain last night.  Some chest wall pain exacerbated by deep breathing, localized to the left.  Some pain between shoulder blades.  Objective: Vitals:  Vitals:   12/02/17 1150 12/02/17 1316  BP: (!) 141/56 121/85  Pulse: 70 72  Resp: 16 (!) 22  Temp: 99.1 F (37.3 C) 99 F (37.2 C)  SpO2: 99% 98%    Exam: Constitutional:   . Appears calm and comfortable Respiratory:  . CTA bilaterally, no w/r/r.  . Respiratory effort normal.  Cardiovascular:  . RRR, no m/r/g . No LE extremity edema   Abdomen:  . Abdomen appears normal; no tenderness or masses . No hernias . No HSM Musculoskeletal:  . Digits/nails BUE/BLE: no clubbing, cyanosis, petechiae, infection . Mild tenderness to palpation over the left chest wall. Skin:  . No petechiae noted digits of feet and hands.  No rash over chest wall lower back. Psychiatric:  . Mental status o Mood, affect appropriate  I have personally reviewed the following:   Labs:  [place] 100.  WBC 0.5, hemoglobin 6.9, platelets 11.        Scheduled Meds: . sodium chloride   Intravenous Once  . finasteride  5 mg Oral Daily  . fluconazole  400 mg Oral Daily  . tamsulosin  0.4 mg Oral QHS  . valACYclovir  500 mg Oral Daily   Continuous Infusions: . ceFEPime (MAXIPIME) IV 2 g (12/01/17 2209)  . vancomycin 750 mg (12/02/17 0746)    Principal Problem:   Neutropenia with fever (HCC) Active Problems:   Benign prostatic hyperplasia   AML (acute myelogenous leukemia) (HCC)   CKD (chronic kidney disease), stage III ([place])  Pancytopenia ([place])   LOS: 4 days

## 2017-12-02 NOTE — Progress Notes (Signed)
CRITICAL VALUE ALERT  Critical Value:  Hgb 6.9  Date & Time Notied:  12/02/17 0510  Provider Notified: Alger Memos, NP   Orders Received/Actions taken: Received orders to transfuse 1 unit of blood.

## 2017-12-03 LAB — CBC WITH DIFFERENTIAL/PLATELET
BASOS ABS: 0 10*3/uL (ref 0.0–0.1)
Basophils Relative: 3 %
EOS ABS: 0 10*3/uL (ref 0.0–0.7)
Eosinophils Relative: 0 %
HEMATOCRIT: 26.2 % — AB (ref 39.0–52.0)
HEMOGLOBIN: 8.8 g/dL — AB (ref 13.0–17.0)
LYMPHS PCT: 65 %
Lymphs Abs: 0.4 10*3/uL — ABNORMAL LOW (ref 0.7–4.0)
MCH: 28.2 pg (ref 26.0–34.0)
MCHC: 33.6 g/dL (ref 30.0–36.0)
MCV: 84 fL (ref 78.0–100.0)
MONOS PCT: 9 %
Monocytes Absolute: 0.1 10*3/uL (ref 0.1–1.0)
NEUTROS PCT: 23 %
Neutro Abs: 0.2 10*3/uL — ABNORMAL LOW (ref 1.7–7.7)
Platelets: 11 10*3/uL — CL (ref 150–400)
RBC: 3.12 MIL/uL — ABNORMAL LOW (ref 4.22–5.81)
RDW: 15.2 % (ref 11.5–15.5)
WBC: 0.7 10*3/uL — CL (ref 4.0–10.5)

## 2017-12-03 LAB — BASIC METABOLIC PANEL
ANION GAP: 7 (ref 5–15)
BUN: 23 mg/dL (ref 8–23)
CHLORIDE: 108 mmol/L (ref 98–111)
CO2: 21 mmol/L — AB (ref 22–32)
Calcium: 7.7 mg/dL — ABNORMAL LOW (ref 8.9–10.3)
Creatinine, Ser: 1.29 mg/dL — ABNORMAL HIGH (ref 0.61–1.24)
GFR calc Af Amer: 56 mL/min — ABNORMAL LOW (ref >60)
GFR calc non Af Amer: 48 mL/min — ABNORMAL LOW (ref >60)
Glucose, Bld: 109 mg/dL — ABNORMAL HIGH (ref 70–99)
POTASSIUM: 4.5 mmol/L (ref 3.5–5.1)
SODIUM: 136 mmol/L (ref 135–145)

## 2017-12-03 LAB — TYPE AND SCREEN
ABO/RH(D): A POS
Antibody Screen: NEGATIVE
Unit division: 0

## 2017-12-03 LAB — BPAM RBC
Blood Product Expiration Date: 201910022359
ISSUE DATE / TIME: 201909101120
Unit Type and Rh: 6200

## 2017-12-03 LAB — VANCOMYCIN, TROUGH: VANCOMYCIN TR: 20 ug/mL (ref 15–20)

## 2017-12-03 MED ORDER — VANCOMYCIN HCL 500 MG IV SOLR: 500.0000 mg | Freq: Two times a day (BID) | INTRAVENOUS | Status: AC

## 2017-12-03 MED ORDER — ALBUTEROL SULFATE (2.5 MG/3ML) 0.083% IN NEBU: 2.5000 mg | INHALATION_SOLUTION | Freq: Four times a day (QID) | RESPIRATORY_TRACT | 12 refills | Status: AC | PRN

## 2017-12-03 MED ORDER — VANCOMYCIN HCL 500 MG IV SOLR
500.0000 mg | Freq: Two times a day (BID) | INTRAVENOUS | Status: DC
  Administered 2017-12-03: 500 mg via INTRAVENOUS
  Filled 2017-12-03 (×2): qty 500

## 2017-12-03 MED ORDER — ENSURE ENLIVE PO LIQD
237.0000 mL | Freq: Two times a day (BID) | ORAL | Status: DC
  Administered 2017-12-03: 237 mL via ORAL

## 2017-12-03 MED ORDER — IPRATROPIUM BROMIDE 0.02 % IN SOLN
0.5000 mg | Freq: Once | RESPIRATORY_TRACT | Status: AC
  Administered 2017-12-03: 0.5 mg via RESPIRATORY_TRACT
  Filled 2017-12-03: qty 2.5

## 2017-12-03 MED ORDER — ALBUTEROL SULFATE (2.5 MG/3ML) 0.083% IN NEBU
5.0000 mg | INHALATION_SOLUTION | Freq: Once | RESPIRATORY_TRACT | Status: AC
  Administered 2017-12-03: 5 mg via RESPIRATORY_TRACT
  Filled 2017-12-03: qty 6

## 2017-12-03 MED ORDER — ACETAMINOPHEN 325 MG PO TABS
650.0000 mg | ORAL_TABLET | Freq: Four times a day (QID) | ORAL | Status: DC | PRN
  Administered 2017-12-03: 650 mg via ORAL

## 2017-12-03 MED ORDER — ENSURE ENLIVE PO LIQD: 237.0000 mL | Freq: Two times a day (BID) | ORAL | 12 refills | Status: AC

## 2017-12-03 MED ORDER — ACETAMINOPHEN 650 MG RE SUPP: 650.0000 mg | Freq: Four times a day (QID) | RECTAL | Status: DC | PRN

## 2017-12-03 MED ORDER — ALBUTEROL SULFATE (2.5 MG/3ML) 0.083% IN NEBU
2.5000 mg | INHALATION_SOLUTION | Freq: Four times a day (QID) | RESPIRATORY_TRACT | Status: DC | PRN
  Administered 2017-12-03 – 2017-12-04 (×2): 2.5 mg via RESPIRATORY_TRACT
  Filled 2017-12-03 (×2): qty 3

## 2017-12-03 NOTE — Progress Notes (Signed)
Patient is complaining of shortness of breath.Wheezes bilaterally in the upper lungs. Patient is drenched in sweat. Oral temp 99. 6.  Vitals signs stable.  Notified Lamar Blinks, NP.  Will continue to monitor the patient

## 2017-12-03 NOTE — Progress Notes (Signed)
PROGRESS NOTE  James Hardy [id number] DOB: Aug 01, 1928 DOA: 11/27/2017 PCP: [name], MD  Brief Narrative: 84 year old man PMH recently diagnosed AML currently enrolled in a phase 2 study of Azacitidine in combination with pembrolizumab presented with fever, chills, dysuria, urinary frequency.  Last dose of chemotherapy 3 weeks ago.  Admitted for neutropenic fever, treated with empiric cefepime and vancomycin.  No evidence of infection.  Continues to have intermittent fever.  No significant improvement in WBC.  [facility] has requested transfer to their facility, awaiting bed assignment.  In meantime continue antibiotics, transfuse blood and follow blood work daily.  Assessment/Plan  Neutropenic fever, secondary to chemotherapy.   - Sepsis was considered on admission but the patient had normal lactic acid and no evidence of endorgan dysfunction.  Sepsis ruled out.  Urinalysis negative.  Chest x-ray negative. Urine culture negative. Respiratory panel negative. --Continue with empiric antibiotic coverage for neutropenic fever so far, culture remains with no growth, still spiking fever intermittently, tracks, Diflucan, vancomycin and cefepime. -Given patient continues to spike fever, will scan CT chest abdomen pelvis.   AML, pancytopenia,  - currently undergoing clinical trial chemotherapy at [facility].  I have called transfer center today, still no bed availability , back from his primary oncologist at [facility] regarding further management commendations, for now we will continue with monitoring CBC daily, transfuse PRBC irradiated as needed, transfuse for platelets less than 10 as long no evidence of active bleed ,  with differentials daily . Ro Discussed with DrRebould from [facility] oncology, at this point there is no further recommendation to start Granix , and be related to chemotherapy .    CKD stage III --Creatinine 1.29 today, remained stable  BPH --Continue.  Proscar, Flomax  Remains stable for  transfer to [facility]  DVT prophylaxis: none (low plts), Code Status: Full Family Communication: Discussed with wife and son at bedside Disposition Plan: home   Lewie Chamber, MD  [facility] Direct contact: [phone] --Via [place]  --[email or web address]; password [email or web address]  7PM-7AM contact night coverage as above 12/03/2017, 4:48 PM  LOS: 5 days   Consultants:    Procedures:    Antimicrobials:  Cefepime 9/5 >  Vancomycin 9/5 >  Interval history/Subjective: T-max 101.1 over last 24 hours, poor appetite .   Objective: Vitals:  Vitals:   12/03/17 0550 12/03/17 0905  BP: (!) 132/55   Pulse: 88   Resp: (!) 21   Temp: 99.1 F (37.3 C) 99.8 F (37.7 C)  SpO2: 95%     Exam:  Awake Alert, Oriented X 3, No new F.N deficits, Normal affect Symmetrical Chest wall movement, Good air movement bilaterally, CTAB RRR,No Gallops,Rubs or new Murmurs, No Parasternal Heave +ve B.Sounds, Abd Soft, No tenderness, No rebound - guarding or rigidity. No Cyanosis, Clubbing or edema, No new Rash or bruise    o   I have personally reviewed the following:   Labs:  ANC 100.  WBC 0.5, hemoglobin 6.9, platelets 11.         Scheduled Meds: . sodium chloride   Intravenous Once  . feeding supplement (ENSURE ENLIVE)  237 mL Oral BID BM  . finasteride  5 mg Oral Daily  . fluconazole  400 mg Oral Daily  . tamsulosin  0.4 mg Oral QHS  . valACYclovir  500 mg Oral Daily   Continuous Infusions: . ceFEPime (MAXIPIME) IV 2 g (12/02/17 2158)  . vancomycin      Principal Problem:   Neutropenia with fever (HCC) Active  Problems:   Benign prostatic hyperplasia   AML (acute myelogenous leukemia) (HCC)   CKD (chronic kidney disease), stage III (HCC)   Pancytopenia (HCC)   LOS: 5 days

## 2017-12-03 NOTE — Progress Notes (Addendum)
Pharmacy Antibiotic Note  James Hardy is a 84 y.o. male admitted on 11/27/2017 with Febrile Neutropenia. Pharmacy has been consulted for Vancomycin and Cefepime dosing. Vanc trough above goal with potential to accumulate.   Plan: Change vancomycin to 500mg  IV every 12 hours. Goal trough 15-20 mcg/mL.  Continue cefepime 2g every 24 hours.   Height: 5\' 2"  (157.5 cm) Weight: 138 lb 14.2 oz (63 kg) IBW/kg (Calculated) : 54.6  Temp (24hrs), Avg:99.8 F (37.7 C), Min:98.3 F (36.8 C), Max:103 F (39.4 C)  Recent Labs  Lab 11/27/17 2039 11/27/17 2103 11/28/17 0224 11/29/17 0346 11/30/17 0245 12/01/17 0047 12/01/17 0942 12/02/17 0345 12/03/17 0343 12/03/17 0630  WBC 0.7*  --  0.6* 0.7* 0.6*  --  0.4* 0.5* 0.7*  --   CREATININE 1.50*  --  1.37*  --   --   --  1.22  --  1.29*  --   LATICACIDVEN  --  1.24 1.1  --   --   --   --   --   --   --   VANCOTROUGH  --   --   --   --   --  6*  --   --   --  20    Estimated Creatinine Clearance: 31.2 mL/min (A) (by C-G formula based on SCr of 1.29 mg/dL (H)).    Allergies  Allergen Reactions  . Neosporin [Neomycin-Bacitracin Zn-Polymyx] Rash    Antimicrobials this admission: Vanc 9/5 >> Cefepime 9/5 >> Fluconazole (PTA) >> Valacyclovir (PTA) >>  Dose adjustments this admission: Vanc 750mg  Q24 > trough 6 > Vanc 750mg  Q12 > trough 20 > Vanc 500mg  Q12  Microbiology results: 9/5 BCx: neg 9/5 UCx: neg   Thank you for allowing pharmacy to be a part of this patient's care.  Latanya Maudlin, PharmD Student 12/03/2017 11:49 AM    I discussed / reviewed the pharmacy note by Dr. Larena Glassman and I agree with the resident's findings and plans as documented.  Keyshla Tunison S. Alford Highland, PharmD, [place] Clinical Staff Pharmacist Pager [phone]

## 2017-12-03 NOTE — Progress Notes (Signed)
Went to patient's room to put them on bipap, RN told RT that patient will be leaving soon.  Patient has acquired a bed at [facility].  Waiting on transport.

## 2017-12-04 DIAGNOSIS — C9200 Acute myeloblastic leukemia, not having achieved remission: Secondary | ICD-10-CM | POA: Diagnosis present

## 2017-12-04 DIAGNOSIS — R5081 Fever presenting with conditions classified elsewhere: Secondary | ICD-10-CM | POA: Diagnosis present

## 2017-12-04 DIAGNOSIS — N179 Acute kidney failure, unspecified: Secondary | ICD-10-CM | POA: Diagnosis not present

## 2017-12-04 DIAGNOSIS — J9690 Respiratory failure, unspecified, unspecified whether with hypoxia or hypercapnia: Secondary | ICD-10-CM | POA: Diagnosis not present

## 2017-12-04 DIAGNOSIS — D61818 Other pancytopenia: Secondary | ICD-10-CM | POA: Diagnosis present

## 2017-12-04 DIAGNOSIS — R918 Other nonspecific abnormal finding of lung field: Secondary | ICD-10-CM | POA: Diagnosis not present

## 2017-12-04 DIAGNOSIS — R2232 Localized swelling, mass and lump, left upper limb: Secondary | ICD-10-CM | POA: Diagnosis not present

## 2017-12-04 DIAGNOSIS — R000 Tachycardia, unspecified: Secondary | ICD-10-CM | POA: Diagnosis not present

## 2017-12-04 DIAGNOSIS — Z66 Do not resuscitate: Secondary | ICD-10-CM | POA: Diagnosis not present

## 2017-12-04 DIAGNOSIS — E873 Alkalosis: Secondary | ICD-10-CM | POA: Diagnosis not present

## 2017-12-04 DIAGNOSIS — Z9221 Personal history of antineoplastic chemotherapy: Secondary | ICD-10-CM | POA: Diagnosis not present

## 2017-12-04 DIAGNOSIS — J811 Chronic pulmonary edema: Secondary | ICD-10-CM | POA: Diagnosis not present

## 2017-12-04 DIAGNOSIS — D469 Myelodysplastic syndrome, unspecified: Secondary | ICD-10-CM | POA: Diagnosis present

## 2017-12-04 DIAGNOSIS — L570 Actinic keratosis: Secondary | ICD-10-CM | POA: Diagnosis present

## 2017-12-04 DIAGNOSIS — D709 Neutropenia, unspecified: Secondary | ICD-10-CM | POA: Diagnosis not present

## 2017-12-04 DIAGNOSIS — N183 Chronic kidney disease, stage 3 (moderate): Secondary | ICD-10-CM | POA: Diagnosis present

## 2017-12-04 DIAGNOSIS — M7989 Other specified soft tissue disorders: Secondary | ICD-10-CM | POA: Diagnosis not present

## 2017-12-04 DIAGNOSIS — G4733 Obstructive sleep apnea (adult) (pediatric): Secondary | ICD-10-CM | POA: Diagnosis present

## 2017-12-04 DIAGNOSIS — R0600 Dyspnea, unspecified: Secondary | ICD-10-CM | POA: Diagnosis not present

## 2017-12-04 DIAGNOSIS — Z952 Presence of prosthetic heart valve: Secondary | ICD-10-CM | POA: Diagnosis not present

## 2017-12-04 DIAGNOSIS — J181 Lobar pneumonia, unspecified organism: Secondary | ICD-10-CM | POA: Diagnosis present

## 2017-12-04 DIAGNOSIS — R0602 Shortness of breath: Secondary | ICD-10-CM | POA: Diagnosis not present

## 2017-12-04 DIAGNOSIS — E785 Hyperlipidemia, unspecified: Secondary | ICD-10-CM | POA: Diagnosis present

## 2017-12-04 DIAGNOSIS — M810 Age-related osteoporosis without current pathological fracture: Secondary | ICD-10-CM | POA: Diagnosis present

## 2017-12-04 DIAGNOSIS — N400 Enlarged prostate without lower urinary tract symptoms: Secondary | ICD-10-CM | POA: Diagnosis present

## 2017-12-04 DIAGNOSIS — K219 Gastro-esophageal reflux disease without esophagitis: Secondary | ICD-10-CM | POA: Diagnosis present

## 2017-12-04 DIAGNOSIS — R21 Rash and other nonspecific skin eruption: Secondary | ICD-10-CM | POA: Diagnosis not present

## 2017-12-04 DIAGNOSIS — I468 Cardiac arrest due to other underlying condition: Secondary | ICD-10-CM | POA: Diagnosis not present

## 2017-12-04 DIAGNOSIS — E872 Acidosis: Secondary | ICD-10-CM | POA: Diagnosis not present

## 2017-12-04 DIAGNOSIS — J988 Other specified respiratory disorders: Secondary | ICD-10-CM | POA: Diagnosis not present

## 2017-12-04 DIAGNOSIS — B9562 Methicillin resistant Staphylococcus aureus infection as the cause of diseases classified elsewhere: Secondary | ICD-10-CM | POA: Diagnosis not present

## 2017-12-04 DIAGNOSIS — R5084 Febrile nonhemolytic transfusion reaction: Secondary | ICD-10-CM | POA: Diagnosis not present

## 2017-12-04 DIAGNOSIS — I959 Hypotension, unspecified: Secondary | ICD-10-CM | POA: Diagnosis not present

## 2017-12-04 DIAGNOSIS — Z79891 Long term (current) use of opiate analgesic: Secondary | ICD-10-CM | POA: Diagnosis not present

## 2017-12-04 DIAGNOSIS — R0603 Acute respiratory distress: Secondary | ICD-10-CM | POA: Diagnosis not present

## 2017-12-04 DIAGNOSIS — R001 Bradycardia, unspecified: Secondary | ICD-10-CM | POA: Diagnosis not present

## 2017-12-04 DIAGNOSIS — R062 Wheezing: Secondary | ICD-10-CM | POA: Diagnosis not present

## 2017-12-04 DIAGNOSIS — J189 Pneumonia, unspecified organism: Secondary | ICD-10-CM | POA: Diagnosis not present

## 2017-12-04 DIAGNOSIS — Z9989 Dependence on other enabling machines and devices: Secondary | ICD-10-CM | POA: Diagnosis not present

## 2017-12-04 DIAGNOSIS — J90 Pleural effusion, not elsewhere classified: Secondary | ICD-10-CM | POA: Diagnosis present

## 2017-12-04 MED ORDER — ACETAMINOPHEN 325 MG PO TABS: 650.0000 mg | ORAL_TABLET | Freq: Four times a day (QID) | ORAL | Status: AC | PRN

## 2017-12-04 NOTE — Progress Notes (Signed)
Report given to the nurse at [facility] on unit 4 for oncology.

## 2017-12-04 NOTE — Progress Notes (Signed)
Report given to [facility].  Notified the patient and family of the ETA.  Will continue to monitor the patient.

## 2017-12-04 NOTE — Progress Notes (Signed)
Patient discharged from [place].  Patient is being transferred via [place] to [facility] to the oncology unit for continued care.  20 gauge IV remains intact to the right forearm.  Bruises to the arms bilaterally.  No new skin issues since admission.  Condom cath removed.  Albuterol neb tx started per patient request for shortness of breath prior to transport. All belongs was given to the wife of the patient.

## 2017-12-08 LAB — CULTURE, BLOOD (ROUTINE X 2)
CULTURE: NO GROWTH
Culture: NO GROWTH
Special Requests: ADEQUATE
Special Requests: ADEQUATE

## 2017-12-08 MED ORDER — GENERIC EXTERNAL MEDICATION
500.00 | Status: DC
Start: ? — End: 2017-12-08

## 2017-12-08 MED ORDER — IPRATROPIUM BROMIDE 0.02 % IN SOLN
500.00 | RESPIRATORY_TRACT | Status: DC
Start: 2017-12-08 — End: 2017-12-08

## 2017-12-08 MED ORDER — GENERIC EXTERNAL MEDICATION
60.00 | Status: DC
Start: ? — End: 2017-12-08

## 2017-12-08 MED ORDER — GENERIC EXTERNAL MEDICATION
1.00 | Status: DC
Start: 2017-12-09 — End: 2017-12-08

## 2017-12-08 MED ORDER — TRAMADOL HCL 50 MG PO TABS
50.00 | ORAL_TABLET | ORAL | Status: DC
Start: ? — End: 2017-12-08

## 2017-12-08 MED ORDER — ONDANSETRON HCL 8 MG PO TABS
4.00 | ORAL_TABLET | ORAL | Status: DC
Start: ? — End: 2017-12-08

## 2017-12-08 MED ORDER — MELATONIN 3 MG PO TABS
3.00 | ORAL_TABLET | ORAL | Status: DC
Start: 2017-12-08 — End: 2017-12-08

## 2017-12-08 MED ORDER — ACETAMINOPHEN 325 MG PO TABS
650.00 | ORAL_TABLET | ORAL | Status: DC
Start: ? — End: 2017-12-08

## 2017-12-08 MED ORDER — ALUM & MAG HYDROXIDE-SIMETH 400-400-40 MG/5ML PO SUSP
30.00 | ORAL | Status: DC
Start: ? — End: 2017-12-08

## 2017-12-08 MED ORDER — DIPHENHYDRAMINE HCL 25 MG PO CAPS
25.00 | ORAL_CAPSULE | ORAL | Status: DC
Start: ? — End: 2017-12-08

## 2017-12-08 MED ORDER — VALACYCLOVIR HCL 500 MG PO TABS
500.00 | ORAL_TABLET | ORAL | Status: DC
Start: 2017-12-09 — End: 2017-12-08

## 2017-12-08 MED ORDER — PANTOPRAZOLE SODIUM 40 MG PO TBEC
40.00 | DELAYED_RELEASE_TABLET | ORAL | Status: DC
Start: 2017-12-09 — End: 2017-12-08

## 2017-12-08 MED ORDER — GUAIFENESIN 100 MG/5ML PO SYRP
200.00 | ORAL_SOLUTION | ORAL | Status: DC
Start: ? — End: 2017-12-08

## 2017-12-08 MED ORDER — GENERIC EXTERNAL MEDICATION
1.00 | Status: DC
Start: ? — End: 2017-12-08

## 2017-12-08 MED ORDER — GENERIC EXTERNAL MEDICATION
150.00 | Status: DC
Start: 2017-12-09 — End: 2017-12-08

## 2017-12-08 MED ORDER — BIOTENE DRY MOUTH MOISTURIZING MT SOLN
1.00 | OROMUCOSAL | Status: DC
Start: ? — End: 2017-12-08

## 2017-12-08 MED ORDER — ALBUTEROL SULFATE (2.5 MG/3ML) 0.083% IN NEBU
2.50 | INHALATION_SOLUTION | RESPIRATORY_TRACT | Status: DC
Start: 2017-12-08 — End: 2017-12-08

## 2017-12-08 MED ORDER — ISAVUCONAZONIUM SULFATE 186 MG PO CAPS
372.00 | ORAL_CAPSULE | ORAL | Status: DC
Start: ? — End: 2017-12-08

## 2017-12-08 MED ORDER — TAMSULOSIN HCL 0.4 MG PO CAPS
0.40 | ORAL_CAPSULE | ORAL | Status: DC
Start: 2017-12-09 — End: 2017-12-08

## 2017-12-17 ENCOUNTER — Ambulatory Visit: Payer: Medicare Other | Admitting: Surgery

## 2017-12-17 ENCOUNTER — Other Ambulatory Visit: Payer: Medicare Other

## 2017-12-18 ENCOUNTER — Ambulatory Visit: Payer: Medicare Other | Admitting: Surgery

## 2017-12-22 ENCOUNTER — Telehealth: Payer: Self-pay | Admitting: *Deleted

## 2017-12-22 NOTE — Telephone Encounter (Signed)
Copied from [place] [id number]. Topic: Inquiry >> Dec 22, 2017  3:42 PM Margot Ables wrote: Reason for CRM: calling to f/u on [employer] for cpap supplies that pt has already received. It was faxed 12/16/17 to [phone]. Please call to advise status

## 2017-12-23 NOTE — Telephone Encounter (Signed)
Have you received this paperwork ?

## 2017-12-23 NOTE — Telephone Encounter (Signed)
I already signed something for CPAP supplies-?yesterday.  There is no pending paperwork in my folder for this.

## 2017-12-23 DEATH — deceased

## 2017-12-24 NOTE — Telephone Encounter (Signed)
No, I do not have it. Call originated from the [facility]. If Dr. [name] completed, it may in your blue folder or may have already been picked up for faxing/scanning by Margarett.  If not, would recommend calling [facility] and asking them to re-send. Contact info is at the bottom of the phone encounter.

## 2017-12-24 NOTE — Telephone Encounter (Signed)
The wife of this patient came in today and stated that he has recently deceased. Consoled wife and thanked her for letting us know.

## 2017-12-24 NOTE — Telephone Encounter (Signed)
I called the [facility] number that is listed below and spoke to [place] and [place] stated that there are no notes from them and the last note encounter was in July of 2019. Jasmine stated that they do not have any paperwork in regards to a cpap.

## 2017-12-24 NOTE — Telephone Encounter (Signed)
Do you happen to have this paperwork? I have asked Apolonio Schneiders and Joycelyn Schmid and we do not see the paperwork with the information to contact the company to check on the status.

## 2019-07-05 IMAGING — CR DG CHEST 2V
2 series · 2 of 2 positions shown · non-contrast
Comparison: Right ribs 08/22/2016, chest 07/19/2016

CLINICAL DATA: Chest pain and shortness of breath.

EXAM:
CHEST - 2 VIEW

[chest pa]
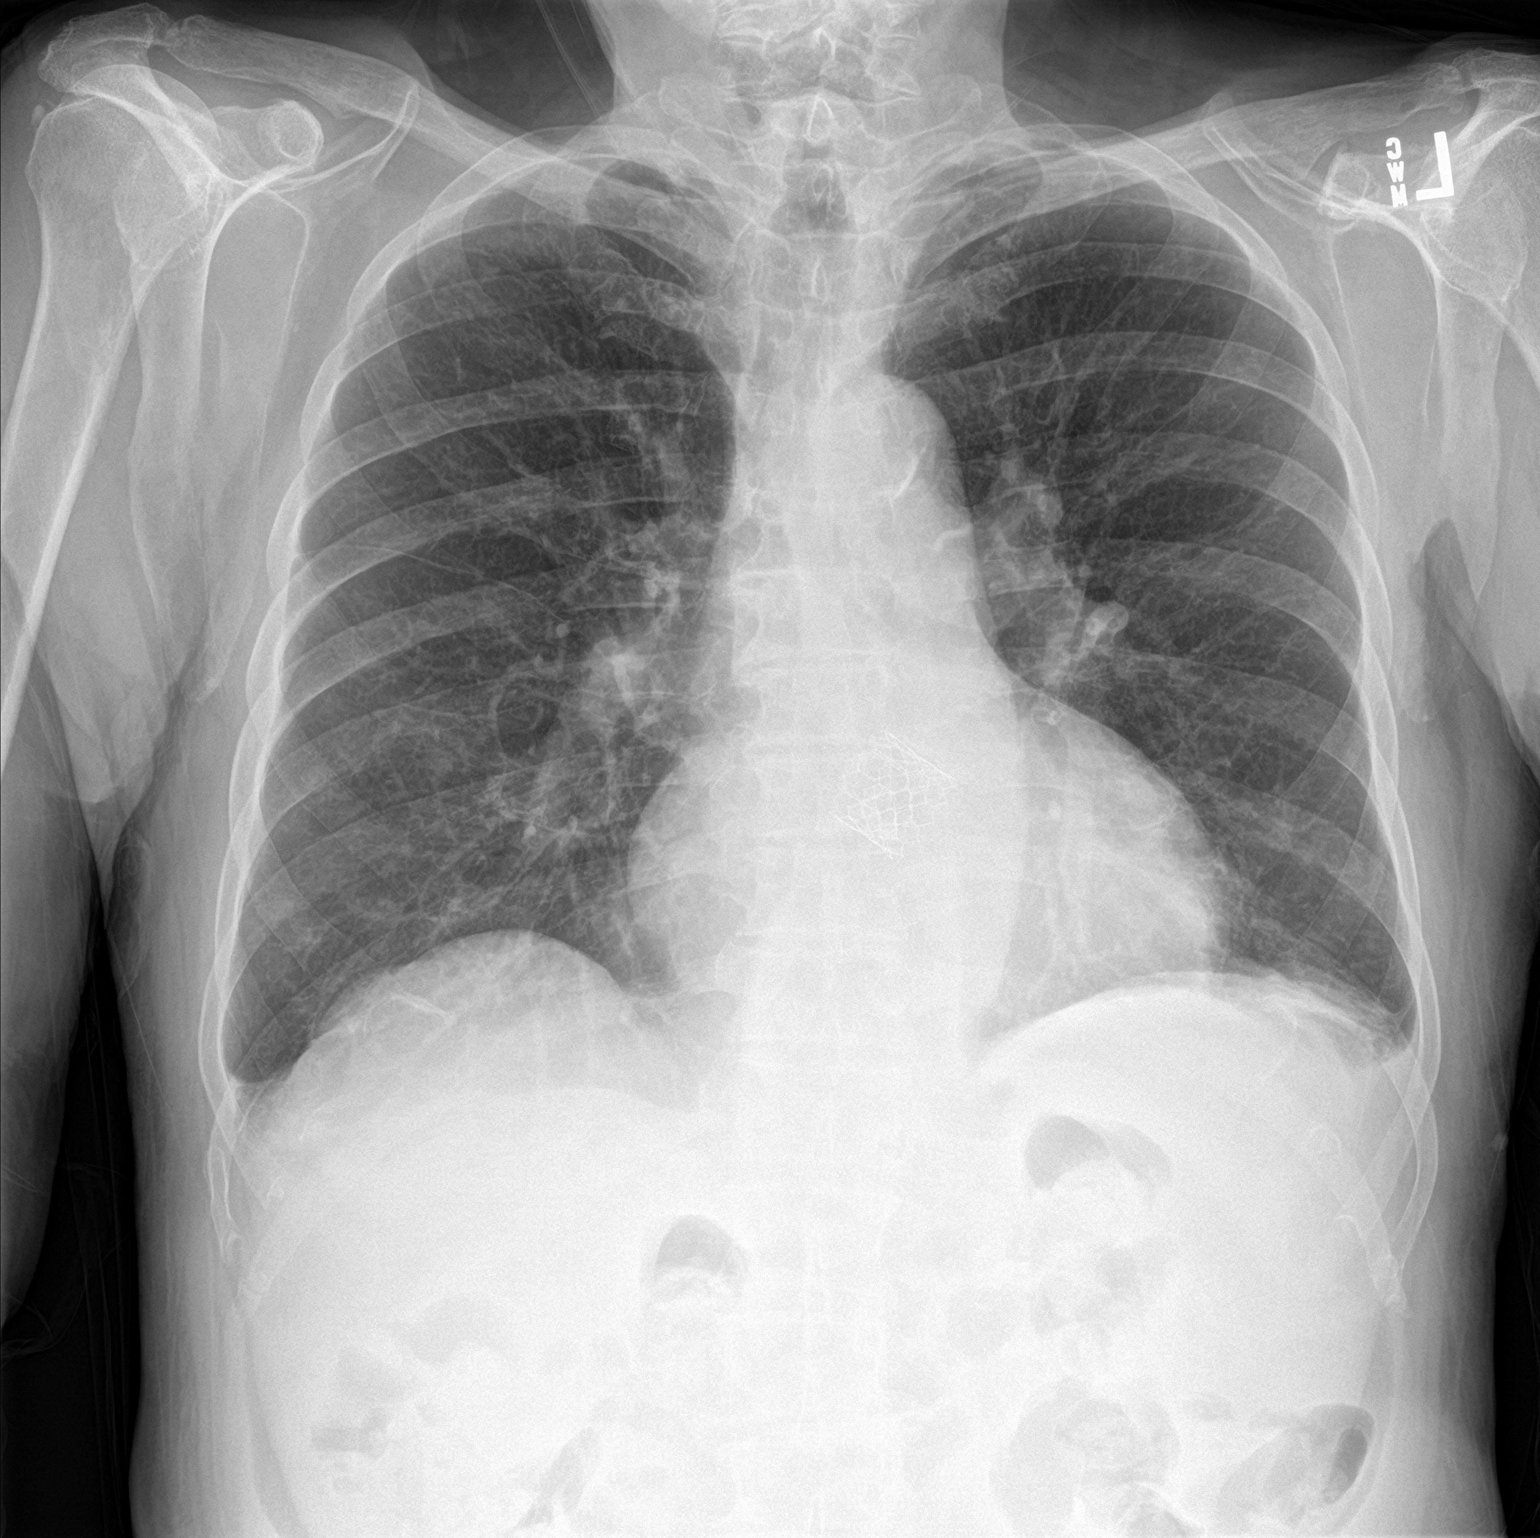

[chest lat]
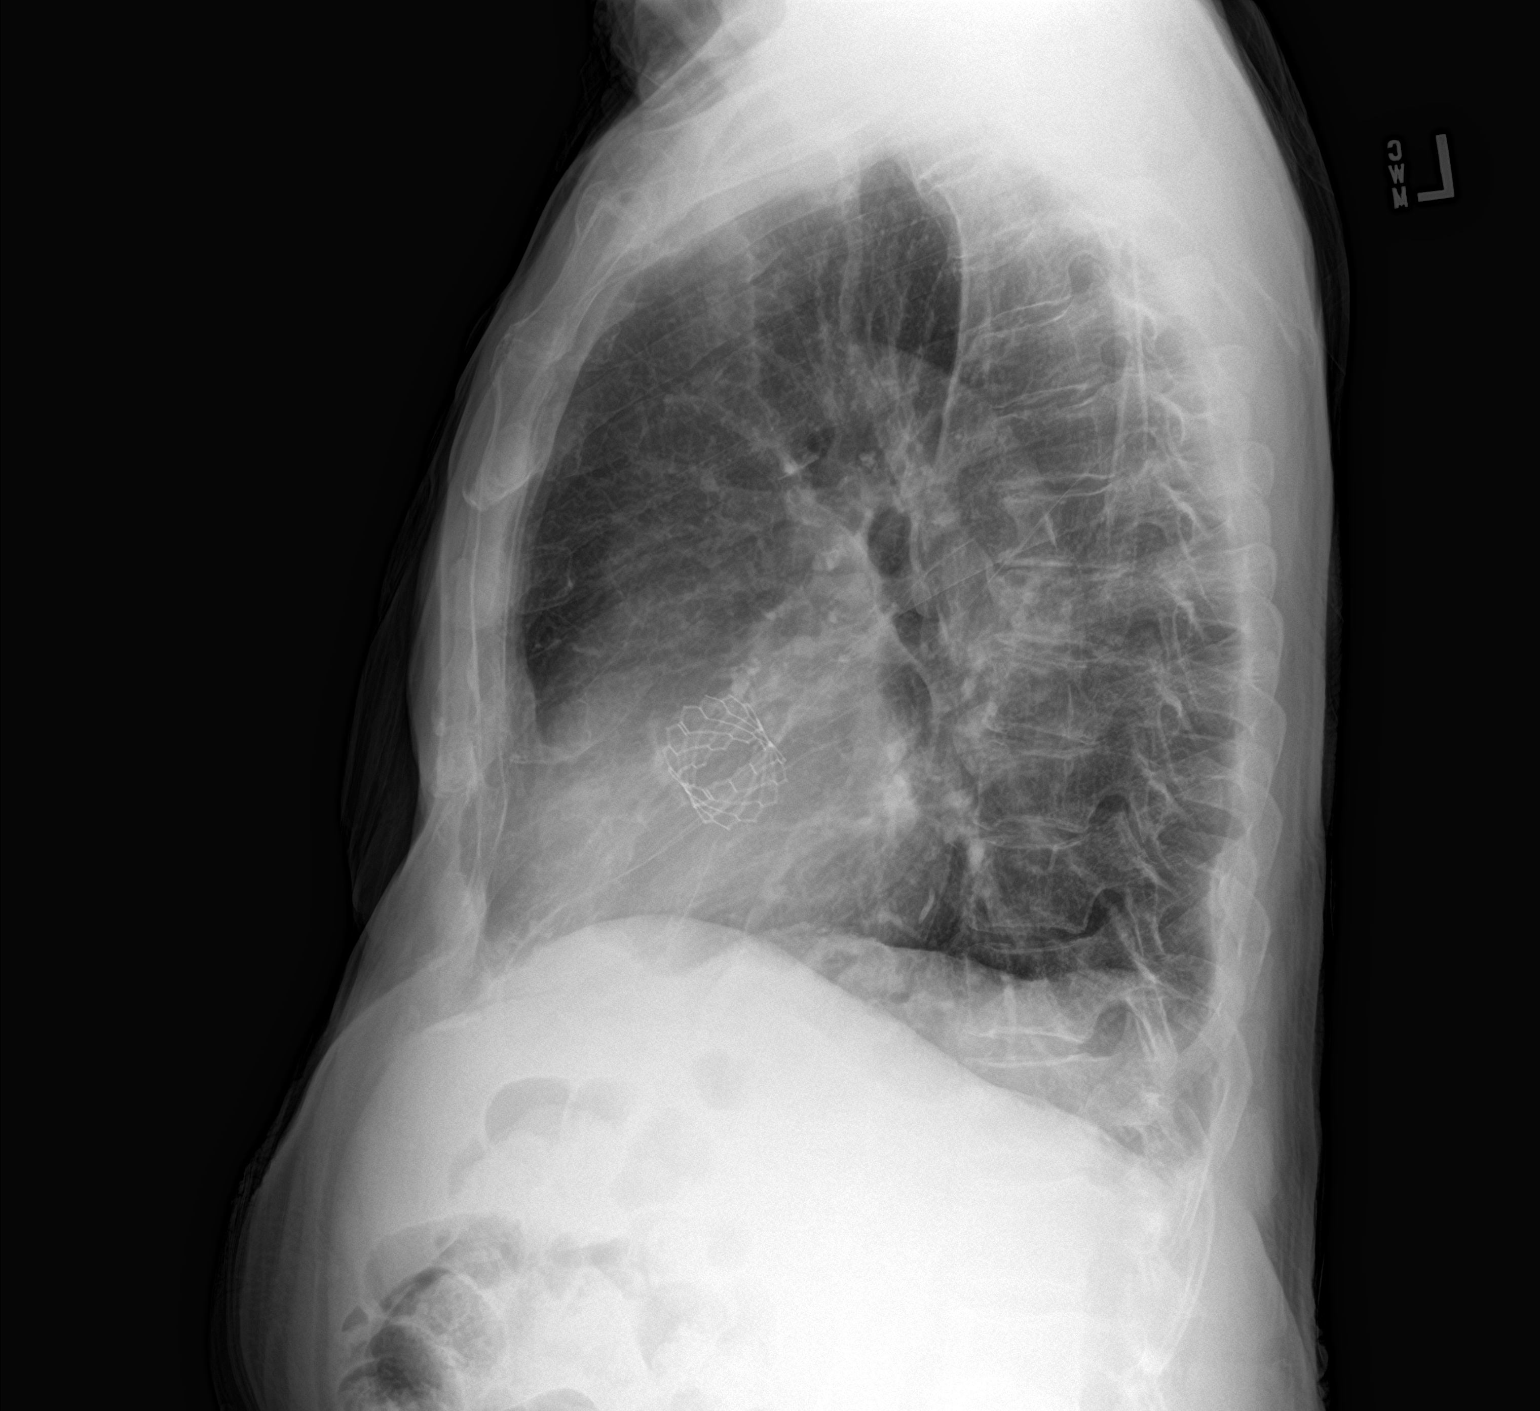

[2 of 2 positions shown; findings below may reference images not displayed]

FINDINGS: Borderline heart size with normal pulmonary vascularity. Cardiac
valve prosthesis. Slight fibrosis in the lung bases. No airspace
disease or consolidation. Blunting of the left costophrenic angle
may represent pleural thickening or a minimal pleural effusion. No
pneumothorax. Mediastinal contours appear intact. Calcification of
the aorta. Degenerative changes in the spine and shoulders.
IMPRESSION: Slight fibrosis in the lung bases. Fluid or thickened pleura
blunting the left costophrenic angle. No evidence of active
pulmonary disease.
